# Patient Record
Sex: Female | Born: 1935 | Race: White | Hispanic: No | Smoking: Never smoker
Health system: Southern US, Community
[De-identification: ages and names within clinical notes are randomized; demographics above are authoritative.]

## PROBLEM LIST (undated history)

## (undated) DIAGNOSIS — D649 Anemia, unspecified: Secondary | ICD-10-CM

## (undated) DIAGNOSIS — I10 Essential (primary) hypertension: Secondary | ICD-10-CM

## (undated) DIAGNOSIS — E785 Hyperlipidemia, unspecified: Secondary | ICD-10-CM

## (undated) DIAGNOSIS — G43009 Migraine without aura, not intractable, without status migrainosus: Secondary | ICD-10-CM

## (undated) DIAGNOSIS — M5432 Sciatica, left side: Secondary | ICD-10-CM

## (undated) DIAGNOSIS — M1990 Unspecified osteoarthritis, unspecified site: Secondary | ICD-10-CM

## (undated) DIAGNOSIS — C801 Malignant (primary) neoplasm, unspecified: Secondary | ICD-10-CM

## (undated) DIAGNOSIS — R251 Tremor, unspecified: Secondary | ICD-10-CM

## (undated) DIAGNOSIS — E119 Type 2 diabetes mellitus without complications: Secondary | ICD-10-CM

## (undated) DIAGNOSIS — I471 Supraventricular tachycardia, unspecified: Secondary | ICD-10-CM

## (undated) DIAGNOSIS — F0390 Unspecified dementia without behavioral disturbance: Secondary | ICD-10-CM

## (undated) DIAGNOSIS — I214 Non-ST elevation (NSTEMI) myocardial infarction: Secondary | ICD-10-CM

## (undated) DIAGNOSIS — L8990 Pressure ulcer of unspecified site, unspecified stage: Secondary | ICD-10-CM

## (undated) DIAGNOSIS — G20 Parkinson's disease: Secondary | ICD-10-CM

## (undated) DIAGNOSIS — F0280 Dementia in other diseases classified elsewhere without behavioral disturbance: Secondary | ICD-10-CM

## (undated) DIAGNOSIS — G20A1 Parkinson's disease without dyskinesia, without mention of fluctuations: Secondary | ICD-10-CM

## (undated) HISTORY — DX: Parkinson's disease: G20

## (undated) HISTORY — PX: TONSILLECTOMY: SUR1361

## (undated) HISTORY — DX: Pressure ulcer of unspecified site, unspecified stage: L89.90

## (undated) HISTORY — PX: OTHER SURGICAL HISTORY: SHX169

## (undated) HISTORY — DX: Dementia in other diseases classified elsewhere without behavioral disturbance: F02.80

## (undated) HISTORY — DX: Migraine without aura, not intractable, without status migrainosus: G43.009

## (undated) HISTORY — DX: Hyperlipidemia, unspecified: E78.5

## (undated) HISTORY — DX: Type 2 diabetes mellitus without complications: E11.9

## (undated) HISTORY — PX: MOUTH SURGERY: SHX715

## (undated) HISTORY — PX: APPENDECTOMY: SHX54

## (undated) HISTORY — DX: Sciatica, left side: M54.32

## (undated) HISTORY — DX: Tremor, unspecified: R25.1

---

## 1998-08-28 ENCOUNTER — Other Ambulatory Visit: Admission: RE | Admit: 1998-08-28 | Discharge: 1998-08-28 | Payer: Self-pay | Admitting: Internal Medicine

## 1999-02-10 ENCOUNTER — Other Ambulatory Visit: Admission: RE | Admit: 1999-02-10 | Discharge: 1999-02-10 | Payer: Self-pay | Admitting: Podiatry

## 1999-09-16 ENCOUNTER — Encounter: Payer: Self-pay | Admitting: Internal Medicine

## 1999-09-16 ENCOUNTER — Encounter: Admission: RE | Admit: 1999-09-16 | Discharge: 1999-09-16 | Payer: Self-pay | Admitting: Internal Medicine

## 2000-05-26 ENCOUNTER — Encounter: Payer: Self-pay | Admitting: Internal Medicine

## 2000-05-26 ENCOUNTER — Ambulatory Visit (HOSPITAL_COMMUNITY): Admission: RE | Admit: 2000-05-26 | Discharge: 2000-05-26 | Payer: Self-pay | Admitting: Internal Medicine

## 2000-10-12 ENCOUNTER — Ambulatory Visit (HOSPITAL_COMMUNITY): Admission: RE | Admit: 2000-10-12 | Discharge: 2000-10-12 | Payer: Self-pay | Admitting: Gastroenterology

## 2001-01-17 ENCOUNTER — Encounter: Admission: RE | Admit: 2001-01-17 | Discharge: 2001-01-17 | Payer: Self-pay | Admitting: Internal Medicine

## 2001-01-17 ENCOUNTER — Encounter: Payer: Self-pay | Admitting: Internal Medicine

## 2002-09-12 ENCOUNTER — Emergency Department (HOSPITAL_COMMUNITY): Admission: EM | Admit: 2002-09-12 | Discharge: 2002-09-13 | Payer: Self-pay | Admitting: Emergency Medicine

## 2003-08-30 ENCOUNTER — Encounter: Admission: RE | Admit: 2003-08-30 | Discharge: 2003-08-30 | Payer: Self-pay | Admitting: Internal Medicine

## 2003-09-23 ENCOUNTER — Other Ambulatory Visit: Admission: RE | Admit: 2003-09-23 | Discharge: 2003-09-23 | Payer: Self-pay | Admitting: *Deleted

## 2004-01-21 ENCOUNTER — Encounter (HOSPITAL_COMMUNITY): Admission: RE | Admit: 2004-01-21 | Discharge: 2004-02-07 | Payer: Self-pay | Admitting: Orthopedic Surgery

## 2004-09-24 ENCOUNTER — Emergency Department (HOSPITAL_COMMUNITY): Admission: EM | Admit: 2004-09-24 | Discharge: 2004-09-24 | Payer: Self-pay | Admitting: *Deleted

## 2009-01-06 ENCOUNTER — Encounter: Admission: RE | Admit: 2009-01-06 | Discharge: 2009-01-06 | Payer: Self-pay | Admitting: Internal Medicine

## 2010-09-25 NOTE — Procedures (Signed)
[place]  Patient:    Catherine [name], Catherine [name]                       MRN: [id number] Proc. Date: 10/12/00 Adm. Date:  [id number] Attending:  Charna Elizabeth CC:         Merlene Laughter. Renae Gloss, M.D.   Procedure Report  DATE OF BIRTH:  Dec 30, 1927.  REFERRING PHYSICIAN:  Merlene Laughter. Renae Gloss, M.D.  PROCEDURE PERFORMED:  Colonoscopy.  ENDOSCOPIST:  Anselmo Rod, M.D.  INSTRUMENT USED:  Olympus video colonoscope.  INDICATIONS FOR PROCEDURE:  The patient is a 77 year old white female with a history of polyps rule out recurrent polyps.  PREPROCEDURE PREPARATION:  Informed consent was procured from the patient. The patient was fasted for eight hours prior to the procedure and prepped with a bottle of magnesium citrate and a gallon of NuLytely the night prior to the procedure.  PREPROCEDURE PHYSICAL:  The patient had stable vital signs.  Neck supple. Chest clear to auscultation.  S1, S2 regular.  Abdomen soft with normal abdominal bowel sounds.  DESCRIPTION OF PROCEDURE:  The patient was placed in the left lateral decubitus position and sedated with 30 mg of Demerol and 5 mg of Versed intravenously.  Once the patient was adequately sedated and maintained on low-flow oxygen and continuous cardiac monitoring, the Olympus video colonoscope was advanced from the rectum to the cecum without difficulty.  The entire colonic mucosa appeared healthy with a normal vascular pattern. No erosions, ulcerations, masses, polyps or diverticula were seen.  The appendicular orifice and the ileocecal valve were clearly visualized and photographed.  IMPRESSION:  Normal colonoscopy.  RECOMMENDATIONS:  Repeat colorectal screening is recommended in the next 10 years unless the patient were to develop any abnormal symptoms in the interim.DD:  10/12/00 TD:  10/12/00 Job: [id number]

## 2011-07-21 ENCOUNTER — Ambulatory Visit: Payer: Self-pay | Admitting: Family Medicine

## 2011-07-21 VITALS — BP 124/76 | HR 80 | Temp 98.1°F | Resp 16 | Ht 60.5 in | Wt 174.0 lb

## 2011-07-21 DIAGNOSIS — E119 Type 2 diabetes mellitus without complications: Secondary | ICD-10-CM | POA: Insufficient documentation

## 2011-07-21 DIAGNOSIS — J40 Bronchitis, not specified as acute or chronic: Secondary | ICD-10-CM

## 2011-07-21 DIAGNOSIS — R059 Cough, unspecified: Secondary | ICD-10-CM

## 2011-07-21 DIAGNOSIS — E785 Hyperlipidemia, unspecified: Secondary | ICD-10-CM | POA: Insufficient documentation

## 2011-07-21 DIAGNOSIS — R05 Cough: Secondary | ICD-10-CM

## 2011-07-21 MED ORDER — AZITHROMYCIN 250 MG PO TABS: ORAL_TABLET | ORAL | Status: AC

## 2011-07-21 MED ORDER — HYDROCODONE-HOMATROPINE 5-1.5 MG/5ML PO SYRP: 5.0000 mL | ORAL_SOLUTION | Freq: Three times a day (TID) | ORAL | Status: DC | PRN

## 2011-07-21 NOTE — Patient Instructions (Signed)
Stop the ramipril

## 2011-07-21 NOTE — Progress Notes (Signed)
78 yo woman with 3 and 1/2 months of cough, hoarseness, and sinus congestion.  Also has "pulling" in chest.  Nonproductive cough.  Has been to see her doctor several times, the last time was given cipro and shot of prednisone.  She has had an cxr which was "clear".  No past hx of pneumonia or prolonged cough in the past.  No GERD sx.  O:  NAD, alert and cooperative HEENT:  Unremarkable Neck: supple, no adenopathy Chest:  Wheezing. Heart:  Loud S2, no murmur Ext: no edema Parkinson type tremor right arm.  A:  Persistent cough.  May be related to the ramipril.  May be pertussis  P:  Z pak Hydromet Stop the ramipril.

## 2011-07-23 ENCOUNTER — Ambulatory Visit: Payer: Self-pay | Admitting: Family Medicine

## 2011-07-23 VITALS — BP 113/71 | HR 83 | Temp 98.1°F | Resp 16 | Ht 60.0 in | Wt 172.6 lb

## 2011-07-23 DIAGNOSIS — J209 Acute bronchitis, unspecified: Secondary | ICD-10-CM

## 2011-07-23 DIAGNOSIS — J40 Bronchitis, not specified as acute or chronic: Secondary | ICD-10-CM

## 2011-07-23 NOTE — Progress Notes (Signed)
78 yo woman who is here for a recheck on her cough.  She is sleeping well on the cough syrup.  The wheezing is less, but the cough continues.  Some sinus congestion persists.  She notes some mild left upper abdominal soreness today, but bowels are functioning normally.  O:  TM's old scars but no changes, Oroph clear (upper dentures), Neck supple no adenopathy NAD Chest:  No wheezes or rales Heart: S2 is loud, no murmur Ext:  No edema, tremor right arm  A:  Overall small improvement, I believe she will recover with current management  P:  Recheck in 4 days.

## 2011-07-27 ENCOUNTER — Ambulatory Visit (INDEPENDENT_AMBULATORY_CARE_PROVIDER_SITE_OTHER): Payer: Self-pay | Admitting: Family Medicine

## 2011-07-27 VITALS — BP 136/63 | HR 80 | Temp 98.3°F | Resp 16 | Ht 60.0 in | Wt 173.0 lb

## 2011-07-27 DIAGNOSIS — R05 Cough: Secondary | ICD-10-CM

## 2011-07-27 DIAGNOSIS — R059 Cough, unspecified: Secondary | ICD-10-CM

## 2011-07-27 DIAGNOSIS — J42 Unspecified chronic bronchitis: Secondary | ICD-10-CM

## 2011-07-27 MED ORDER — CLONAZEPAM 0.5 MG PO TABS: 0.2500 mg | ORAL_TABLET | Freq: Every evening | ORAL | Status: AC | PRN

## 2011-07-27 MED ORDER — METHYLPREDNISOLONE 4 MG PO TABS: ORAL_TABLET | ORAL | Status: DC

## 2011-07-27 NOTE — Progress Notes (Signed)
78 yo woman with persistent cough and wheeze.  No fevers. Patient notes tremor R hand intermittently and anxiety, usually in evening, which worsens the tremor on the right hand.  O:  HEENT unremarkable Intermittent coarse 3/sec right forearm tremor Chest:  Few rales right sided, faint wheeze right.  A:  Persisting bronchitis Mild rue tremor Anxiety  P:  Clonazepam .25 mg qhs Medrol 4 mg ii daily for 2 days, i daily for 3 days.

## 2011-07-30 ENCOUNTER — Telehealth: Payer: Self-pay

## 2011-07-30 NOTE — Telephone Encounter (Signed)
Pt is still coughing and not feeling any better she wants to know if she needs she said she is only following Dr Armond Hang orders he told her to call, if she needs to come out she wants to come out before it gets dark.

## 2011-07-31 ENCOUNTER — Other Ambulatory Visit: Payer: Self-pay | Admitting: Family Medicine

## 2011-07-31 NOTE — Telephone Encounter (Signed)
Patient states she was told to call Dr. Milus Glazier. She called yesterday and never heard back. Please call [phone]

## 2011-08-01 NOTE — Telephone Encounter (Signed)
PLEASE ADVISE.

## 2011-08-01 NOTE — Telephone Encounter (Signed)
[facility] ADVISING PT OF NOTE

## 2011-08-01 NOTE — Telephone Encounter (Signed)
I have been extremely busy.  Please have her come back either this afternoon to see another provider, or tomorrow morning to see me.  I have not been feeling well myself and need to go home now.

## 2012-09-20 ENCOUNTER — Encounter: Payer: Self-pay | Admitting: Neurology

## 2012-09-20 ENCOUNTER — Ambulatory Visit (INDEPENDENT_AMBULATORY_CARE_PROVIDER_SITE_OTHER): Payer: Medicare Other | Admitting: Neurology

## 2012-09-20 VITALS — BP 144/68 | HR 90 | Ht 61.0 in | Wt 174.0 lb

## 2012-09-20 DIAGNOSIS — G20A1 Parkinson's disease without dyskinesia, without mention of fluctuations: Secondary | ICD-10-CM

## 2012-09-20 DIAGNOSIS — G20 Parkinson's disease: Secondary | ICD-10-CM

## 2012-09-20 HISTORY — DX: Parkinson's disease: G20

## 2012-09-20 HISTORY — DX: Parkinson's disease without dyskinesia, without mention of fluctuations: G20.A1

## 2012-09-20 NOTE — Progress Notes (Signed)
Reason for visit: Tremor  Catherine [name] is a 79 y.o. female  History of present illness:  Catherine [name] is an 79 year old right-handed white female with a history of a tremor that is consistent with Parkinson's disease. The tremor mainly affects the right arm. The patient will have good days and bad days with the tremor. Over the last one year, the patient does not believe that there has been any significant changes in her underlying functional level. The patient has had some sciatica issues involving the left leg, but this has improved with physical therapy. The patient denies any falls. The patient indicates that when she is actively doing something with the right arm, the tremor disappears. In the past, the patient indicates that she has had a skin rash to Sinemet and Azilect. The patient is on no medications for Parkinson's disease at this time. The patient returns for an evaluation.  Past Medical History  Diagnosis Date  . Tremor   . Paralysis agitans 09/20/2012  . Sciatica of left side   . Obese   . Diabetes mellitus without complication   . Dyslipidemia   . Migraine without aura, without mention of intractable migraine without mention of status migrainosus     Past Surgical History  Procedure Laterality Date  . Appendectomy    . Basal cell carcinoma resection      Forehead  . Mouth surgery    . Tonsillectomy    . Arthroscopic surgery      Left knee  . Trigger finger surgery      Bilateral thumb    Family History  Problem Relation Age of Onset  . Heart attack Father     Social history:  reports that she has never smoked. She does not have any smokeless tobacco history on file. She reports that she does not drink alcohol or use illicit drugs.  Medications:  Current Outpatient Prescriptions on File Prior to Visit  Medication Sig Dispense Refill  . simvastatin (ZOCOR) 40 MG tablet Take 40 mg by mouth every evening.       No current facility-administered medications on  file prior to visit.    Allergies:  Allergies  Allergen Reactions  . Sulfa Antibiotics Rash  . Azilect (Rasagiline) Rash  . Fenofibrate Rash  . Sinemet (Carbidopa-Levodopa) Rash    ROS:  Out of a complete 14 system review of symptoms, the patient complains only of the following symptoms, and all other reviewed systems are negative.   Tremor   Blood pressure 144/68, pulse 90, height 5\' 1"  (1.549 m), weight 174 lb (78.926 kg).  Physical Exam  General: The patient is alert and cooperative at the time of the examination. The patient is moderately obese.  Skin: Extremities are without significant edema.  Neurologic Exam  Cranial nerves: Facial symmetry is present. There is good sensation of the face to pinprick and soft touch bilaterally. The strength of the facial muscles and the muscles to head turning and shoulder shrug are normal bilaterally. Speech is well enunciated, no aphasia or dysarthria is noted. Extraocular movements are full. Visual fields are full. Masking of the face is seen.  Motor: The motor testing reveals 5 over 5 strength of all 4 extremities. Good symmetric motor tone is noted throughout.  Coordination: Cerebellar testing reveals good finger-nose-finger and heel-to-shin bilaterally. The patient has a resting tremor affecting the right upper extremity.   Gait and station: Gait is normal. Decreased arm swing is noted bilaterally, and a tremor is noted  with the right upper extremity while walking. The patient is able to arise from a seated position with the arms crossed.  Tandem gait is normal. Romberg is negative. No drift is seen.  Reflexes: Deep tendon reflexes are symmetric and normal bilaterally.    Assessment/Plan:   One. Probable Parkinson's disease  The patient likely has Parkinson's disease primarily with right upper extremity tremor. The patient however appears to be progressing very slowly. The patient indicates that she has allergies to Sinemet,  and therefore treatment of Parkinson's disease in the future may be difficult. The patient will be followed conservatively. The patient will come back in about one year.   Marlan Palau MD 09/20/2012 7:41 PM  [facility] [address] [place], [place] [address]  Phone [phone] Fax [phone]

## 2013-01-11 ENCOUNTER — Ambulatory Visit: Payer: Self-pay | Admitting: Cardiology

## 2013-01-11 ENCOUNTER — Other Ambulatory Visit: Payer: Self-pay | Admitting: *Deleted

## 2013-01-11 ENCOUNTER — Encounter: Payer: Self-pay | Admitting: Internal Medicine

## 2013-01-11 ENCOUNTER — Ambulatory Visit (INDEPENDENT_AMBULATORY_CARE_PROVIDER_SITE_OTHER): Payer: Medicare Other | Admitting: Internal Medicine

## 2013-01-11 VITALS — BP 140/74 | HR 62 | Ht 60.0 in | Wt 174.7 lb

## 2013-01-11 DIAGNOSIS — R002 Palpitations: Secondary | ICD-10-CM

## 2013-01-11 NOTE — Progress Notes (Signed)
OFFICE NOTE  Chief Complaint:  Heart fluttering  Primary Care Physician: Eartha Inch, MD  HPI:  Catherine [name] is a pleasant 79 year old female with a history of diabetes, dyslipidemia, and hypertension, as well as upper extremity tremor and diagnosis of paralysis agitans. She sees Dr. Anne Hahn for this. She was previously tried on Sinemet and Azliect, but had side effects from those medications. She reports that she's been having fluttering in her chest for several years, but it has become worse recently. The symptoms are present nearly every day and are very short lived. She is concerned that they may be due to stress, but is also concerned about heart disease due to her family history. She was previously taking Inderal, which may have been helping her palpitations and her tremor to some extent. For unknown reasons, she is not currently taking that medication. She denies any chest pain or worsening shortness of breath with exertion.  PMHx:  Past Medical History  Diagnosis Date  . Tremor   . Paralysis agitans 09/20/2012  . Sciatica of left side   . Obese   . Diabetes mellitus without complication   . Dyslipidemia   . Migraine without aura, without mention of intractable migraine without mention of status migrainosus     Past Surgical History  Procedure Laterality Date  . Appendectomy    . Basal cell carcinoma resection      Forehead  . Mouth surgery    . Tonsillectomy    . Arthroscopic surgery      Left knee  . Trigger finger surgery      Bilateral thumb    FAMHx:  Family History  Problem Relation Age of Onset  . Heart attack Father     not certain of COD  . Rheum arthritis Mother   . Hypertension Paternal Grandfather   . Stroke Paternal Grandfather   . Cirrhosis Paternal Grandmother     SOCHx:   reports that she has never smoked. She has never used smokeless tobacco. She reports that she does not drink alcohol or use illicit drugs.  ALLERGIES:  Allergies    Allergen Reactions  . Sulfa Antibiotics Rash  . Azilect [Rasagiline] Rash  . Fenofibrate Rash  . Sinemet [Carbidopa-Levodopa] Rash    ROS: A comprehensive review of systems was negative except for: Cardiovascular: positive for palpitations Neurological: positive for gait problems and tremors  HOME MEDS: Current Outpatient Prescriptions  Medication Sig Dispense Refill  . Alogliptin-Metformin HCl (KAZANO) 12.09-998 MG TABS Take 1 tablet by mouth daily.       Marland Kitchen aspirin 81 MG chewable tablet Chew 81 mg by mouth daily.      Marland Kitchen LORazepam (ATIVAN) 0.5 MG tablet Take 0.5 mg by mouth 2 (two) times daily.      . Multiple Vitamin (MULTIVITAMIN) tablet Take 1 tablet by mouth. Takes occasionally      . RAMIPRIL PO Take 2.5 mg by mouth daily.       . propranolol (INDERAL) 10 MG tablet Take 10 mg by mouth 3 (three) times daily.      . simvastatin (ZOCOR) 40 MG tablet Take 20 mg by mouth every evening.        No current facility-administered medications for this visit.    LABS/IMAGING: No results found for this or any previous visit (from the past 48 hour(s)). No results found.  VITALS: BP 140/74  Pulse 62  Ht 5' (1.524 m)  Wt 174 lb 11.2 oz (79.243 kg)  BMI 34.12  kg/m2  EXAM: General appearance: alert, no distress and possible masked facies Neck: no adenopathy, no carotid bruit, no JVD, supple, symmetrical, trachea midline and thyroid not enlarged, symmetric, no tenderness/mass/nodules Lungs: clear to auscultation bilaterally Heart: regular rate and rhythm, S1, S2 normal, no murmur, click, rub or gallop Abdomen: soft, non-tender; bowel sounds normal; no masses,  no organomegaly and obese Extremities: edema trace edema R>L, reticular veins Pulses: 2+ and symmetric Skin: Skin color, texture, turgor normal. No rashes or lesions Neurologic: Grossly normal  EKG: Sinus rhythm at 62  ASSESSMENT: 1. Palpitations 2. Paralysis agitans (AKA Parkinson's disorder) 3. Dyslipidemia -  controlled 4. Hypertension - controlled  PLAN: 1.   Mrs. Flight has been having some palpitations for years. She was previously on Inderal possibly for her tremor and/or palpitations, but for some reason he stopped taking this. She reports her palpitations occurred nearly every day and thinks they may be due to stress. I recommended a seven-day event monitor. Based on these findings, we may recommend restarting her Inderal. She does not believe she has Parkinson's disease, however her symptoms do suggest this. It is not common for palpitations to coexist with that disorder. We'll plan to see her back in 2 weeks to review the results of her monitor.  Chrystie Nose, MD, [facility] Attending Cardiologist [facility]  HILTY,Kenneth C 01/11/2013, 11:52 AM

## 2013-01-11 NOTE — Patient Instructions (Addendum)
Your physician has recommended that you wear an event monitor. Event monitors are medical devices that record the heart's electrical activity. Doctors most often [place] these monitors to diagnose arrhythmias. Arrhythmias are problems with the speed or rhythm of the heartbeat. The monitor is a small, portable device. You can wear one while you do your normal daily activities. This is usually used to diagnose what is causing palpitations/syncope (passing out). You will wear this for 7 days.   Dr Rennis Golden would like you to follow up in about 2-3 weeks, after you wear your monitor.

## 2013-01-15 ENCOUNTER — Encounter: Payer: Self-pay | Admitting: Internal Medicine

## 2013-01-25 ENCOUNTER — Encounter: Payer: Self-pay | Admitting: Internal Medicine

## 2013-01-25 ENCOUNTER — Ambulatory Visit (INDEPENDENT_AMBULATORY_CARE_PROVIDER_SITE_OTHER): Payer: Medicare Other | Admitting: Internal Medicine

## 2013-01-25 VITALS — BP 116/60 | HR 76 | Ht 60.0 in | Wt 172.2 lb

## 2013-01-25 DIAGNOSIS — R002 Palpitations: Secondary | ICD-10-CM

## 2013-01-25 DIAGNOSIS — I471 Supraventricular tachycardia: Secondary | ICD-10-CM

## 2013-01-25 MED ORDER — PROPRANOLOL HCL ER 60 MG PO CP24: 60.0000 mg | ORAL_CAPSULE | Freq: Every day | ORAL | Status: DC

## 2013-01-25 NOTE — Patient Instructions (Addendum)
Your physician has recommended you make the following change in your medication: START PROPRANOLOL 60mg  DAILY.   Follow up with Dr. Rennis Golden as needed.

## 2013-01-25 NOTE — Progress Notes (Signed)
OFFICE NOTE  Chief Complaint:  Heart fluttering  Primary Care Physician: Catherine Inch, MD  HPI:  Catherine [name] is a pleasant 79 year old female with a history of diabetes, dyslipidemia, and hypertension, as well as upper extremity tremor and diagnosis of paralysis agitans. She sees Dr. Anne [name] for this. She was previously tried on Sinemet and Azliect, but had side effects from those medications. She reports that she's been having fluttering in her chest for several years, but it has become worse recently. The symptoms are present nearly every day and are very short lived. She is concerned that they may be due to stress, but is also concerned about heart disease due to her family history. She was previously taking Inderal, which may have been helping her palpitations and her tremor to some extent. For unknown reasons, she is not currently taking that medication. She denies any chest pain or worsening shortness of breath with exertion. She underwent event monitoring from 01/11/2013 to 01/17/2013. There were numerous episodes of palpitations which do not correlate with extra beats. However, she did have one episode with a skipped beat and shortness of breath on 01/14/2013 which was associated with a brief 4-5 beat atrial run.  PMHx:  Past Medical History  Diagnosis Date  . Tremor   . Paralysis agitans 09/20/2012  . Sciatica of left side   . Obese   . Diabetes mellitus without complication   . Dyslipidemia   . Migraine without aura, without mention of intractable migraine without mention of status migrainosus     Past Surgical History  Procedure Laterality Date  . Appendectomy    . Basal cell carcinoma resection      Forehead  . Mouth surgery    . Tonsillectomy    . Arthroscopic surgery      Left knee  . Trigger finger surgery      Bilateral thumb    FAMHx:  Family History  Problem Relation Age of Onset  . Heart attack Father     not certain of COD  . Rheum arthritis  Mother   . Hypertension Paternal Grandfather   . Stroke Paternal Grandfather   . Cirrhosis Paternal Grandmother     SOCHx:   reports that she has never smoked. She has never used smokeless tobacco. She reports that she does not drink alcohol or use illicit drugs.  ALLERGIES:  Allergies  Allergen Reactions  . Sulfa Antibiotics Rash  . Azilect [Rasagiline] Rash  . Fenofibrate Rash  . Sinemet [Carbidopa-Levodopa] Rash    ROS: A comprehensive review of systems was negative except for: Cardiovascular: positive for palpitations Neurological: positive for gait problems and tremors  HOME MEDS: Current Outpatient Prescriptions  Medication Sig Dispense Refill  . propranolol ER (INDERAL LA) 60 MG 24 hr capsule Take 1 capsule (60 mg total) by mouth daily.  30 capsule  11  . Alogliptin-Metformin HCl (KAZANO) 12.09-998 MG TABS Take 1 tablet by mouth daily.       Marland Kitchen aspirin 81 MG chewable tablet Chew 81 mg by mouth daily.      Marland Kitchen LORazepam (ATIVAN) 0.5 MG tablet Take 0.5 mg by mouth 2 (two) times daily.      . Multiple Vitamin (MULTIVITAMIN) tablet Take 1 tablet by mouth. Takes occasionally      . RAMIPRIL PO Take 2.5 mg by mouth daily.       . simvastatin (ZOCOR) 40 MG tablet Take 20 mg by mouth every evening.  No current facility-administered medications for this visit.    LABS/IMAGING: No results found for this or any previous visit (from the past 48 hour(s)). No results found.  VITALS: BP 116/60  Pulse 76  Ht 5' (1.524 m)  Wt 172 lb 3.2 oz (78.109 kg)  BMI 33.63 kg/m2  EXAM: Deferred  EKG: deferred  ASSESSMENT: 1. Palpitations - secondary to PAT 2. Paralysis agitans (AKA Parkinson's disorder) 3. Dyslipidemia - controlled 4. Hypertension - controlled  PLAN: 1.   Catherine [name] has been having some palpitations for years. Her monitor demonstrates brief runs of PAT. Although she is having symptoms not associated with arrythmia on the monitor, I suspect she has both  atrial arrhythmias which are short-lived as well as symptoms related to her Parkinson's. Nevertheless, this should be easily mask with a rate of blocker. She previously took short-acting Inderal but stopped for unclear reasons. I think she would do well and be more compliant with a long-acting low-dose beta blocker such as propranolol ER. We will going to start this and she should be aware of any untoward side effects. Plan to followup as needed.  Catherine Nose, MD, [facility] Attending Cardiologist [facility]  Catherine [name] 01/25/2013, 2:49 PM

## 2013-09-20 ENCOUNTER — Ambulatory Visit (INDEPENDENT_AMBULATORY_CARE_PROVIDER_SITE_OTHER): Payer: Medicare Other | Admitting: Neurology

## 2013-09-20 ENCOUNTER — Encounter: Payer: Self-pay | Admitting: Neurology

## 2013-09-20 ENCOUNTER — Encounter (INDEPENDENT_AMBULATORY_CARE_PROVIDER_SITE_OTHER): Payer: Self-pay

## 2013-09-20 VITALS — BP 149/75 | HR 81 | Wt 160.0 lb

## 2013-09-20 DIAGNOSIS — G20 Parkinson's disease: Secondary | ICD-10-CM

## 2013-09-20 MED ORDER — PRAMIPEXOLE DIHYDROCHLORIDE 0.125 MG PO TABS
0.1250 mg | ORAL_TABLET | Freq: Three times a day (TID) | ORAL | Status: DC
Start: 2013-09-20 — End: 2013-11-05

## 2013-09-20 NOTE — Progress Notes (Signed)
Reason for visit: Parkinson's disease  Catherine [name] is an 80 y.o. female  History of present illness:  Catherine [name] is an 80 year old right-handed white female with a history of Parkinson's disease primarily with right-sided features. Over the last year, she has gradually worsened with her parkinsonism. She indicates that she fell 2 weeks ago, and she was unable to get up by herself. She had to call a neighbor to help her get up on her feet again. She is not using a cane for ambulation. She continues to have a prominent resting tremor on the right arm. She lives alone, and she is doing all of her activities of daily living, but she indicates that it is taking more time to complete these tasks. She denies any problems with choking with swallowing. In the past, the patient indicates that Azilect and Sinemet resulted in a skin rash. The patient comes to this office for further evaluation.  Past Medical History  Diagnosis Date  . Tremor   . Paralysis agitans 09/20/2012  . Sciatica of left side   . Obese   . Diabetes mellitus without complication   . Dyslipidemia   . Migraine without aura, without mention of intractable migraine without mention of status migrainosus     Past Surgical History  Procedure Laterality Date  . Appendectomy    . Basal cell carcinoma resection      Forehead  . Mouth surgery    . Tonsillectomy    . Arthroscopic surgery      Left knee  . Trigger finger surgery      Bilateral thumb    Family History  Problem Relation Age of Onset  . Heart attack Father     not certain of COD  . Rheum arthritis Mother   . Hypertension Paternal Grandfather   . Stroke Paternal Grandfather   . Cirrhosis Paternal Grandmother     Social history:  reports that she has never smoked. She has never used smokeless tobacco. She reports that she does not drink alcohol or use illicit drugs.    Allergies  Allergen Reactions  . Sulfa Antibiotics Rash  . Azilect [Rasagiline]  Rash  . Fenofibrate Rash  . Sinemet [Carbidopa-Levodopa] Rash    Medications:  Current Outpatient Prescriptions on File Prior to Visit  Medication Sig Dispense Refill  . Alogliptin-Metformin HCl (KAZANO) 12.09-998 MG TABS Take 1 tablet by mouth daily.       Marland Kitchen aspirin 81 MG chewable tablet Chew 81 mg by mouth daily.      . Multiple Vitamin (MULTIVITAMIN) tablet Take 1 tablet by mouth. Takes occasionally      . RAMIPRIL PO Take 2.5 mg by mouth daily.       Marland Kitchen LORazepam (ATIVAN) 0.5 MG tablet Take 0.5 mg by mouth 2 (two) times daily.      . simvastatin (ZOCOR) 40 MG tablet Take 20 mg by mouth every evening.        No current facility-administered medications on file prior to visit.    ROS:  Out of a complete 14 system review of symptoms, the patient complains only of the following symptoms, and all other reviewed systems are negative.  Runny nose Nausea, vomiting  Blood pressure 149/75, pulse 81, weight 160 lb (72.576 kg).  Physical Exam  General: The patient is alert and cooperative at the time of the examination. Masking of the face is seen.  Skin: No significant peripheral edema is noted.   Neurologic Exam  Mental  status: The patient is oriented x 3.  Cranial nerves: Facial symmetry is present. Speech is normal, no aphasia or dysarthria is noted. Extraocular movements are full. Some lid lag is noted. Visual fields are full.  Motor: The patient has good strength in all 4 extremities.  Sensory examination: Soft touch sensation is symmetric on the face, arms, and legs.  Coordination: The patient has good finger-nose-finger and heel-to-shin bilaterally. The patient has a prominent resting tremor of the right upper extremity.  Gait and station: The patient has difficulty arising from a chair. Once up, and she is able to ambulate independently, but the arms do not swing well on either side, the right arm is in partial flexion with a resting tremor while walking. Tandem gait is  unsteady. Romberg is negative. No drift is seen.  Reflexes: Deep tendon reflexes are symmetric.   Assessment/Plan:  1. Parkinson's disease  The patient is progressing slowly over time. She has primarily right-sided features. The patient indicates that she has an allergy to Sinemet, but this medication may need to be retried in the future. The patient will be given a trial on Mirapex in low dose. If she is tolerating this, we will gradually increase the dose. The patient will be considered for a referral to one of our movement disorder specialists. Treatment of Parkinson's disease without Sinemet will prove to be quite difficult.  Jill Alexanders MD 09/20/2013 7:35 PM  [facility] [address] [place], [place] [address]  Phone [phone] Fax [phone]

## 2013-09-20 NOTE — Patient Instructions (Signed)

## 2013-09-25 ENCOUNTER — Telehealth: Payer: Self-pay | Admitting: Neurology

## 2013-09-25 NOTE — Telephone Encounter (Signed)
Pt called states she has ?'s concerning her medication pramipexole (MIRAPEX) 0.125 MG tablet, please call pt concerning this matter. Thanks

## 2013-09-25 NOTE — Telephone Encounter (Signed)
I called the patient back.  She is very concerned about the side effects she has read about Mirapex.  Says she is on BP meds and she takes 81mg  aspirin, both of which make her sleep all day long.  She is concerned she may have a sensitivity to Mirpaex and would like to speak to the provider regarding this.  Please advise.  Thank you.

## 2013-09-25 NOTE — Telephone Encounter (Signed)
I called patient. She called about being concerned about potential side effects or Mirapex. She was started on a 0.125 mg tablet. I have indicated that she should not withhold taking her medication because she might have side effects. If she does have side effects, the medication can be stopped or altered. She may start 1 tablet a day for 5 days, then take 1 tablet twice a day for 5 days, and then go to 1 tablet 3 times daily. If she has further questions, she will call back.

## 2013-11-05 ENCOUNTER — Encounter (HOSPITAL_COMMUNITY): Payer: Self-pay | Admitting: Pharmacy Technician

## 2013-11-13 NOTE — Patient Instructions (Signed)
Your procedure is scheduled on:  Monday, 11/19/13  Report to [facility] at     0830   AM.  Call this number if you have problems the morning of surgery: [phone]   Remember:   Do not eat or drink   After Midnight.  Take these medicines the morning of surgery with A SIP OF WATER: ramipril   Do not wear jewelry, make-up or nail polish.  Do not wear lotions, powders, or perfumes. You may wear deodorant.  Do not bring valuables to the hospital.  Contacts, dentures or bridgework may not be worn into surgery.     Patients discharged the day of surgery will not be allowed to drive home.  Name and phone number of your driver: driver  Special Instructions: Use eye drops as directed.   Please read over the following fact sheets that you were given: Pain Booklet, Anesthesia Post-op Instructions and Care and Recovery After Surgery    Cataract Surgery  A cataract is a clouding of the lens of the eye. When a lens becomes cloudy, vision is reduced based on the degree and nature of the clouding. Surgery may be needed to improve vision. Surgery removes the cloudy lens and usually replaces it with a substitute lens (intraocular lens, IOL). LET YOUR EYE DOCTOR KNOW ABOUT:  Allergies to food or medicine.   Medicines taken including herbs, eyedrops, over-the-counter medicines, and creams.   Use of steroids (by mouth or creams).   Previous problems with anesthetics or numbing medicine.   History of bleeding problems or blood clots.   Previous surgery.   Other health problems, including diabetes and kidney problems.   Possibility of pregnancy, if this applies.  RISKS AND COMPLICATIONS  Infection.   Inflammation of the eyeball (endophthalmitis) that can spread to both eyes (sympathetic ophthalmia).   Poor wound healing.   If an IOL is inserted, it can later fall out of proper position. This is very uncommon.   Clouding of the part of your eye that holds an IOL in place. This is called an  "after-cataract." These are uncommon, but easily treated.  BEFORE THE PROCEDURE  Do not eat or drink anything except small amounts of water for 8 to 12 before your surgery, or as directed by your caregiver.   Unless you are told otherwise, continue any eyedrops you have been prescribed.   Talk to your primary caregiver about all other medicines that you take (both prescription and non-prescription). In some cases, you may need to stop or change medicines near the time of your surgery. This is most important if you are taking blood-thinning medicine.Do not stop medicines unless you are told to do so.   Arrange for someone to drive you to and from the procedure.   Do not put contact lenses in either eye on the day of your surgery.  PROCEDURE There is more than one method for safely removing a cataract. Your doctor can explain the differences and help determine which is best for you. Phacoemulsification surgery is the most common form of cataract surgery.  An injection is given behind the eye or eyedrops are given to make this a painless procedure.   A small cut (incision) is made on the edge of the clear, dome-shaped surface that covers the front of the eye (cornea).   A tiny probe is painlessly inserted into the eye. This device gives off ultrasound waves that soften and break up the cloudy center of the lens. This makes it  easier for the cloudy lens to be removed by suction.   An IOL may be implanted.   The normal lens of the eye is covered by a clear capsule. Part of that capsule is intentionally left in the eye to support the IOL.   Your surgeon may or may not use stitches to close the incision.  There are other forms of cataract surgery that require a larger incision and stiches to close the eye. This approach is taken in cases where the doctor feels that the cataract cannot be easily removed using phacoemulsification. AFTER THE PROCEDURE  When an IOL is implanted, it does not need  care. It becomes a permanent part of your eye and cannot be seen or felt.   Your doctor will schedule follow-up exams to check on your progress.   Review your other medicines with your doctor to see which can be resumed after surgery.   Use eyedrops or take medicine as prescribed by your doctor.  Document Released: 04/15/2011 Document Reviewed: 04/12/2011 [facility] Patient Information 2012 [place].  PATIENT INSTRUCTIONS POST-ANESTHESIA  IMMEDIATELY FOLLOWING SURGERY:  Do not drive or operate machinery for the first twenty four hours after surgery.  Do not make any important decisions for twenty four hours after surgery or while taking narcotic pain medications or sedatives.  If you develop intractable nausea and vomiting or a severe headache please notify your doctor immediately.  FOLLOW-UP:  Please make an appointment with your surgeon as instructed. You do not need to follow up with anesthesia unless specifically instructed to do so.  WOUND CARE INSTRUCTIONS (if applicable):  Keep a dry clean dressing on the anesthesia/puncture wound site if there is drainage.  Once the wound has quit draining you may leave it open to air.  Generally you should leave the bandage intact for twenty four hours unless there is drainage.  If the epidural site drains for more than 36-48 hours please call the anesthesia department.  QUESTIONS?:  Please feel free to call your physician or the hospital operator if you have any questions, and they will be happy to assist you.

## 2013-11-14 ENCOUNTER — Encounter (HOSPITAL_COMMUNITY)
Admission: RE | Admit: 2013-11-14 | Discharge: 2013-11-14 | Disposition: A | Discharge status: Home / Self Care | Payer: Medicare Other | Source: Ambulatory Visit | Attending: Ophthalmology | Admitting: Ophthalmology

## 2013-11-14 ENCOUNTER — Encounter (HOSPITAL_COMMUNITY): Payer: Self-pay

## 2013-11-14 DIAGNOSIS — Z01812 Encounter for preprocedural laboratory examination: Secondary | ICD-10-CM | POA: Insufficient documentation

## 2013-11-14 HISTORY — DX: Malignant (primary) neoplasm, unspecified: C80.1

## 2013-11-14 HISTORY — DX: Unspecified osteoarthritis, unspecified site: M19.90

## 2013-11-14 HISTORY — DX: Essential (primary) hypertension: I10

## 2013-11-14 LAB — BASIC METABOLIC PANEL
Anion gap: 13 (ref 5–15)
BUN: 17 mg/dL (ref 6–23)
CALCIUM: 9.6 mg/dL (ref 8.4–10.5)
CHLORIDE: 103 meq/L (ref 96–112)
CO2: 28 mEq/L (ref 19–32)
Creatinine, Ser: 0.7 mg/dL (ref 0.50–1.10)
GFR calc Af Amer: >90 mL/min (ref >90)
GFR calc non Af Amer: 78 mL/min — ABNORMAL LOW (ref >90)
GLUCOSE: 88 mg/dL (ref 70–99)
Potassium: 4.6 mEq/L (ref 3.7–5.3)
SODIUM: 144 meq/L (ref 137–147)

## 2013-11-14 LAB — HEMOGLOBIN AND HEMATOCRIT, BLOOD
HCT: 41.8 % (ref 36.0–46.0)
Hemoglobin: 14 g/dL (ref 12.0–15.0)

## 2013-11-14 NOTE — Pre-Procedure Instructions (Signed)
Patient given information to sign up for my chart at home. 

## 2013-11-16 MED ORDER — PHENYLEPHRINE HCL 2.5 % OP SOLN
OPHTHALMIC | Status: AC
  Filled 2013-11-16: qty 15

## 2013-11-16 MED ORDER — NEOMYCIN-POLYMYXIN-DEXAMETH 3.5-10000-0.1 OP SUSP
OPHTHALMIC | Status: AC
  Filled 2013-11-16: qty 5

## 2013-11-16 MED ORDER — CYCLOPENTOLATE-PHENYLEPHRINE OP SOLN OPTIME - NO CHARGE
OPHTHALMIC | Status: AC
  Filled 2013-11-16: qty 2

## 2013-11-16 MED ORDER — TETRACAINE HCL 0.5 % OP SOLN
OPHTHALMIC | Status: AC
  Filled 2013-11-16: qty 2

## 2013-11-16 MED ORDER — LIDOCAINE HCL (PF) 1 % IJ SOLN
INTRAMUSCULAR | Status: AC
  Filled 2013-11-16: qty 2

## 2013-11-16 MED ORDER — LIDOCAINE HCL 3.5 % OP GEL
OPHTHALMIC | Status: AC
  Filled 2013-11-16: qty 1

## 2013-11-19 ENCOUNTER — Encounter (HOSPITAL_COMMUNITY)
Admission: RE | Disposition: A | Discharge status: Home / Self Care | Payer: Self-pay | Source: Ambulatory Visit | Attending: Ophthalmology

## 2013-11-19 ENCOUNTER — Encounter (HOSPITAL_COMMUNITY): Payer: Medicare Other | Admitting: Anesthesiology

## 2013-11-19 ENCOUNTER — Encounter (HOSPITAL_COMMUNITY): Payer: Self-pay | Admitting: *Deleted

## 2013-11-19 ENCOUNTER — Ambulatory Visit (HOSPITAL_COMMUNITY)
Admission: RE | Admit: 2013-11-19 | Discharge: 2013-11-19 | Disposition: A | Discharge status: Home / Self Care | Payer: Medicare Other | Source: Ambulatory Visit | Attending: Ophthalmology | Admitting: Ophthalmology

## 2013-11-19 ENCOUNTER — Ambulatory Visit (HOSPITAL_COMMUNITY): Payer: Medicare Other | Admitting: Anesthesiology

## 2013-11-19 DIAGNOSIS — I10 Essential (primary) hypertension: Secondary | ICD-10-CM | POA: Insufficient documentation

## 2013-11-19 DIAGNOSIS — H52209 Unspecified astigmatism, unspecified eye: Secondary | ICD-10-CM | POA: Insufficient documentation

## 2013-11-19 DIAGNOSIS — E119 Type 2 diabetes mellitus without complications: Secondary | ICD-10-CM | POA: Insufficient documentation

## 2013-11-19 DIAGNOSIS — E7800 Pure hypercholesterolemia, unspecified: Secondary | ICD-10-CM | POA: Insufficient documentation

## 2013-11-19 DIAGNOSIS — Z85828 Personal history of other malignant neoplasm of skin: Secondary | ICD-10-CM | POA: Insufficient documentation

## 2013-11-19 DIAGNOSIS — Z79899 Other long term (current) drug therapy: Secondary | ICD-10-CM | POA: Insufficient documentation

## 2013-11-19 DIAGNOSIS — H2510 Age-related nuclear cataract, unspecified eye: Secondary | ICD-10-CM | POA: Insufficient documentation

## 2013-11-19 HISTORY — PX: CATARACT EXTRACTION W/PHACO: SHX586

## 2013-11-19 LAB — GLUCOSE, CAPILLARY: GLUCOSE-CAPILLARY: 113 mg/dL — AB (ref 70–99)

## 2013-11-19 SURGERY — PHACOEMULSIFICATION, CATARACT, WITH IOL INSERTION
Anesthesia: Monitor Anesthesia Care | Site: Eye | Laterality: Right

## 2013-11-19 MED ORDER — LACTATED RINGERS IV SOLN
INTRAVENOUS | Status: DC
  Administered 2013-11-19: 09:00:00 via INTRAVENOUS

## 2013-11-19 MED ORDER — LIDOCAINE HCL (PF) 1 % IJ SOLN
INTRAMUSCULAR | Status: DC | PRN
  Administered 2013-11-19: .5 mL

## 2013-11-19 MED ORDER — FENTANYL CITRATE 0.05 MG/ML IJ SOLN
25.0000 ug | INTRAMUSCULAR | Status: AC
  Administered 2013-11-19 (×2): 25 ug via INTRAVENOUS

## 2013-11-19 MED ORDER — PHENYLEPHRINE HCL 2.5 % OP SOLN
1.0000 [drp] | OPHTHALMIC | Status: AC
  Administered 2013-11-19 (×3): 1 [drp] via OPHTHALMIC

## 2013-11-19 MED ORDER — CYCLOPENTOLATE-PHENYLEPHRINE 0.2-1 % OP SOLN
1.0000 [drp] | OPHTHALMIC | Status: AC
Start: 2013-11-19 — End: 2013-11-19
  Administered 2013-11-19 (×3): 1 [drp] via OPHTHALMIC

## 2013-11-19 MED ORDER — PROVISC 10 MG/ML IO SOLN
INTRAOCULAR | Status: DC | PRN
  Administered 2013-11-19: 0.85 mL via INTRAOCULAR

## 2013-11-19 MED ORDER — MIDAZOLAM HCL 2 MG/2ML IJ SOLN
1.0000 mg | INTRAMUSCULAR | Status: DC | PRN
  Administered 2013-11-19: 2 mg via INTRAVENOUS

## 2013-11-19 MED ORDER — MIDAZOLAM HCL 2 MG/2ML IJ SOLN
INTRAMUSCULAR | Status: AC
  Filled 2013-11-19: qty 2

## 2013-11-19 MED ORDER — FENTANYL CITRATE 0.05 MG/ML IJ SOLN
INTRAMUSCULAR | Status: AC
  Filled 2013-11-19: qty 2

## 2013-11-19 MED ORDER — NEOMYCIN-POLYMYXIN-DEXAMETH 3.5-10000-0.1 OP SUSP
OPHTHALMIC | Status: DC | PRN
  Administered 2013-11-19: 2 [drp] via OPHTHALMIC

## 2013-11-19 MED ORDER — BSS IO SOLN
INTRAOCULAR | Status: DC | PRN
  Administered 2013-11-19: 15 mL via INTRAOCULAR

## 2013-11-19 MED ORDER — EPINEPHRINE HCL 1 MG/ML IJ SOLN
INTRAMUSCULAR | Status: AC
Start: 2013-11-19 — End: 2013-11-19
  Filled 2013-11-19: qty 1

## 2013-11-19 MED ORDER — TETRACAINE HCL 0.5 % OP SOLN
1.0000 [drp] | OPHTHALMIC | Status: AC
  Administered 2013-11-19 (×3): 1 [drp] via OPHTHALMIC

## 2013-11-19 MED ORDER — EPINEPHRINE HCL 1 MG/ML IJ SOLN
INTRAOCULAR | Status: DC | PRN
  Administered 2013-11-19: 09:00:00

## 2013-11-19 MED ORDER — LIDOCAINE 3.5 % OP GEL OPTIME - NO CHARGE
OPHTHALMIC | Status: DC | PRN
  Administered 2013-11-19: 2 [drp] via OPHTHALMIC

## 2013-11-19 MED ORDER — LIDOCAINE HCL 3.5 % OP GEL
1.0000 "application " | Freq: Once | OPHTHALMIC | Status: AC
  Administered 2013-11-19: 1 via OPHTHALMIC

## 2013-11-19 MED ORDER — POVIDONE-IODINE 5 % OP SOLN
OPHTHALMIC | Status: DC | PRN
  Administered 2013-11-19: 1 via OPHTHALMIC

## 2013-11-19 SURGICAL SUPPLY — 34 items
CAPSULAR TENSION RING-AMO (OPHTHALMIC RELATED) IMPLANT
CLOTH BEACON ORANGE TIMEOUT ST (SAFETY) ×3 IMPLANT
EYE SHIELD UNIVERSAL CLEAR (GAUZE/BANDAGES/DRESSINGS) ×3 IMPLANT
GLOVE BIO SURGEON STRL SZ 6.5 (GLOVE) ×2 IMPLANT
GLOVE BIO SURGEONS STRL SZ 6.5 (GLOVE) ×1
GLOVE BIOGEL PI IND STRL 6.5 (GLOVE) IMPLANT
GLOVE BIOGEL PI IND STRL 7.0 (GLOVE) ×1 IMPLANT
GLOVE BIOGEL PI IND STRL 7.5 (GLOVE) ×1 IMPLANT
GLOVE BIOGEL PI INDICATOR 6.5 (GLOVE)
GLOVE BIOGEL PI INDICATOR 7.0 (GLOVE) ×2
GLOVE BIOGEL PI INDICATOR 7.5 (GLOVE) ×2
GLOVE ECLIPSE 6.5 STRL STRAW (GLOVE) IMPLANT
GLOVE ECLIPSE 7.0 STRL STRAW (GLOVE) IMPLANT
GLOVE ECLIPSE 7.5 STRL STRAW (GLOVE) IMPLANT
GLOVE EXAM NITRILE LRG STRL (GLOVE) IMPLANT
GLOVE EXAM NITRILE MD LF STRL (GLOVE) IMPLANT
GLOVE SKINSENSE NS SZ6.5 (GLOVE)
GLOVE SKINSENSE NS SZ7.0 (GLOVE)
GLOVE SKINSENSE STRL SZ6.5 (GLOVE) IMPLANT
GLOVE SKINSENSE STRL SZ7.0 (GLOVE) IMPLANT
GLOVE SS N UNI LF 7.5 STRL (GLOVE) ×3 IMPLANT
KIT VITRECTOMY (OPHTHALMIC RELATED) IMPLANT
LENS SIGHTPATH STAAR TORIC ×3 IMPLANT
PAD ARMBOARD 7.5X6 YLW CONV (MISCELLANEOUS) ×3 IMPLANT
PROC W NO LENS (INTRAOCULAR LENS)
PROC W SPEC LENS (INTRAOCULAR LENS) ×3
PROCESS W NO LENS (INTRAOCULAR LENS) IMPLANT
PROCESS W SPEC LENS (INTRAOCULAR LENS) ×1 IMPLANT
RING MALYGIN (MISCELLANEOUS) IMPLANT
SYR TB 1ML LL NO SAFETY (SYRINGE) ×3 IMPLANT
TAPE SURG TRANSPORE 1 IN (GAUZE/BANDAGES/DRESSINGS) ×1 IMPLANT
TAPE SURGICAL TRANSPORE 1 IN (GAUZE/BANDAGES/DRESSINGS) ×2
VISCOELASTIC ADDITIONAL (OPHTHALMIC RELATED) IMPLANT
WATER STERILE IRR 250ML POUR (IV SOLUTION) ×3 IMPLANT

## 2013-11-19 NOTE — H&P (Signed)
I have reviewed the H&P, the patient was re-examined, and I have identified no interval changes in medical condition and plan of care since the history and physical of record  

## 2013-11-19 NOTE — Discharge Instructions (Signed)

## 2013-11-19 NOTE — Anesthesia Postprocedure Evaluation (Signed)
  Anesthesia Post-op Note  Patient: Catherine [name]  Procedure(s) Performed: Procedure(s) with comments: CATARACT EXTRACTION PHACO AND INTRAOCULAR LENS PLACEMENT (IOC) (Right) - CDE:  12.95  Patient Location: Short Stay  Anesthesia Type:MAC  Level of Consciousness: awake, alert  and oriented  Airway and Oxygen Therapy: Patient Spontanous Breathing  Post-op Pain: none  Post-op Assessment: Post-op Vital signs reviewed, Patient's Cardiovascular Status Stable, Respiratory Function Stable, Patent Airway and No signs of Nausea or vomiting  Post-op Vital Signs: Reviewed and stable  Last Vitals:  Filed Vitals:   11/19/13 0922  BP: 113/53  Pulse:   Temp:   Resp: 18    Complications: No apparent anesthesia complications

## 2013-11-19 NOTE — Anesthesia Preprocedure Evaluation (Signed)
Anesthesia Evaluation  Patient identified by MRN, date of birth, ID band Patient awake    Reviewed: Allergy & Precautions, H&P , NPO status , Patient's Chart, lab work & pertinent test results  Airway Mallampati: II TM Distance: >3 FB     Dental  (+) Edentulous Upper, Partial Lower   Pulmonary  breath sounds clear to auscultation        Cardiovascular hypertension, Pt. on medications Rhythm:Regular Rate:Normal     Neuro/Psych  Headaches,  Neuromuscular disease (tremors)    GI/Hepatic negative GI ROS,   Endo/Other  diabetes, Well Controlled, Type 2, Oral Hypoglycemic Agents  Renal/GU      Musculoskeletal   Abdominal   Peds  Hematology   Anesthesia Other Findings   Reproductive/Obstetrics                           Anesthesia Physical Anesthesia Plan  ASA: III  Anesthesia Plan: MAC   Post-op Pain Management:    Induction: Intravenous  Airway Management Planned: Nasal Cannula  Additional Equipment:   Intra-op Plan:   Post-operative Plan:   Informed Consent: I have reviewed the patients History and Physical, chart, labs and discussed the procedure including the risks, benefits and alternatives for the proposed anesthesia with the patient or authorized representative who has indicated his/her understanding and acceptance.     Plan Discussed with:   Anesthesia Plan Comments:         Anesthesia Quick Evaluation

## 2013-11-19 NOTE — Op Note (Signed)
Date of Admission: 11/19/2013  Date of Surgery: 11/19/2013  Pre-Op Dx: Cataract Right Eye  Post-Op Dx: Nuclear Cataract Right Eye,  Dx Code 366.16, Astigmatism Right Eye, Dx Code 892.11  Surgeon: Tonny Branch, M.D.  Assistants: None  Anesthesia: Topical with MAC  Indications: Painless, progressive loss of vision with compromise of daily activities.  Surgery: Cataract Extraction with Intraocular lens Implant Right Eye  Discription: The patient had dilating drops and viscous lidocaine placed into the Right in the pre-op holding area. In the sitting position horizontal reference marks were made on the cornea.  After transfer to the operating room, a time out was performed. The patient was then prepped and draped. Beginning with a 56 degree blade a paracentesis port was made at the surgeon's 2 o'clock position. The anterior chamber was then filled with 1% non-preserved lidocaine. This was followed by filling the anterior chamber with Provisc. A 2.12mm keratome blade was used to make a clear cornea incision at the temporal limbus. A bent cystatome needle was used to create a continuous tear capsulotomy. Hydrodissection was performed with balanced salt solution on a Fine canula. The lens nucleus was then removed using the phacoemulsification handpiece. Residual cortex was removed with the I&A handpiece. The anterior chamber and capsular bag were refilled with Provisc. A posterior chamber intraocular lens was placed into the capsular bag with it's injector. Additional corneal marks were made on the 025/205 degree meridians.  The Provisc was then removed from the anterior chamber and capsular bag with the I&A handpiece. The implant was positioned with the Kuglan hook on the 025/205 degree meridians. Stromal hydration of the main incision and paracentesis port was performed with BSS on a Fine canula. The wounds were tested for leak which was negative. The patient tolerated the procedure well. There were no  operative complications. The patient was then transferred to the recovery room in stable condition.  Complications: None  Specimen: None  EBL: None  Prosthetic device: STAAR Toric, [id number], power 19.0SE/+3.0, SN [id number].

## 2013-11-19 NOTE — Transfer of Care (Signed)
Immediate Anesthesia Transfer of Care Note  Patient: Catherine [name]  Procedure(s) Performed: Procedure(s) with comments: CATARACT EXTRACTION PHACO AND INTRAOCULAR LENS PLACEMENT (IOC) (Right) - CDE:  12.95  Patient Location: Short Stay  Anesthesia Type:MAC  Level of Consciousness: awake  Airway & Oxygen Therapy: Patient Spontanous Breathing  Post-op Assessment: Report given to PACU RN  Post vital signs: Reviewed and stable  Complications: No apparent anesthesia complications

## 2013-11-20 ENCOUNTER — Encounter (HOSPITAL_COMMUNITY): Payer: Self-pay | Admitting: Ophthalmology

## 2013-12-11 ENCOUNTER — Encounter (HOSPITAL_COMMUNITY): Payer: Self-pay | Admitting: Pharmacy Technician

## 2013-12-13 ENCOUNTER — Encounter (HOSPITAL_COMMUNITY)
Admission: RE | Admit: 2013-12-13 | Discharge: 2013-12-13 | Disposition: A | Discharge status: Home / Self Care | Payer: Medicare Other | Source: Ambulatory Visit | Attending: Ophthalmology | Admitting: Ophthalmology

## 2013-12-13 NOTE — Pre-Procedure Instructions (Signed)
Pt had complaint of "right eye weakness" after her cataract surgery 11/19/13. Pt encouraged to call office to let them be aware.

## 2013-12-14 MED ORDER — TETRACAINE HCL 0.5 % OP SOLN
OPHTHALMIC | Status: AC
  Filled 2013-12-14: qty 2

## 2013-12-14 MED ORDER — NEOMYCIN-POLYMYXIN-DEXAMETH 3.5-10000-0.1 OP SUSP
OPHTHALMIC | Status: AC
  Filled 2013-12-14: qty 5

## 2013-12-14 MED ORDER — ONDANSETRON HCL 4 MG/2ML IJ SOLN: 4.0000 mg | Freq: Once | INTRAMUSCULAR | Status: AC | PRN

## 2013-12-14 MED ORDER — FENTANYL CITRATE 0.05 MG/ML IJ SOLN: 25.0000 ug | INTRAMUSCULAR | Status: DC | PRN

## 2013-12-14 MED ORDER — LIDOCAINE HCL 3.5 % OP GEL
OPHTHALMIC | Status: AC
  Filled 2013-12-14: qty 1

## 2013-12-14 MED ORDER — CYCLOPENTOLATE-PHENYLEPHRINE OP SOLN OPTIME - NO CHARGE
OPHTHALMIC | Status: AC
  Filled 2013-12-14: qty 2

## 2013-12-14 MED ORDER — LIDOCAINE HCL (PF) 1 % IJ SOLN
INTRAMUSCULAR | Status: AC
  Filled 2013-12-14: qty 2

## 2013-12-14 MED ORDER — PHENYLEPHRINE HCL 2.5 % OP SOLN
OPHTHALMIC | Status: AC
  Filled 2013-12-14: qty 15

## 2013-12-17 ENCOUNTER — Encounter (HOSPITAL_COMMUNITY): Payer: Self-pay | Admitting: *Deleted

## 2013-12-17 ENCOUNTER — Encounter (HOSPITAL_COMMUNITY): Payer: Medicare Other | Admitting: Anesthesiology

## 2013-12-17 ENCOUNTER — Ambulatory Visit (HOSPITAL_COMMUNITY): Payer: Medicare Other | Admitting: Anesthesiology

## 2013-12-17 ENCOUNTER — Encounter (HOSPITAL_COMMUNITY)
Admission: RE | Disposition: A | Discharge status: Home / Self Care | Payer: Self-pay | Source: Ambulatory Visit | Attending: Ophthalmology

## 2013-12-17 ENCOUNTER — Ambulatory Visit (HOSPITAL_COMMUNITY)
Admission: RE | Admit: 2013-12-17 | Discharge: 2013-12-17 | Disposition: A | Discharge status: Home / Self Care | Payer: Medicare Other | Source: Ambulatory Visit | Attending: Ophthalmology | Admitting: Ophthalmology

## 2013-12-17 DIAGNOSIS — H52229 Regular astigmatism, unspecified eye: Secondary | ICD-10-CM | POA: Insufficient documentation

## 2013-12-17 DIAGNOSIS — H2510 Age-related nuclear cataract, unspecified eye: Secondary | ICD-10-CM | POA: Insufficient documentation

## 2013-12-17 HISTORY — PX: CATARACT EXTRACTION W/PHACO: SHX586

## 2013-12-17 LAB — GLUCOSE, CAPILLARY: GLUCOSE-CAPILLARY: 99 mg/dL (ref 70–99)

## 2013-12-17 SURGERY — PHACOEMULSIFICATION, CATARACT, WITH IOL INSERTION
Anesthesia: Monitor Anesthesia Care | Site: Eye | Laterality: Left

## 2013-12-17 MED ORDER — LACTATED RINGERS IV SOLN
INTRAVENOUS | Status: DC
  Administered 2013-12-17: 08:00:00 via INTRAVENOUS

## 2013-12-17 MED ORDER — PHENYLEPHRINE HCL 2.5 % OP SOLN
1.0000 [drp] | OPHTHALMIC | Status: AC
  Administered 2013-12-17 (×3): 1 [drp] via OPHTHALMIC

## 2013-12-17 MED ORDER — CYCLOPENTOLATE-PHENYLEPHRINE 0.2-1 % OP SOLN
1.0000 [drp] | OPHTHALMIC | Status: AC
  Administered 2013-12-17 (×3): 1 [drp] via OPHTHALMIC

## 2013-12-17 MED ORDER — MIDAZOLAM HCL 2 MG/2ML IJ SOLN
INTRAMUSCULAR | Status: AC
  Filled 2013-12-17: qty 2

## 2013-12-17 MED ORDER — BSS IO SOLN
INTRAOCULAR | Status: DC | PRN
  Administered 2013-12-17: 15 mL

## 2013-12-17 MED ORDER — LIDOCAINE HCL 3.5 % OP GEL
1.0000 "application " | Freq: Once | OPHTHALMIC | Status: AC
  Administered 2013-12-17: 1 via OPHTHALMIC

## 2013-12-17 MED ORDER — PROVISC 10 MG/ML IO SOLN
INTRAOCULAR | Status: DC | PRN
  Administered 2013-12-17: 0.85 mL via INTRAOCULAR

## 2013-12-17 MED ORDER — NEOMYCIN-POLYMYXIN-DEXAMETH 3.5-10000-0.1 OP SUSP
OPHTHALMIC | Status: DC | PRN
  Administered 2013-12-17: 2 [drp] via OPHTHALMIC

## 2013-12-17 MED ORDER — EPINEPHRINE HCL 1 MG/ML IJ SOLN
INTRAOCULAR | Status: DC | PRN
  Administered 2013-12-17: 09:00:00

## 2013-12-17 MED ORDER — MIDAZOLAM HCL 2 MG/2ML IJ SOLN
1.0000 mg | INTRAMUSCULAR | Status: DC | PRN
  Administered 2013-12-17 (×2): 2 mg via INTRAVENOUS
  Filled 2013-12-17: qty 2

## 2013-12-17 MED ORDER — EPINEPHRINE HCL 1 MG/ML IJ SOLN
INTRAMUSCULAR | Status: AC
  Filled 2013-12-17: qty 1

## 2013-12-17 MED ORDER — FENTANYL CITRATE 0.05 MG/ML IJ SOLN
INTRAMUSCULAR | Status: AC
  Filled 2013-12-17: qty 2

## 2013-12-17 MED ORDER — TETRACAINE HCL 0.5 % OP SOLN
1.0000 [drp] | OPHTHALMIC | Status: AC
  Administered 2013-12-17 (×3): 1 [drp] via OPHTHALMIC

## 2013-12-17 MED ORDER — POVIDONE-IODINE 5 % OP SOLN
OPHTHALMIC | Status: DC | PRN
  Administered 2013-12-17: 1 via OPHTHALMIC

## 2013-12-17 MED ORDER — LIDOCAINE HCL (PF) 1 % IJ SOLN
INTRAMUSCULAR | Status: DC | PRN
  Administered 2013-12-17: .4 mL

## 2013-12-17 SURGICAL SUPPLY — 34 items
CAPSULAR TENSION RING-AMO (OPHTHALMIC RELATED) IMPLANT
CLOTH BEACON ORANGE TIMEOUT ST (SAFETY) ×3 IMPLANT
EYE SHIELD UNIVERSAL CLEAR (GAUZE/BANDAGES/DRESSINGS) ×3 IMPLANT
GLOVE BIO SURGEON STRL SZ 6.5 (GLOVE) IMPLANT
GLOVE BIO SURGEONS STRL SZ 6.5 (GLOVE)
GLOVE BIOGEL PI IND STRL 6.5 (GLOVE) IMPLANT
GLOVE BIOGEL PI IND STRL 7.0 (GLOVE) ×1 IMPLANT
GLOVE BIOGEL PI IND STRL 7.5 (GLOVE) IMPLANT
GLOVE BIOGEL PI INDICATOR 6.5 (GLOVE)
GLOVE BIOGEL PI INDICATOR 7.0 (GLOVE) ×2
GLOVE BIOGEL PI INDICATOR 7.5 (GLOVE)
GLOVE ECLIPSE 6.5 STRL STRAW (GLOVE) IMPLANT
GLOVE ECLIPSE 7.0 STRL STRAW (GLOVE) IMPLANT
GLOVE ECLIPSE 7.5 STRL STRAW (GLOVE) IMPLANT
GLOVE EXAM NITRILE LRG STRL (GLOVE) IMPLANT
GLOVE EXAM NITRILE MD LF STRL (GLOVE) ×3 IMPLANT
GLOVE SKINSENSE NS SZ6.5 (GLOVE)
GLOVE SKINSENSE NS SZ7.0 (GLOVE)
GLOVE SKINSENSE STRL SZ6.5 (GLOVE) IMPLANT
GLOVE SKINSENSE STRL SZ7.0 (GLOVE) IMPLANT
KIT VITRECTOMY (OPHTHALMIC RELATED) IMPLANT
LENS SIGHTPATH STAAR TORIC ×3 IMPLANT
PAD ARMBOARD 7.5X6 YLW CONV (MISCELLANEOUS) ×3 IMPLANT
PROC W NO LENS (INTRAOCULAR LENS)
PROC W SPEC LENS (INTRAOCULAR LENS) ×3
PROCESS W NO LENS (INTRAOCULAR LENS) IMPLANT
PROCESS W SPEC LENS (INTRAOCULAR LENS) ×1 IMPLANT
RETRACTOR IRIS SIGHTPATH (OPHTHALMIC RELATED) IMPLANT
RING MALYGIN (MISCELLANEOUS) IMPLANT
SYRINGE LUER LOK 1CC (MISCELLANEOUS) ×3 IMPLANT
TAPE SURG TRANSPORE 1 IN (GAUZE/BANDAGES/DRESSINGS) ×1 IMPLANT
TAPE SURGICAL TRANSPORE 1 IN (GAUZE/BANDAGES/DRESSINGS) ×2
VISCOELASTIC ADDITIONAL (OPHTHALMIC RELATED) IMPLANT
WATER STERILE IRR 250ML POUR (IV SOLUTION) ×3 IMPLANT

## 2013-12-17 NOTE — Discharge Instructions (Signed)

## 2013-12-17 NOTE — H&P (Signed)
I have reviewed the H&P, the patient was re-examined, and I have identified no interval changes in medical condition and plan of care since the history and physical of record  

## 2013-12-17 NOTE — Op Note (Signed)
Date of Admission: 12/17/2013  Date of Surgery: 12/17/2013  Pre-Op Dx: Cataract Left Eye  Post-Op Dx: Senile Nuclear Cataract Left Eye,  Dx Code 366.16, Astigmatism Left Eye, Dx Code 811.91  Surgeon: Tonny Branch, M.D.  Assistants: None  Anesthesia: Topical with MAC  Indications: Painless, progressive loss of vision with compromise of daily activities.  Surgery: Cataract Extraction with Intraocular lens Implant Left Eye  Discription: The patient had dilating drops and viscous lidocaine placed into the Left in the pre-op holding area. In the sitting position horizontal reference marks were made on the cornea.  After transfer to the operating room, a time out was performed. The patient was then prepped and draped. Beginning with a 57 degree blade a paracentesis port was made at the surgeon's 2 o'clock position. The anterior chamber was then filled with 1% non-preserved lidocaine. This was followed by filling the anterior chamber with Provisc. A 2.55mm keratome blade was used to make a clear cornea incision at the temporal limbus. A bent cystatome needle was used to create a continuous tear capsulotomy. Hydrodissection was performed with balanced salt solution on a Fine canula. The lens nucleus was then removed using the phacoemulsification handpiece. Residual cortex was removed with the I&A handpiece. The anterior chamber and capsular bag were refilled with Provisc. A posterior chamber intraocular lens was placed into the capsular bag with it's injector. Additional corneal marks were made on the 157/337 degree meridians.  The Provisc was then removed from the anterior chamber and capsular bag with the I&A handpiece. The implant was positioned with the Kuglan hook. Stromal hydration of the main incision and paracentesis port was performed with BSS on a Fine canula. The wounds were tested for leak which was negative. The patient tolerated the procedure well. There were no operative complications. The  patient was then transferred to the recovery room in stable condition.  Complications: None  Specimen: None  EBL: None  Prosthetic device: STAAR Toric, [id number], power 18.5SE/+2.0, SN [id number].

## 2013-12-17 NOTE — Transfer of Care (Signed)
Immediate Anesthesia Transfer of Care Note  Patient: Catherine [name]  Procedure(s) Performed: Procedure(s) with comments: CATARACT EXTRACTION PHACO AND INTRAOCULAR LENS PLACEMENT (IOC) (Left) - CDE 12.10  Patient Location: Short Stay  Anesthesia Type:MAC  Level of Consciousness: awake  Airway & Oxygen Therapy: Patient Spontanous Breathing  Post-op Assessment: Report given to PACU RN  Post vital signs: Reviewed and stable  Complications: No apparent anesthesia complications

## 2013-12-17 NOTE — Anesthesia Postprocedure Evaluation (Signed)
  Anesthesia Post-op Note  Patient: Catherine [name]  Procedure(s) Performed: Procedure(s) with comments: CATARACT EXTRACTION PHACO AND INTRAOCULAR LENS PLACEMENT (IOC) (Left) - CDE 12.10  Patient Location: Short Stay  Anesthesia Type:MAC  Level of Consciousness: awake, alert  and oriented  Airway and Oxygen Therapy: Patient Spontanous Breathing  Post-op Pain: none  Post-op Assessment: Post-op Vital signs reviewed, Patient's Cardiovascular Status Stable, Respiratory Function Stable, Patent Airway and No signs of Nausea or vomiting  Post-op Vital Signs: Reviewed and stable  Last Vitals:  Filed Vitals:   12/17/13 0843  BP: 108/54  Pulse:   Temp:   Resp: 14    Complications: No apparent anesthesia complications

## 2013-12-17 NOTE — Anesthesia Preprocedure Evaluation (Signed)
Anesthesia Evaluation  Patient identified by MRN, date of birth, ID band Patient awake    Reviewed: Allergy & Precautions, H&P , NPO status , Patient's Chart, lab work & pertinent test results  Airway Mallampati: II TM Distance: >3 FB     Dental  (+) Edentulous Upper, Partial Lower   Pulmonary  breath sounds clear to auscultation        Cardiovascular hypertension, Pt. on medications Rhythm:Regular Rate:Normal     Neuro/Psych  Headaches,  Neuromuscular disease (tremors)    GI/Hepatic negative GI ROS,   Endo/Other  diabetes, Well Controlled, Type 2, Oral Hypoglycemic Agents  Renal/GU      Musculoskeletal   Abdominal   Peds  Hematology   Anesthesia Other Findings   Reproductive/Obstetrics                           Anesthesia Physical Anesthesia Plan  ASA: III  Anesthesia Plan: MAC   Post-op Pain Management:    Induction: Intravenous  Airway Management Planned: Nasal Cannula  Additional Equipment:   Intra-op Plan:   Post-operative Plan:   Informed Consent: I have reviewed the patients History and Physical, chart, labs and discussed the procedure including the risks, benefits and alternatives for the proposed anesthesia with the patient or authorized representative who has indicated his/her understanding and acceptance.     Plan Discussed with:   Anesthesia Plan Comments:         Anesthesia Quick Evaluation

## 2013-12-27 ENCOUNTER — Other Ambulatory Visit (HOSPITAL_COMMUNITY): Payer: Medicare Other

## 2014-01-24 ENCOUNTER — Ambulatory Visit: Payer: Medicare Other | Admitting: Neurology

## 2014-03-27 ENCOUNTER — Encounter: Payer: Self-pay | Admitting: Neurology

## 2014-04-02 ENCOUNTER — Encounter: Payer: Self-pay | Admitting: Neurology

## 2014-05-27 ENCOUNTER — Ambulatory Visit (INDEPENDENT_AMBULATORY_CARE_PROVIDER_SITE_OTHER): Payer: Medicare Other | Admitting: Podiatry

## 2014-05-27 ENCOUNTER — Ambulatory Visit (INDEPENDENT_AMBULATORY_CARE_PROVIDER_SITE_OTHER): Payer: Medicare Other

## 2014-05-27 ENCOUNTER — Encounter: Payer: Self-pay | Admitting: Podiatry

## 2014-05-27 DIAGNOSIS — M7662 Achilles tendinitis, left leg: Secondary | ICD-10-CM

## 2014-05-27 DIAGNOSIS — M79673 Pain in unspecified foot: Secondary | ICD-10-CM

## 2014-05-27 DIAGNOSIS — S86012A Strain of left Achilles tendon, initial encounter: Secondary | ICD-10-CM

## 2014-05-27 DIAGNOSIS — B351 Tinea unguium: Secondary | ICD-10-CM

## 2014-05-27 NOTE — Patient Instructions (Addendum)
Diabetes and Foot Care Diabetes may cause you to have problems because of poor blood supply (circulation) to your feet and legs. This may cause the skin on your feet to become thinner, break easier, and heal more slowly. Your skin may become dry, and the skin may peel and crack. You may also have nerve damage in your legs and feet causing decreased feeling in them. You may not notice minor injuries to your feet that could lead to infections or more serious problems. Taking care of your feet is one of the most important things you can do for yourself.  HOME CARE INSTRUCTIONS  Wear shoes at all times, even in the house. Do not go barefoot. Bare feet are easily injured.  Check your feet daily for blisters, cuts, and redness. If you cannot see the bottom of your feet, use a mirror or ask someone for help.  Wash your feet with warm water (do not use hot water) and mild soap. Then pat your feet and the areas between your toes until they are completely dry. Do not soak your feet as this can dry your skin.  Apply a moisturizing lotion or petroleum jelly (that does not contain alcohol and is unscented) to the skin on your feet and to dry, brittle toenails. Do not apply lotion between your toes.  Trim your toenails straight across. Do not dig under them or around the cuticle. File the edges of your nails with an emery board or nail file.  Do not cut corns or calluses or try to remove them with medicine.  Wear clean socks or stockings every day. Make sure they are not too tight. Do not wear knee-high stockings since they may decrease blood flow to your legs.  Wear shoes that fit properly and have enough cushioning. To break in new shoes, wear them for just a few hours a day. This prevents you from injuring your feet. Always look in your shoes before you put them on to be sure there are no objects inside.  Do not cross your legs. This may decrease the blood flow to your feet.  If you find a minor scrape,  cut, or break in the skin on your feet, keep it and the skin around it clean and dry. These areas may be cleansed with mild soap and water. Do not cleanse the area with peroxide, alcohol, or iodine.  When you remove an adhesive bandage, be sure not to damage the skin around it.  If you have a wound, look at it several times a day to make sure it is healing.  Do not use heating pads or hot water bottles. They may burn your skin. If you have lost feeling in your feet or legs, you may not know it is happening until it is too late.  Make sure your health care provider performs a complete foot exam at least annually or more often if you have foot problems. Report any cuts, sores, or bruises to your health care provider immediately. SEEK MEDICAL CARE IF:   You have an injury that is not healing.  You have cuts or breaks in the skin.  You have an ingrown nail.  You notice redness on your legs or feet.  You feel burning or tingling in your legs or feet.  You have pain or cramps in your legs and feet.  Your legs or feet are numb.  Your feet always feel cold. SEEK IMMEDIATE MEDICAL CARE IF:   There is increasing redness,   swelling, or pain in or around a wound.  There is a red line that goes up your leg.  Pus is coming from a wound.  You develop a fever or as directed by your health care provider.  You notice a bad smell coming from an ulcer or wound. Document Released: 04/23/2000 Document Revised: 12/27/2012 Document Reviewed: 10/03/2012 [employer] Patient Information 2015 [employer]. This information is not intended to replace advice given to you by your health care provider. Make sure you discuss any questions you have with your health care provider.  

## 2014-05-28 ENCOUNTER — Telehealth: Payer: Self-pay | Admitting: *Deleted

## 2014-05-28 NOTE — Telephone Encounter (Addendum)
Pt states the ankle brace is worsening the discomfort in her foot and ankle.  I spoke with pt's neighbor, Uvaldo Rising and her husband, who states the pt slept in the ankle brace.  I instructed the Combers to have the pt remove the ankle brace at night and reapply in the morning.  I also informed them of Dr.Wagoner's orders, and they will inform the pt due to her hearing impairment and continue with the ankle brace at this point.  The Combers take the pt to appts and help her daily.

## 2014-05-28 NOTE — Progress Notes (Signed)
Patient ID: Catherine [name], female   DOB: 01-05-1928, 81 y.o.   MRN: [id number]  Subjective: 81 year old female presents the office today for painful, elongated toenails. She states that her nails are painful particularly shoe gear and she is unable to trim this nails herself. She denies any recent redness or drainage from the nail sites. She also states that approximately 3 weeks ago she had a fall injuring her left leg. She states that she has pain in the back of her leg behind the ankle. She states that she fell in a hole. She states that she previously has seen another physician for this and x-rays were obtained and she was not told that she had any fracture. She has pain with ambulation. No other complaints at this time.  Objective: AAO 3, NAD DP/PT pulses palpable, CRT less than 3 seconds Protective sensation appears to be intact with Derrel Nip monofilament, vibratory sensation intact, Achilles tendon reflex intact. Nails are hypertrophic, dystrophic, brittle, elongated, discolored 10. No surrounding erythema or drainage from around the nail sites. There is tenderness to palpation overlying the posterior aspect left ankle on the insertion of the Achilles tendon. There is found to be a small defect within the Achilles tendon distally however does not appear that the tendon is completely torn. Thompson test was performed and appears to be negative. There is active and passive range of motion in plantarflexion and dorsiflexion however it is weakened compared to the contralateral extremity. There is overlying edema to the posterior aspect of the distal Achilles tendon. There is no pinpoint bony tenderness or pain the vibratory sensation to the ankle or foot. No tenderness to the right lower extremity. No pain with calf compression, swelling, warmth, erythema. No open lesions or pre-ulcerative lesions.  Assessment: 81 year old female presents for symptomatic onychomycosis however also found to  have partial tear of the left Achilles tendon.  Plan: -X-rays were obtained and reviewed the patient. -Nail sharply debrided 10 without complication/bleeding. -Discussed with the patient likely there is a partial tear of the Achilles tendon. The patient lives alone is independent and I do not feel be safe for her to have a Cam Walker or cast and I discussed this with her. I did dispense an ankle brace to help stabilize the ankle to help take some of the tension off the Achilles tendon. I discussed with her that this is not the best treatment for this however given her situation I believe that is the most safe. Ice to the area. -Follow-up in 2 weeks or sooner should any problems arise. In the meantime, encouraged to call the office with any questions, concerns, change in symptoms.

## 2014-05-28 NOTE — Telephone Encounter (Signed)
She can come in and we can try a CAM boot with a heel lift. If she cannot tolerate that, we can do a heel lift in the shoe. She would be best in a boot or some type of immobilization, however I am not sure given her mobility if that is the safest option. Thanks.

## 2014-06-12 ENCOUNTER — Ambulatory Visit (INDEPENDENT_AMBULATORY_CARE_PROVIDER_SITE_OTHER): Payer: Medicare Other | Admitting: Podiatry

## 2014-06-12 ENCOUNTER — Encounter: Payer: Self-pay | Admitting: Podiatry

## 2014-06-12 VITALS — BP 152/73 | HR 77 | Resp 18

## 2014-06-12 DIAGNOSIS — S86012A Strain of left Achilles tendon, initial encounter: Secondary | ICD-10-CM

## 2014-06-12 NOTE — Progress Notes (Signed)
Patient ID: Catherine [name], female   DOB: 24-Aug-1927, 81 y.o.   MRN: [id number]  Subjective: 81 year old female returns the office today for follow-up evaluation of left Achilles likely partial tear. She states that since last appointment the area has improved in symptoms although she does continue to have some discomfort particularly with pressure, for which she points to the distal Achilles tendon. She states that she's been icing the area which helps improve her symptoms. She's been continuing with the ankle brace. No other complaints at this time in no acute changes since last appointment.  Objective: AAO 3, NAD  DP/PT pulses palpable, CRT less than 3 seconds Protective sensation  intact with Simms Weinstein monofilament, vibratory sensation intact. There is tenderness to palpation overlying the distal aspect of the left Achilles tendon just proximal to the insertion onto the calcaneus. There does appear to be a partial tear palpable within the Achilles tendon however the Achilles tendon is not completely torn. Thompson test was performed and appears to be negative. The patient is able to plantarflex and dorsiflex however there is weakness in plantarflexion the left compared to the right. There is mild edema along the posterior aspect the distal aspect of the Achilles tendon however does appear to be improved since last appointment. No overlying erythema or increase in warmth. There is no pain with calf compression, swelling, warmth, erythema. No open lesions or pre-ulcerative lesions are identified.  Assessment: 81 year old female with left likely partial Achilles tendon tear.  Plan: -Treatment options were discussed the patient including alternatives, risks, complications. -At this time continue with ice and elevation. -Continue with ankle brace. Again discussed with the patient this is not the optimal treatment for this particular injury however given her mobility status she would not likely  be safe wearing the boot and she not be safe nonweightbearing.  -Follow-up in 4 weeks or sooner should any problems arise. In the meantime, encouraged to call the office with any questions, concerns, change in symptoms.

## 2014-07-10 ENCOUNTER — Encounter: Payer: Self-pay | Admitting: Podiatry

## 2014-07-10 ENCOUNTER — Telehealth: Payer: Self-pay | Admitting: *Deleted

## 2014-07-10 ENCOUNTER — Ambulatory Visit (INDEPENDENT_AMBULATORY_CARE_PROVIDER_SITE_OTHER): Payer: Medicare Other | Admitting: Podiatry

## 2014-07-10 VITALS — BP 137/62 | HR 75 | Resp 18

## 2014-07-10 DIAGNOSIS — S86012D Strain of left Achilles tendon, subsequent encounter: Secondary | ICD-10-CM

## 2014-07-10 NOTE — Telephone Encounter (Signed)
I called and informed Catherine [name]) [facility] that we need [facility] to take care of this patient for Physical Therapy.  "Okay, I'll look for it in EPIC and take care of setting it up."

## 2014-07-10 NOTE — Patient Instructions (Signed)
Achilles Tendinitis   with Rehab  Achilles tendinitis is a disorder of the Achilles tendon. The Achilles tendon connects the large calf muscles (Gastrocnemius and Soleus) to the heel bone (calcaneus). This tendon is sometimes called the heel cord. It is important for pushing-off and standing on your toes and is important for walking, running, or jumping. Tendinitis is often caused by overuse and repetitive microtrauma.  SYMPTOMS  · Pain, tenderness, swelling, warmth, and redness may occur over the Achilles tendon even at rest.  · Pain with pushing off, or flexing or extending the ankle.  · Pain that is worsened after or during activity.  CAUSES   · Overuse sometimes seen with rapid increase in exercise programs or in sports requiring running and jumping.  · Poor physical conditioning (strength and flexibility or endurance).  · Running sports, especially training running down hills.  · Inadequate warm-up before practice or play or failure to stretch before participation.  · Injury to the tendon.  PREVENTION   · Warm up and stretch before practice or competition.  · Allow time for adequate rest and recovery between practices and competition.  · Keep up conditioning.  ¨ Keep up ankle and leg flexibility.  ¨ Improve or keep muscle strength and endurance.  ¨ Improve cardiovascular fitness.  · Use proper technique.  · Use proper equipment (shoes, skates).  · To help prevent recurrence, taping, protective strapping, or an adhesive bandage may be recommended for several weeks after healing is complete.  PROGNOSIS   · Recovery may take weeks to several months to heal.  · Longer recovery is expected if symptoms have been prolonged.  · Recovery is usually quicker if the inflammation is due to a direct blow as compared with overuse or sudden strain.  RELATED COMPLICATIONS   · Healing time will be prolonged if the condition is not correctly treated. The injury must be given plenty of time to heal.  · Symptoms can reoccur if  activity is resumed too soon.  · Untreated, tendinitis may increase the risk of tendon rupture requiring additional time for recovery and possibly surgery.  TREATMENT   · The first treatment consists of rest anti-inflammatory medication, and ice to relieve the pain.  · Stretching and strengthening exercises after resolution of pain will likely help reduce the risk of recurrence. Referral to a physical therapist or athletic trainer for further evaluation and treatment may be helpful.  · A walking boot or cast may be recommended to rest the Achilles tendon. This can help break the cycle of inflammation and microtrauma.  · Arch supports (orthotics) may be prescribed or recommended by your caregiver as an adjunct to therapy and rest.  · Surgery to remove the inflamed tendon lining or degenerated tendon tissue is rarely necessary and has shown less than predictable results.  MEDICATION   · Nonsteroidal anti-inflammatory medications, such as aspirin and ibuprofen, may be used for pain and inflammation relief. Do not take within 7 days before surgery. Take these as directed by your caregiver. Contact your caregiver immediately if any bleeding, stomach upset, or signs of allergic reaction occur. Other minor pain relievers, such as acetaminophen, may also be used.  · Pain relievers may be prescribed as necessary by your caregiver. Do not take prescription pain medication for longer than 4 to 7 days. Use only as directed and only as much as you need.  · Cortisone injections are rarely indicated. Cortisone injections may weaken tendons and predispose to rupture. It is better   to give the condition more time to heal than to use them.  HEAT AND COLD  · Cold is used to relieve pain and reduce inflammation for acute and chronic Achilles tendinitis. Cold should be applied for 10 to 15 minutes every 2 to 3 hours for inflammation and pain and immediately after any activity that aggravates your symptoms. Use ice packs or an ice  massage.  · Heat may be used before performing stretching and strengthening activities prescribed by your caregiver. Use a heat pack or a warm soak.  SEEK MEDICAL CARE IF:  · Symptoms get worse or do not improve in 2 weeks despite treatment.  · New, unexplained symptoms develop. Drugs used in treatment may produce side effects.  EXERCISES  RANGE OF MOTION (ROM) AND STRETCHING EXERCISES - Achilles Tendinitis   These exercises may help you when beginning to rehabilitate your injury. Your symptoms may resolve with or without further involvement from your physician, physical therapist or athletic trainer. While completing these exercises, remember:   · Restoring tissue flexibility helps normal motion to return to the joints. This allows healthier, less painful movement and activity.  · An effective stretch should be held for at least 30 seconds.  · A stretch should never be painful. You should only feel a gentle lengthening or release in the stretched tissue.  STRETCH - Gastroc, Standing   · Place hands on wall.  · Extend right / left leg, keeping the front knee somewhat bent.  · Slightly point your toes inward on your back foot.  · Keeping your right / left heel on the floor and your knee straight, shift your weight toward the wall, not allowing your back to arch.  · You should feel a gentle stretch in the right / left calf. Hold this position for __________ seconds.  Repeat __________ times. Complete this stretch __________ times per day.  STRETCH - Soleus, Standing   · Place hands on wall.  · Extend right / left leg, keeping the other knee somewhat bent.  · Slightly point your toes inward on your back foot.  · Keep your right / left heel on the floor, bend your back knee, and slightly shift your weight over the back leg so that you feel a gentle stretch deep in your back calf.  · Hold this position for __________ seconds.  Repeat __________ times. Complete this stretch __________ times per day.  STRETCH -  Gastrocsoleus, Standing   Note: This exercise can place a lot of stress on your foot and ankle. Please complete this exercise only if specifically instructed by your caregiver.   · Place the ball of your right / left foot on a step, keeping your other foot firmly on the same step.  · Hold on to the wall or a rail for balance.  · Slowly lift your other foot, allowing your body weight to press your heel down over the edge of the step.  · You should feel a stretch in your right / left calf.  · Hold this position for __________ seconds.  · Repeat this exercise with a slight bend in your knee.  Repeat __________ times. Complete this stretch __________ times per day.   STRENGTHENING EXERCISES - Achilles Tendinitis  These exercises may help you when beginning to rehabilitate your injury. They may resolve your symptoms with or without further involvement from your physician, physical therapist or athletic trainer. While completing these exercises, remember:   · Muscles can gain both the endurance   and the strength needed for everyday activities through controlled exercises.  · Complete these exercises as instructed by your physician, physical therapist or athletic trainer. Progress the resistance and repetitions only as guided.  · You may experience muscle soreness or fatigue, but the pain or discomfort you are trying to eliminate should never worsen during these exercises. If this pain does worsen, stop and make certain you are following the directions exactly. If the pain is still present after adjustments, discontinue the exercise until you can discuss the trouble with your clinician.  STRENGTH - Plantar-flexors   · Sit with your right / left leg extended. Holding onto both ends of a rubber exercise band/tubing, loop it around the ball of your foot. Keep a slight tension in the band.  · Slowly push your toes away from you, pointing them downward.  · Hold this position for __________ seconds. Return slowly, controlling the  tension in the band/tubing.  Repeat __________ times. Complete this exercise __________ times per day.   STRENGTH - Plantar-flexors   · Stand with your feet shoulder width apart. Steady yourself with a wall or table using as little support as needed.  · Keeping your weight evenly spread over the width of your feet, rise up on your toes.*  · Hold this position for __________ seconds.  Repeat __________ times. Complete this exercise __________ times per day.   *If this is too easy, shift your weight toward your right / left leg until you feel challenged. Ultimately, you may be asked to do this exercise with your right / left foot only.  STRENGTH - Plantar-flexors, Eccentric   Note: This exercise can place a lot of stress on your foot and ankle. Please complete this exercise only if specifically instructed by your caregiver.   · Place the balls of your feet on a step. With your hands, use only enough support from a wall or rail to keep your balance.  · Keep your knees straight and rise up on your toes.  · Slowly shift your weight entirely to your right / left toes and pick up your opposite foot. Gently and with controlled movement, lower your weight through your right / left foot so that your heel drops below the level of the step. You will feel a slight stretch in the back of your calf at the end position.  · Use the healthy leg to help rise up onto the balls of both feet, then lower weight only on the right / left leg again. Build up to 15 repetitions. Then progress to 3 consecutive sets of 15 repetitions.*  · After completing the above exercise, complete the same exercise with a slight knee bend (about 30 degrees). Again, build up to 15 repetitions. Then progress to 3 consecutive sets of 15 repetitions.*  Perform this exercise __________ times per day.   *When you easily complete 3 sets of 15, your physician, physical therapist or athletic trainer may advise you to add resistance by wearing a backpack filled with  additional weight.  STRENGTH - Plantar Flexors, Seated   · Sit on a chair that allows your feet to rest flat on the ground. If necessary, sit at the edge of the chair.  · Keeping your toes firmly on the ground, lift your right / left heel as far as you can without increasing any discomfort in your ankle.  Repeat __________ times. Complete this exercise __________ times a day.  *If instructed by your physician, physical therapist or athletic   trainer, you may add ____________________ of resistance by placing a weighted object on your right / left knee.  Document Released: 11/25/2004 Document Revised: 07/19/2011 Document Reviewed: 08/08/2008  [employer]® Patient Information ©2015 [employer]. This information is not intended to replace advice given to you by your health care provider. Make sure you discuss any questions you have with your health care provider.

## 2014-07-16 NOTE — Progress Notes (Signed)
Patient ID: Catherine [name], female   DOB: 05-Jun-1927, 81 y.o.   MRN: [id number]   Subjective: Catherine [name] returns the office today for follow-up evaluation of left Achilles likely partial tear. She states that she's been continuing to wear the ankle brace daily. She does that her pain has improved although she does have some discomfort upon palpation of the area. Denies any recent injury or trauma. No other complaints at this time. Denies any systemic complaints as fevers, chills, nausea, vomiting..  Objective: AAO 3, NAD  DP/PT pulses palpable, CRT less than 3 seconds Protective sensation  intact with Simms Weinstein monofilament, vibratory sensation intact. There is tenderness to palpation overlying the distal aspect of the left Achilles tendon just proximal to the insertion onto the calcaneus, although it does appear to be improved at this time. There is no complete tear of the Achilles tendon no defect is palpable this time. Thompson test was performed and was negative. There is mild overlying edema to the posterior aspect of the Achilles tendon but this does appear to be improved. There is no overlying erythema or increase in warmth. There is no pain with calf compression, swelling, warmth, erythema. No open lesions or pre-ulcer lesions identified bilaterally.  Assessment: 81 year old female with left likely partial Achilles tendon tear, resolving  Plan: -Treatment options were discussed the patient including alternatives, risks, complications. -At this time continue with ice and elevation. -Continue with ankle brace.  -Discussed the patient that she would likely benefit from this we'll therapy to help regain her strength and mobility. She is unable to go to physical therapy however I did order home physical therapy. -Follow-up in 4 weeks or sooner should any problems arise. In the meantime, encouraged to call the office with any questions, concerns, change in symptoms.

## 2014-08-07 ENCOUNTER — Ambulatory Visit: Payer: Medicare Other | Admitting: Podiatry

## 2014-08-28 ENCOUNTER — Ambulatory Visit: Payer: Medicare Other | Admitting: Podiatry

## 2014-08-30 ENCOUNTER — Ambulatory Visit (INDEPENDENT_AMBULATORY_CARE_PROVIDER_SITE_OTHER): Payer: Medicare Other | Admitting: Podiatry

## 2014-08-30 ENCOUNTER — Encounter: Payer: Self-pay | Admitting: Podiatry

## 2014-08-30 VITALS — BP 120/66 | HR 122 | Resp 18

## 2014-08-30 DIAGNOSIS — B351 Tinea unguium: Secondary | ICD-10-CM | POA: Diagnosis not present

## 2014-08-30 DIAGNOSIS — S86012D Strain of left Achilles tendon, subsequent encounter: Secondary | ICD-10-CM

## 2014-08-30 DIAGNOSIS — M79673 Pain in unspecified foot: Secondary | ICD-10-CM | POA: Diagnosis not present

## 2014-08-31 NOTE — Progress Notes (Signed)
Patient ID: Catherine [name], female   DOB: 04-07-1928, 81 y.o.   MRN: [id number]  Subjective: 81 y.o.-year-old female returns the office today for painful, elongated, thickened toenails for which she cannot trim herself. Denies any redness or drainage around the nails. She also presents today for evaluation of left partial Achilles tendon tear. She states that she had the therapist come to the house which worked with her for a couple of days. She does continue with the brace the majority the time. She states the pain is significantly improved at this time. Denies any acute changes since last appointment and no new complaints today. Denies any systemic complaints such as fevers, chills, nausea, vomiting.   Objective: AAO 3, NAD DP/PT pulses palpable, CRT less than 3 seconds Protective sensation intact with Simms Weinstein monofilament, Achilles tendon reflex intact.  Nails hypertrophic, dystrophic, elongated, brittle, discolored 10. There is tenderness overlying these nails. There is no surrounding erythema or drainage along the nail sites. No open lesions or pre-ulcerative lesions are identified. On the anterior aspect of the left Achilles tendon there does appear to be significantly improved symptoms. There isskin tenderness to palpation overlying the area. No defect is noted although there is slight hypertrophy of the tendon. No overlying edema, erythema, increase in warmth. Thompson test is negative. No other areas of tenderness bilateral lower extremities. No overlying edema, erythema, increased warmth. No pain with calf compression, swelling, warmth, erythema.  Assessment: Patient presents with symptomatic onychomycosis; left Achilles partial tear follow-up  Plan: -Treatment options including alternatives, risks, complications were discussed -Nails sharply debrided 10 without complication/bleeding. -She can continue the ankle brace as needed however she can start to transition without it.  If she has any instability in her ankle without the brace to wear the brace. -Discussed daily foot inspection. If there are any changes, to call the office immediately.  -Follow-up in 3 months or sooner if any problems are to arise. In the meantime, encouraged to call the office with any questions, concerns, changes symptoms.  (Patient goes by Catherine [name])

## 2014-11-14 ENCOUNTER — Ambulatory Visit (INDEPENDENT_AMBULATORY_CARE_PROVIDER_SITE_OTHER): Payer: Medicare Other | Admitting: Podiatry

## 2014-11-14 DIAGNOSIS — M79675 Pain in left toe(s): Secondary | ICD-10-CM

## 2014-11-14 DIAGNOSIS — B351 Tinea unguium: Secondary | ICD-10-CM | POA: Diagnosis not present

## 2014-11-14 DIAGNOSIS — E1151 Type 2 diabetes mellitus with diabetic peripheral angiopathy without gangrene: Secondary | ICD-10-CM

## 2014-11-14 DIAGNOSIS — E119 Type 2 diabetes mellitus without complications: Secondary | ICD-10-CM | POA: Diagnosis not present

## 2014-11-14 DIAGNOSIS — M79674 Pain in right toe(s): Secondary | ICD-10-CM

## 2014-11-14 DIAGNOSIS — M79676 Pain in unspecified toe(s): Secondary | ICD-10-CM

## 2014-11-14 NOTE — Progress Notes (Signed)
Patient ID: Catherine [name], female   DOB: April 20, 1928, 81 y.o.   MRN: [id number] HPI  Complaint:  Visit Type: Patient returns to my office for continued preventative foot care services. Complaint: Patient states" my nails have grown long and thick and become painful to walk and wear shoes" Patient has been diagnosed with DM with no complications. He presents for preventative foot care services. No changes to ROS  Podiatric Exam: Vascular: dorsalis pedis and posterior tibial pulses are negative. Capillary return is immediate. Temperature gradient is negative. ,  swelling medial aspect left ankle. Sensorium: Normal Semmes Weinstein monofilament test. Normal tactile sensation bilaterally.  Nail Exam: Pt has thick disfigured discolored nails with subungual debris noted bilateral entire nail hallux through fifth toenails Ulcer Exam: There is no evidence of ulcer or pre-ulcerative changes or infection. Orthopedic Exam: Muscle tone and strength are WNL. No limitations in general ROM. No crepitus or effusions noted. Foot type and digits show no abnormalities. Bony prominences are unremarkable. Skin: No Porokeratosis. No infection or ulcers  Diagnosis:  Tinea unguium, Pain in right toe, pain in left toes  Treatment & Plan Procedures and Treatment: Consent by patient was obtained for treatment procedures. The patient understood the discussion of treatment and procedures well. All questions were answered thoroughly reviewed. Debridement of mycotic and hypertrophic toenails, 1 through 5 bilateral and clearing of subungual debris. No ulceration, no infection noted.  Return Visit-Office Procedure: Patient instructed to return to the office for a follow up visit 3 months for continued evaluation and treatment.

## 2014-12-02 ENCOUNTER — Other Ambulatory Visit: Payer: Self-pay

## 2014-12-02 DIAGNOSIS — I8001 Phlebitis and thrombophlebitis of superficial vessels of right lower extremity: Secondary | ICD-10-CM

## 2014-12-09 ENCOUNTER — Encounter: Payer: Self-pay | Admitting: Vascular Surgery

## 2014-12-11 ENCOUNTER — Encounter: Payer: Self-pay | Admitting: Vascular Surgery

## 2014-12-11 ENCOUNTER — Ambulatory Visit (HOSPITAL_COMMUNITY)
Admission: RE | Admit: 2014-12-11 | Discharge: 2014-12-11 | Disposition: A | Discharge status: Home / Self Care | Payer: Medicare Other | Source: Ambulatory Visit | Attending: Vascular Surgery | Admitting: Vascular Surgery

## 2014-12-11 ENCOUNTER — Ambulatory Visit (INDEPENDENT_AMBULATORY_CARE_PROVIDER_SITE_OTHER): Payer: Medicare Other | Admitting: Vascular Surgery

## 2014-12-11 VITALS — BP 153/59 | HR 83 | Temp 97.7°F | Resp 14 | Ht 62.0 in | Wt 126.0 lb

## 2014-12-11 DIAGNOSIS — I872 Venous insufficiency (chronic) (peripheral): Secondary | ICD-10-CM

## 2014-12-11 DIAGNOSIS — I8001 Phlebitis and thrombophlebitis of superficial vessels of right lower extremity: Secondary | ICD-10-CM | POA: Diagnosis present

## 2014-12-11 NOTE — Progress Notes (Signed)
Referred by:  Chesley Noon, MD [address] [place], [place] [address]  Reason for referral: Foot discoloration and cold feet  History of Present Illness  Catherine [name] is a 81 y.o. ([birth year]) female who presents with chief complaint: progressive discoloration and coldness of feet for several months. She reports that her niece noticed the red discoloration and advised her to get evaluated. She denies any pain in her feet. She does report some swelling of her legs. She denies any history of DVT or non-healing wounds. She has no family history of venous disorders. She lives alone and ambulates with a walker. She is thinking of moving to an assisted living facility.   She has no history of stroke or cardiac disease. She has a history of diabetes well controlled on oral medications. Her hypertension is managed with an ACEI.   Past Medical History  Diagnosis Date  . Tremor   . Paralysis agitans 09/20/2012  . Sciatica of left side   . Obese   . Diabetes mellitus without complication   . Dyslipidemia   . Migraine without aura, without mention of intractable migraine without mention of status migrainosus   . Hypertension   . Arthritis   . Cancer     basal cell-forehead    Past Surgical History  Procedure Laterality Date  . Appendectomy    . Basal cell carcinoma resection      Forehead  . Mouth surgery    . Tonsillectomy    . Arthroscopic surgery      Left knee  . Trigger finger surgery      Bilateral thumb  . Cataract extraction w/phaco Right 11/19/2013    Procedure: CATARACT EXTRACTION PHACO AND INTRAOCULAR LENS PLACEMENT (IOC);  Surgeon: Tonny Branch, MD;  Location: [facility];  Service: Ophthalmology;  Laterality: Right;  CDE:  12.95  . Cataract extraction w/phaco Left 12/17/2013    Procedure: CATARACT EXTRACTION PHACO AND INTRAOCULAR LENS PLACEMENT (IOC);  Surgeon: Tonny Branch, MD;  Location: [facility];  Service: Ophthalmology;  Laterality: Left;  CDE 12.10    History    Social History  . Marital Status: Widowed    Spouse Name: N/A  . Number of Children: 0  . Years of Education: N/A   Occupational History  . Retired     Optometrist [employer]   Social History Main Topics  . Smoking status: Never Smoker   . Smokeless tobacco: Never Used  . Alcohol Use: No  . Drug Use: No  . Sexual Activity: Yes    Birth Control/ Protection: Post-menopausal   Other Topics Concern  . Not on file   Social History Narrative    Family History  Problem Relation Age of Onset  . Heart attack Father     not certain of COD  . Rheum arthritis Mother   . Hypertension Paternal Grandfather   . Stroke Paternal Grandfather   . Cirrhosis Paternal Grandmother     Current Outpatient Prescriptions on File Prior to Visit  Medication Sig Dispense Refill  . Alogliptin-Metformin HCl (KAZANO) 12.09-998 MG TABS Take 1 tablet by mouth daily.     Marland Kitchen aspirin 81 MG chewable tablet Chew 81 mg by mouth daily.    . diclofenac sodium (VOLTAREN) 1 % GEL Place onto the skin.    Marland Kitchen linagliptin (TRADJENTA) 5 MG TABS tablet Take 5 mg by mouth.    . meloxicam (MOBIC) 7.5 MG tablet Take 7.5 mg by mouth.    . Multiple Vitamin (  MULTIVITAMIN) tablet Take 1 tablet by mouth. Takes occasionally    . ramipril (ALTACE) 2.5 MG capsule Take 2.5 mg by mouth.    . naproxen sodium (ANAPROX) 220 MG tablet Take 220 mg by mouth daily.     No current facility-administered medications on file prior to visit.    Allergies  Allergen Reactions  . Sulfa Antibiotics Rash  . Azilect [Rasagiline] Rash  . Fenofibrate Rash  . Sinemet [Carbidopa-Levodopa] Rash    REVIEW OF SYSTEMS:  (Positives checked otherwise negative)  CARDIOVASCULAR:  []  chest pain, []  chest pressure, []  palpitations, []  shortness of breath when laying flat, []  shortness of breath with exertion,  []  pain in feet when walking, []  pain in feet when laying flat, []  history of blood clot in veins (DVT), []  history of phlebitis, [x]  swelling in  legs, []  varicose veins  PULMONARY:  []  productive cough, []  asthma, []  wheezing  NEUROLOGIC:  [x]  weakness in arms or legs (right hand/arm), []  numbness in arms or legs, []  difficulty speaking or slurred speech, []  temporary loss of vision in one eye, []  dizziness  HEMATOLOGIC:  [x]  bleeding problems, []  problems with blood clotting too easily  MUSCULOSKEL:  []  joint pain, []  joint swelling  GASTROINTEST:  []  vomiting blood, []  blood in stool     GENITOURINARY:  []  burning with urination, []  blood in urine  PSYCHIATRIC:  []  history of major depression  INTEGUMENTARY:  []  rashes, []  ulcers  CONSTITUTIONAL:  []  fever, []  chills   Physical Examination Filed Vitals:   12/11/14 1457 12/11/14 1501  BP: 150/72 153/59  Pulse: 90 83  Temp: 97.7 F (36.5 C)   Resp: 14   Height: 5\' 2"  (1.575 m)   Weight: 126 lb (57.153 kg)   SpO2: 100%    Body mass index is 23.04 kg/(m^2).  General: A&O x 3, WDWN female in NAD  Head: [place]/AT  Neck: Supple  Pulmonary: Sym exp, good air movt, CTAB, no rales, rhonchi, & wheezing  Cardiac: RRR, Nl S1, S2, no Murmurs, rubs or gallops, no carotid bruits  Vascular: 2+ radial and dorsalis pedis pulses bilaterally. Rubor of bilateral feet with hemosiderin staining of lower legs bilaterally. 1+ pitting edema lower legs bilaterally. No ulcerations. No ischemic changes.   Musculoskeletal: No marked muscle atrophy or wasting.   Neurologic: CN 2-12 grossly intact. Resting right hand tremor. Pain and light touch intact in extremities.   Psychiatric: Judgment intact, Mood & affect appropriate for pt's clinical situation  Dermatologic: See M/S exam for extremity exam, no rashes otherwise noted   Non-Invasive Vascular Imaging  BLE Venous Insufficiency Duplex (Date: 12/11/2014):   She has no evidence of right deep vein thrombosis or superficial vein thrombosis on today's duplex exam. She does have some great saphenous and common femoral reflux. She has a  mildly dilated great saphenous vein with diameters from 0.26 cm at knee  to 0.56 at saphenofemoral junction.   Outside Studies/Documentation 10 pages of outside documents were reviewed including: medical records from PCP  Jasper is a 81 y.o. female who presents with: bilateral chronic venous insufficiency.   Recommend elevation of legs for swelling. Do not recommend compression stockings as patient may have great difficulty putting them on. She has palpable pedal pulses on exam indicating adequate arterial function. Assured the patient that chronic venous insufficiency is common and not a dangerous condition. She will follow up on an as needed basis.    Virgina Jock,  PA-C [facility]: [phone] Pager: [phone]  12/11/2014, 3:36 PM  This patient was seen and examined in conjunction with Dr. Scot Dock

## 2014-12-11 NOTE — Progress Notes (Signed)
Filed Vitals:   12/11/14 1457 12/11/14 1501  BP: 150/72 153/59  Pulse: 90 83  Temp: 97.7 F (36.5 C)   Resp: 14   Height: 5\' 2"  (1.575 m)   Weight: 126 lb (57.153 kg)   SpO2: 100%

## 2014-12-13 ENCOUNTER — Ambulatory Visit: Payer: Medicare Other | Admitting: Podiatry

## 2014-12-21 ENCOUNTER — Emergency Department (HOSPITAL_COMMUNITY): Payer: Medicare Other

## 2014-12-21 ENCOUNTER — Observation Stay (HOSPITAL_COMMUNITY)
Admission: EM | Admit: 2014-12-21 | Discharge: 2014-12-23 | Disposition: A | Discharge status: Home / Self Care | Payer: Medicare Other | Attending: Internal Medicine | Admitting: Internal Medicine

## 2014-12-21 ENCOUNTER — Encounter (HOSPITAL_COMMUNITY): Payer: Self-pay | Admitting: *Deleted

## 2014-12-21 DIAGNOSIS — R531 Weakness: Secondary | ICD-10-CM

## 2014-12-21 DIAGNOSIS — I471 Supraventricular tachycardia: Secondary | ICD-10-CM | POA: Diagnosis present

## 2014-12-21 DIAGNOSIS — I4719 Other supraventricular tachycardia: Secondary | ICD-10-CM | POA: Diagnosis present

## 2014-12-21 DIAGNOSIS — G20 Parkinson's disease: Secondary | ICD-10-CM | POA: Insufficient documentation

## 2014-12-21 DIAGNOSIS — R270 Ataxia, unspecified: Principal | ICD-10-CM

## 2014-12-21 DIAGNOSIS — Z7982 Long term (current) use of aspirin: Secondary | ICD-10-CM | POA: Insufficient documentation

## 2014-12-21 DIAGNOSIS — I10 Essential (primary) hypertension: Secondary | ICD-10-CM | POA: Insufficient documentation

## 2014-12-21 DIAGNOSIS — E669 Obesity, unspecified: Secondary | ICD-10-CM | POA: Insufficient documentation

## 2014-12-21 DIAGNOSIS — E785 Hyperlipidemia, unspecified: Secondary | ICD-10-CM | POA: Diagnosis present

## 2014-12-21 DIAGNOSIS — Z859 Personal history of malignant neoplasm, unspecified: Secondary | ICD-10-CM | POA: Insufficient documentation

## 2014-12-21 DIAGNOSIS — G43009 Migraine without aura, not intractable, without status migrainosus: Secondary | ICD-10-CM | POA: Insufficient documentation

## 2014-12-21 DIAGNOSIS — Z79899 Other long term (current) drug therapy: Secondary | ICD-10-CM | POA: Insufficient documentation

## 2014-12-21 DIAGNOSIS — M1990 Unspecified osteoarthritis, unspecified site: Secondary | ICD-10-CM | POA: Insufficient documentation

## 2014-12-21 DIAGNOSIS — E119 Type 2 diabetes mellitus without complications: Secondary | ICD-10-CM

## 2014-12-21 LAB — BASIC METABOLIC PANEL
ANION GAP: 6 (ref 5–15)
BUN: 14 mg/dL (ref 6–20)
CO2: 25 mmol/L (ref 22–32)
Calcium: 8.9 mg/dL (ref 8.9–10.3)
Chloride: 110 mmol/L (ref 101–111)
Creatinine, Ser: 0.66 mg/dL (ref 0.44–1.00)
GFR calc non Af Amer: >60 mL/min (ref >60)
Glucose, Bld: 93 mg/dL (ref 65–99)
POTASSIUM: 3.5 mmol/L (ref 3.5–5.1)
Sodium: 141 mmol/L (ref 135–145)

## 2014-12-21 LAB — URINALYSIS, ROUTINE W REFLEX MICROSCOPIC
Bilirubin Urine: NEGATIVE
Glucose, UA: NEGATIVE mg/dL
Hgb urine dipstick: NEGATIVE
KETONES UR: NEGATIVE mg/dL
Leukocytes, UA: NEGATIVE
Nitrite: NEGATIVE
Protein, ur: NEGATIVE mg/dL
Specific Gravity, Urine: 1.025 (ref 1.005–1.030)
Urobilinogen, UA: 0.2 mg/dL (ref 0.0–1.0)
pH: 6 (ref 5.0–8.0)

## 2014-12-21 LAB — CBC WITH DIFFERENTIAL/PLATELET
Basophils Absolute: 0 10*3/uL (ref 0.0–0.1)
Basophils Relative: 1 % (ref 0–1)
Eosinophils Absolute: 0.3 10*3/uL (ref 0.0–0.7)
Eosinophils Relative: 5 % (ref 0–5)
HCT: 39 % (ref 36.0–46.0)
HEMOGLOBIN: 13 g/dL (ref 12.0–15.0)
Lymphocytes Relative: 39 % (ref 12–46)
Lymphs Abs: 2.3 10*3/uL (ref 0.7–4.0)
MCH: 31.3 pg (ref 26.0–34.0)
MCHC: 33.3 g/dL (ref 30.0–36.0)
MCV: 94 fL (ref 78.0–100.0)
Monocytes Absolute: 0.7 10*3/uL (ref 0.1–1.0)
Monocytes Relative: 11 % (ref 3–12)
Neutro Abs: 2.6 10*3/uL (ref 1.7–7.7)
Neutrophils Relative %: 44 % (ref 43–77)
PLATELETS: 161 10*3/uL (ref 150–400)
RBC: 4.15 MIL/uL (ref 3.87–5.11)
RDW: 13.2 % (ref 11.5–15.5)
WBC: 5.8 10*3/uL (ref 4.0–10.5)

## 2014-12-21 NOTE — ED Provider Notes (Signed)
CSN: [id number]     Arrival date & time 12/21/14  1940 History   First MD Initiated Contact with Patient 12/21/14 2122     Chief Complaint  Patient presents with  . Weakness     (Consider location/radiation/quality/duration/timing/severity/associated sxs/prior Treatment) HPI  Past Medical History  Diagnosis Date  . Tremor   . Paralysis agitans 09/20/2012  . Sciatica of left side   . Obese   . Diabetes mellitus without complication   . Dyslipidemia   . Migraine without aura, without mention of intractable migraine without mention of status migrainosus   . Hypertension   . Arthritis   . Cancer     basal cell-forehead   Past Surgical History  Procedure Laterality Date  . Appendectomy    . Basal cell carcinoma resection      Forehead  . Mouth surgery    . Tonsillectomy    . Arthroscopic surgery      Left knee  . Trigger finger surgery      Bilateral thumb  . Cataract extraction w/phaco Right 11/19/2013    Procedure: CATARACT EXTRACTION PHACO AND INTRAOCULAR LENS PLACEMENT (IOC);  Surgeon: Tonny Branch, MD;  Location: [facility];  Service: Ophthalmology;  Laterality: Right;  CDE:  12.95  . Cataract extraction w/phaco Left 12/17/2013    Procedure: CATARACT EXTRACTION PHACO AND INTRAOCULAR LENS PLACEMENT (IOC);  Surgeon: Tonny Branch, MD;  Location: [facility];  Service: Ophthalmology;  Laterality: Left;  CDE 12.10   Family History  Problem Relation Age of Onset  . Heart attack Father     not certain of COD  . Rheum arthritis Mother   . Hypertension Paternal Grandfather   . Stroke Paternal Grandfather   . Cirrhosis Paternal Grandmother    Social History  Substance Use Topics  . Smoking status: Never Smoker   . Smokeless tobacco: Never Used  . Alcohol Use: No   OB History    No data available     Review of Systems    Allergies  Sulfa antibiotics; Azilect; Fenofibrate; Adhesive; Sinemet; and Tomato  Home Medications   Prior to Admission medications   Medication Sig  Start Date End Date Taking? Authorizing Provider  aspirin 81 MG chewable tablet Chew 81 mg by mouth daily.   Yes Historical Provider, MD  Cyanocobalamin (VITAMIN B 12 PO) Take 1 tablet by mouth daily.   Yes Historical Provider, MD  naproxen sodium (ALEVE) 220 MG tablet Take 220 mg by mouth 2 (two) times daily as needed (Pain).   Yes Historical Provider, MD  ramipril (ALTACE) 2.5 MG capsule Take 2.5 mg by mouth. 02/14/14 02/14/15 Yes Historical Provider, MD  diclofenac sodium (VOLTAREN) 1 % GEL Place onto the skin. 05/17/14   Historical Provider, MD   BP 150/61 mmHg  Pulse 64  Temp(Src) 97.7 F (36.5 C) (Oral)  Resp 13  Ht 5' (1.524 m)  Wt 126 lb (57.153 kg)  BMI 24.61 kg/m2  SpO2 97% Physical Exam  ED Course  Procedures (including critical care time) Labs Review Labs Reviewed - No data to display  Imaging Review No results found. I, Orlie Dakin, personally reviewed and evaluated these images and lab results as part of my medical decision-making.   EKG Interpretation   Date/Time:  Saturday December 21 2014 19:43:06 EDT Ventricular Rate:  132 PR Interval:  136 QRS Duration: 98 QT Interval:  391 QTC Calculation: 579 R Axis:   -25 Text Interpretation:  Normal sinus rhythm Paired ventricular premature  complexes Consider right  atrial enlargement Borderline left axis deviation  Low voltage, precordial leads Abnormal R-wave progression, early  transition Nonspecific T abnormalities, lateral leads No significant  change since last tracing Confirmed by Winfred Leeds  MD, Dynesha Woolen [phone]) on  12/21/2014 8:02:52 PM      MDM   Final diagnoses:  None    Please delete. Duplicate note.     Orlie Dakin, MD 12/23/14 [phone]

## 2014-12-21 NOTE — ED Provider Notes (Signed)
CSN: [id number]     Arrival date & time 12/21/14  1940 History  This chart was scribed for Orlie Dakin, MD by Helane Gunther, ED Scribe. This patient was seen in room [facility] and the patient's care was started at 9:32 PM.    Chief Complaint  Patient presents with  . Weakness   The history is provided by the patient, a relative and a friend. No language interpreter was used.   HPI Comments: Catherine [name] is a 81 y.o. female who presents to the Emergency Department complaining of weakness, onset yesterday. She was able to stand today but was unable to move her legs. She denies pain anywhere. No treatment prior to coming here. No other associated symptoms. She normally walks with a walker and has a lift chair.. She states she was able to stand up, but could not move her feet or legs today. She walks with a walker and was able to walk yesterday. She ate 1 day ago (beans, watermelon). She lives alone. Per her relatives, she has had a doppler test done about a week ago, which came back normal. Her last BM was 3 days ago. She has a PMHx of DM. She does not smoke or drink alcohol. Pt denies pain, fever, melena, cough, and SOB. Nothing makes symptoms better or worse.  Past Medical History  Diagnosis Date  . Tremor   . Paralysis agitans 09/20/2012  . Sciatica of left side   . Obese   . Diabetes mellitus without complication   . Dyslipidemia   . Migraine without aura, without mention of intractable migraine without mention of status migrainosus   . Hypertension   . Arthritis   . Cancer     basal cell-forehead   Past Surgical History  Procedure Laterality Date  . Appendectomy    . Basal cell carcinoma resection      Forehead  . Mouth surgery    . Tonsillectomy    . Arthroscopic surgery      Left knee  . Trigger finger surgery      Bilateral thumb  . Cataract extraction w/phaco Right 11/19/2013    Procedure: CATARACT EXTRACTION PHACO AND INTRAOCULAR LENS PLACEMENT (IOC);  Surgeon:  Tonny Branch, MD;  Location: [facility];  Service: Ophthalmology;  Laterality: Right;  CDE:  12.95  . Cataract extraction w/phaco Left 12/17/2013    Procedure: CATARACT EXTRACTION PHACO AND INTRAOCULAR LENS PLACEMENT (IOC);  Surgeon: Tonny Branch, MD;  Location: [facility];  Service: Ophthalmology;  Laterality: Left;  CDE 12.10   Family History  Problem Relation Age of Onset  . Heart attack Father     not certain of COD  . Rheum arthritis Mother   . Hypertension Paternal Grandfather   . Stroke Paternal Grandfather   . Cirrhosis Paternal Grandmother    Social History  Substance Use Topics  . Smoking status: Never Smoker   . Smokeless tobacco: Never Used  . Alcohol Use: No   OB History    No data available     Review of Systems  Musculoskeletal: Positive for gait problem.  Neurological: Positive for tremors and weakness.       Chronic tremor right hand  All other systems reviewed and are negative.   Allergies  Sulfa antibiotics; Azilect; Fenofibrate; Adhesive; Sinemet; and Tomato  Home Medications   Prior to Admission medications   Medication Sig Start Date End Date Taking? Authorizing Provider  aspirin 81 MG chewable tablet Chew 81 mg by mouth daily.  Yes Historical Provider, MD  Cyanocobalamin (VITAMIN B 12 PO) Take 1 tablet by mouth daily.   Yes Historical Provider, MD  naproxen sodium (ALEVE) 220 MG tablet Take 220 mg by mouth 2 (two) times daily as needed (Pain).   Yes Historical Provider, MD  ramipril (ALTACE) 2.5 MG capsule Take 2.5 mg by mouth. 02/14/14 02/14/15 Yes Historical Provider, MD  diclofenac sodium (VOLTAREN) 1 % GEL Place onto the skin. 05/17/14   Historical Provider, MD   BP 117/68 mmHg  Pulse 66  Temp(Src) 97.7 F (36.5 C) (Oral)  Resp 18  Ht 5' (1.524 m)  Wt 126 lb (57.153 kg)  BMI 24.61 kg/m2  SpO2 97% Physical Exam  Constitutional: She is oriented to person, place, and time. No distress.  Frail-appearing  HENT:  Head: Normocephalic and atraumatic.  Eyes:  Conjunctivae are normal. Pupils are equal, round, and reactive to light.  Neck: Neck supple. No tracheal deviation present. No thyromegaly present.  Cardiovascular: Normal rate and regular rhythm.   No murmur heard. Pulmonary/Chest: Effort normal and breath sounds normal.  Abdominal: Soft. Bowel sounds are normal. She exhibits no distension. There is no tenderness.  Musculoskeletal: Normal range of motion. She exhibits no edema or tenderness.  Neurological: She is alert and oriented to person, place, and time. She has normal reflexes. Coordination normal.  Parkinsonian-like tremor of right hand and moves all extremities. DTRs symmetric bilaterally at knee jerk ankle jerk biceps was ordered bilaterally  Skin: Skin is warm and dry. No rash noted.  Psychiatric: She has a normal mood and affect.  Nursing note and vitals reviewed.   ED Course  Procedures  DIAGNOSTIC STUDIES: Oxygen Saturation is 97% on RA, normal by my interpretation.    COORDINATION OF CARE: 9:38 PM - Discussed plans to order diagnostic studies and possibly admit. Pt advised of plan for treatment and pt agrees.  Labs Review Labs Reviewed  BASIC METABOLIC PANEL  CBC WITH DIFFERENTIAL/PLATELET  URINALYSIS, ROUTINE W REFLEX MICROSCOPIC (NOT AT [facility])    Imaging Review Ct Head Wo Contrast  12/21/2014   CLINICAL DATA:  Weakness and ataxia beginning yesterday.  EXAM: CT HEAD WITHOUT CONTRAST  TECHNIQUE: Contiguous axial images were obtained from the base of the skull through the vertex without intravenous contrast.  COMPARISON:  None.  FINDINGS: Mild atrophy and white matter hypoattenuation is within normal limits for age. No acute cortical infarct, hemorrhage, or mass lesion is present. The basal ganglia are intact. The insular cortex is intact bilaterally. No significant extra-axial fluid collection is present. The ventricles are proportionate to the degree of atrophy.  The paranasal sinuses and mastoid air cells are clear. The  calvarium is intact. No significant extra cranial soft tissue lesion is present. Bilateral lens replacements are noted.  IMPRESSION: 1. Normal CT appearance of the brain for age.   Electronically Signed   By: San Morelle M.D.   On: 12/21/2014 23:39   I, Helane Gunther, personally reviewed and evaluated these images and lab results as part of my medical decision-making.   EKG Interpretation   Date/Time:  Saturday December 21 2014 19:43:06 EDT Ventricular Rate:  132 PR Interval:  136 QRS Duration: 98 QT Interval:  391 QTC Calculation: 579 R Axis:   -25 Text Interpretation:  Normal sinus rhythm Paired ventricular premature  complexes Consider right atrial enlargement Borderline left axis deviation  Low voltage, precordial leads Abnormal R-wave progression, early  transition Nonspecific T abnormalities, lateral leads No significant  change since last tracing  Confirmed by Winfred Leeds  MD, [place] [phone]) on  12/21/2014 8:02:52 PM     Results for orders placed or performed during the hospital encounter of 09/40/76  Basic metabolic panel  Result Value Ref Range   Sodium 141 135 - 145 mmol/L   Potassium 3.5 3.5 - 5.1 mmol/L   Chloride 110 101 - 111 mmol/L   CO2 25 22 - 32 mmol/L   Glucose, Bld 93 65 - 99 mg/dL   BUN 14 6 - 20 mg/dL   Creatinine, Ser 0.66 0.44 - 1.00 mg/dL   Calcium 8.9 8.9 - 10.3 mg/dL   GFR calc non Af Amer >60 >60 mL/min   GFR calc Af Amer >60 >60 mL/min   Anion gap 6 5 - 15  CBC with Differential/Platelet  Result Value Ref Range   WBC 5.8 4.0 - 10.5 K/uL   RBC 4.15 3.87 - 5.11 MIL/uL   Hemoglobin 13.0 12.0 - 15.0 g/dL   HCT 39.0 36.0 - 46.0 %   MCV 94.0 78.0 - 100.0 fL   MCH 31.3 26.0 - 34.0 pg   MCHC 33.3 30.0 - 36.0 g/dL   RDW 13.2 11.5 - 15.5 %   Platelets 161 150 - 400 K/uL   Neutrophils Relative % 44 43 - 77 %   Neutro Abs 2.6 1.7 - 7.7 K/uL   Lymphocytes Relative 39 12 - 46 %   Lymphs Abs 2.3 0.7 - 4.0 K/uL   Monocytes Relative 11 3 - 12 %    Monocytes Absolute 0.7 0.1 - 1.0 K/uL   Eosinophils Relative 5 0 - 5 %   Eosinophils Absolute 0.3 0.0 - 0.7 K/uL   Basophils Relative 1 0 - 1 %   Basophils Absolute 0.0 0.0 - 0.1 K/uL  Urinalysis, Routine w reflex microscopic (not at [facility])  Result Value Ref Range   Color, Urine YELLOW YELLOW   APPearance CLEAR CLEAR   Specific Gravity, Urine 1.025 1.005 - 1.030   pH 6.0 5.0 - 8.0   Glucose, UA NEGATIVE NEGATIVE mg/dL   Hgb urine dipstick NEGATIVE NEGATIVE   Bilirubin Urine NEGATIVE NEGATIVE   Ketones, ur NEGATIVE NEGATIVE mg/dL   Protein, ur NEGATIVE NEGATIVE mg/dL   Urobilinogen, UA 0.2 0.0 - 1.0 mg/dL   Nitrite NEGATIVE NEGATIVE   Leukocytes, UA NEGATIVE NEGATIVE   Ct Head Wo Contrast  12/21/2014   CLINICAL DATA:  Weakness and ataxia beginning yesterday.  EXAM: CT HEAD WITHOUT CONTRAST  TECHNIQUE: Contiguous axial images were obtained from the base of the skull through the vertex without intravenous contrast.  COMPARISON:  None.  FINDINGS: Mild atrophy and white matter hypoattenuation is within normal limits for age. No acute cortical infarct, hemorrhage, or mass lesion is present. The basal ganglia are intact. The insular cortex is intact bilaterally. No significant extra-axial fluid collection is present. The ventricles are proportionate to the degree of atrophy.  The paranasal sinuses and mastoid air cells are clear. The calvarium is intact. No significant extra cranial soft tissue lesion is present. Bilateral lens replacements are noted.  IMPRESSION: 1. Normal CT appearance of the brain for age.   Electronically Signed   By: San Morelle M.D.   On: 12/21/2014 23:39    MDM  I suspect the patient likely has Parkinson's disease. She'll likely need  neurologic evaluation Final diagnoses:  None   I spoke with Dr. Leonel Ramsay, neurologist at [facility] who does not feel the patient needs emergent transfer to [facility].  Neurology is available at this  hospital on 12/23/2014. Spoke with Dr.Chiu who will arrange for admission to medical surgical floor Diagnosis ataxia        Orlie Dakin, MD 12/22/14 3888

## 2014-12-21 NOTE — ED Notes (Signed)
EKG done and given to [place]. Patient on cardiac monitor.
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
## 2014-12-21 NOTE — ED Notes (Signed)
EKG done and given to

## 2014-12-22 ENCOUNTER — Encounter (HOSPITAL_COMMUNITY): Payer: Self-pay | Admitting: *Deleted

## 2014-12-22 DIAGNOSIS — R531 Weakness: Secondary | ICD-10-CM | POA: Diagnosis not present

## 2014-12-22 DIAGNOSIS — E119 Type 2 diabetes mellitus without complications: Secondary | ICD-10-CM | POA: Diagnosis not present

## 2014-12-22 DIAGNOSIS — E785 Hyperlipidemia, unspecified: Secondary | ICD-10-CM

## 2014-12-22 DIAGNOSIS — R270 Ataxia, unspecified: Secondary | ICD-10-CM | POA: Diagnosis not present

## 2014-12-22 LAB — COMPREHENSIVE METABOLIC PANEL
ALT: 10 U/L — ABNORMAL LOW (ref 14–54)
AST: 14 U/L — AB (ref 15–41)
Albumin: 3.6 g/dL (ref 3.5–5.0)
Alkaline Phosphatase: 32 U/L — ABNORMAL LOW (ref 38–126)
Anion gap: 7 (ref 5–15)
BILIRUBIN TOTAL: 0.9 mg/dL (ref 0.3–1.2)
BUN: 12 mg/dL (ref 6–20)
CALCIUM: 8.8 mg/dL — AB (ref 8.9–10.3)
CHLORIDE: 107 mmol/L (ref 101–111)
CO2: 26 mmol/L (ref 22–32)
Creatinine, Ser: 0.59 mg/dL (ref 0.44–1.00)
GFR calc Af Amer: >60 mL/min (ref >60)
GLUCOSE: 91 mg/dL (ref 65–99)
Potassium: 3.4 mmol/L — ABNORMAL LOW (ref 3.5–5.1)
Sodium: 140 mmol/L (ref 135–145)
Total Protein: 5.8 g/dL — ABNORMAL LOW (ref 6.5–8.1)

## 2014-12-22 LAB — GLUCOSE, CAPILLARY
GLUCOSE-CAPILLARY: 95 mg/dL (ref 65–99)
GLUCOSE-CAPILLARY: 105 mg/dL — AB (ref 65–99)
GLUCOSE-CAPILLARY: 97 mg/dL (ref 65–99)
GLUCOSE-CAPILLARY: 84 mg/dL (ref 65–99)
Glucose-Capillary: 81 mg/dL (ref 65–99)

## 2014-12-22 LAB — LIPID PANEL
CHOL/HDL RATIO: 4.5 ratio
CHOLESTEROL: 161 mg/dL (ref 0–200)
HDL: 36 mg/dL — AB (ref >40)
LDL Cholesterol: 110 mg/dL — ABNORMAL HIGH (ref 0–99)
Triglycerides: 73 mg/dL (ref <150)
VLDL: 15 mg/dL (ref 0–40)

## 2014-12-22 LAB — CBC
HCT: 39.5 % (ref 36.0–46.0)
Hemoglobin: 13.4 g/dL (ref 12.0–15.0)
MCH: 31.9 pg (ref 26.0–34.0)
MCHC: 33.9 g/dL (ref 30.0–36.0)
MCV: 94 fL (ref 78.0–100.0)
PLATELETS: 164 10*3/uL (ref 150–400)
RBC: 4.2 MIL/uL (ref 3.87–5.11)
RDW: 13.2 % (ref 11.5–15.5)
WBC: 5.2 10*3/uL (ref 4.0–10.5)

## 2014-12-22 LAB — TSH: TSH: 3.172 u[IU]/mL (ref 0.350–4.500)

## 2014-12-22 LAB — VITAMIN B12: Vitamin B-12: 233 pg/mL (ref 180–914)

## 2014-12-22 MED ORDER — ONDANSETRON HCL 4 MG PO TABS: 4.0000 mg | ORAL_TABLET | Freq: Four times a day (QID) | ORAL | Status: DC | PRN

## 2014-12-22 MED ORDER — INSULIN ASPART 100 UNIT/ML ~~LOC~~ SOLN: 0.0000 [IU] | Freq: Every day | SUBCUTANEOUS | Status: DC

## 2014-12-22 MED ORDER — ACETAMINOPHEN 325 MG PO TABS
650.0000 mg | ORAL_TABLET | Freq: Four times a day (QID) | ORAL | Status: DC | PRN
  Administered 2014-12-22: 650 mg via ORAL
  Filled 2014-12-22: qty 2

## 2014-12-22 MED ORDER — INSULIN ASPART 100 UNIT/ML ~~LOC~~ SOLN: 0.0000 [IU] | Freq: Three times a day (TID) | SUBCUTANEOUS | Status: DC

## 2014-12-22 MED ORDER — ONDANSETRON HCL 4 MG/2ML IJ SOLN: 4.0000 mg | Freq: Four times a day (QID) | INTRAMUSCULAR | Status: DC | PRN

## 2014-12-22 MED ORDER — ACETAMINOPHEN 650 MG RE SUPP: 650.0000 mg | Freq: Four times a day (QID) | RECTAL | Status: DC | PRN

## 2014-12-22 MED ORDER — HEPARIN SODIUM (PORCINE) 5000 UNIT/ML IJ SOLN
5000.0000 [IU] | Freq: Three times a day (TID) | INTRAMUSCULAR | Status: DC
  Administered 2014-12-22 – 2014-12-23 (×4): 5000 [IU] via SUBCUTANEOUS
  Filled 2014-12-22 (×4): qty 1

## 2014-12-22 MED ORDER — TUBERCULIN PPD 5 UNIT/0.1ML ID SOLN
5.0000 [IU] | Freq: Once | INTRADERMAL | Status: DC
  Administered 2014-12-22: 5 [IU] via INTRADERMAL
  Filled 2014-12-22: qty 0.1

## 2014-12-22 MED ORDER — MORPHINE SULFATE 2 MG/ML IJ SOLN: 2.0000 mg | INTRAMUSCULAR | Status: DC | PRN

## 2014-12-22 MED ORDER — POTASSIUM CHLORIDE CRYS ER 20 MEQ PO TBCR
40.0000 meq | EXTENDED_RELEASE_TABLET | Freq: Once | ORAL | Status: AC
  Administered 2014-12-22: 40 meq via ORAL
  Filled 2014-12-22: qty 2

## 2014-12-22 NOTE — Progress Notes (Signed)
TB skin test administered in right anterior forearm.  Patient educated and advised to have it read within 48-72 hours.  If she is discharged before the read time, she knows to go to her PCP to have it read.

## 2014-12-22 NOTE — H&P (Addendum)
[facility] History and Physical  Catherine [name] ZOX:[id number] DOB: [birth year] DOA: 12/21/2014  Referring physician: Emergency department PCP: Chesley Noon, MD  Specialists:   Chief Complaint: Weakness  HPI: Catherine [name] is a 81 y.o. female  With a hx of tremors, obesity, DM, HLD, arthritis, who normally ambulates with a walker who presents with increased weakness whereby she is unable to move her LE. In the ED, CT head was found to be unremarkable.   Of note, pt reports chronic hx of R hand tremor. States she "may have injured my nerve when I tried lifting my husband before he died."  Also of note, pt had been followed by [facility], Dr. Jannifer Franklin, for hx of R sided Parkinson's, last seen in 5/15 where she was recommended to take Mirapex, however is currently not taking.  Hospitalist consulted for consideration for admission given worsening weakness.  Review of Systems:  Review of Systems  Constitutional: Positive for malaise/fatigue. Negative for chills.  HENT: Negative for congestion and tinnitus.   Eyes: Negative for pain and discharge.  Respiratory: Negative for shortness of breath and wheezing.   Cardiovascular: Negative for palpitations and claudication.  Gastrointestinal: Negative for nausea and abdominal pain.  Genitourinary: Negative for urgency and hematuria.  Musculoskeletal: Negative for back pain and joint pain.  Skin: Negative for itching and rash.  Neurological: Positive for tremors and weakness. Negative for tingling, seizures and loss of consciousness.  Psychiatric/Behavioral: Negative for memory loss and substance abuse.     Past Medical History  Diagnosis Date  . Tremor   . Paralysis agitans 09/20/2012  . Sciatica of left side   . Obese   . Diabetes mellitus without complication   . Dyslipidemia   . Migraine without aura, without mention of intractable migraine without mention of status migrainosus   . Hypertension   . Arthritis   .  Cancer     basal cell-forehead   Past Surgical History  Procedure Laterality Date  . Appendectomy    . Basal cell carcinoma resection      Forehead  . Mouth surgery    . Tonsillectomy    . Arthroscopic surgery      Left knee  . Trigger finger surgery      Bilateral thumb  . Cataract extraction w/phaco Right 11/19/2013    Procedure: CATARACT EXTRACTION PHACO AND INTRAOCULAR LENS PLACEMENT (IOC);  Surgeon: Tonny Branch, MD;  Location: [facility];  Service: Ophthalmology;  Laterality: Right;  CDE:  12.95  . Cataract extraction w/phaco Left 12/17/2013    Procedure: CATARACT EXTRACTION PHACO AND INTRAOCULAR LENS PLACEMENT (IOC);  Surgeon: Tonny Branch, MD;  Location: [facility];  Service: Ophthalmology;  Laterality: Left;  CDE 12.10   Social History:  reports that she has never smoked. She has never used smokeless tobacco. She reports that she does not drink alcohol or use illicit drugs.  where does patient live--home, ALF, SNF? and with whom if at home?  Can patient participate in ADLs?  Allergies  Allergen Reactions  . Sulfa Antibiotics Rash  . Azilect [Rasagiline] Rash  . Fenofibrate Rash  . Adhesive [Tape] Rash and Other (See Comments)    Redness  . Sinemet [Carbidopa-Levodopa] Rash  . Tomato Rash    Family History  Problem Relation Age of Onset  . Heart attack Father     not certain of COD  . Rheum arthritis Mother   . Hypertension Paternal Grandfather   . Stroke Paternal Grandfather   . Cirrhosis Paternal  Grandmother     (be sure to complete)  Prior to Admission medications   Medication Sig Start Date End Date Taking? Authorizing Provider  aspirin 81 MG chewable tablet Chew 81 mg by mouth daily.   Yes Historical Provider, MD  Cyanocobalamin (VITAMIN B 12 PO) Take 1 tablet by mouth daily.   Yes Historical Provider, MD  naproxen sodium (ALEVE) 220 MG tablet Take 220 mg by mouth 2 (two) times daily as needed (Pain).   Yes Historical Provider, MD  ramipril (ALTACE) 2.5 MG capsule Take  2.5 mg by mouth. 02/14/14 02/14/15 Yes Historical Provider, MD  diclofenac sodium (VOLTAREN) 1 % GEL Place onto the skin. 05/17/14   Historical Provider, MD   Physical Exam: Filed Vitals:   12/22/14 0030 12/22/14 0045 12/22/14 0109 12/22/14 0549  BP: 138/56  145/63 149/60  Pulse: 61 66 68 60  Temp:   98.1 F (36.7 C) 97.5 F (36.4 C)  TempSrc:   Oral Oral  Resp:   16 20  Height:   5' (1.524 m)   Weight:   55.7 kg (122 lb 12.7 oz)   SpO2: 94% 95% 97% 98%     General:  Awake, in nad  Eyes: PERRL B  ENT: membranes moist, dentition fair  Neck: trachea midline, neck supple  Cardiovascular: regular, s1, s2  Respiratory: normal resp effort, no wheezing  Abdomen: soft, nondistended  Skin: normal skin turgor, no abnormal skin lesions seen  Musculoskeletal: perfused, no clubbing  Psychiatric: mood/affect normal// no auditory/visual hallucinations  Neurologic: cn2-12 grossly intact,strength/sensation intact  Labs on Admission:  Basic Metabolic Panel:  Recent Labs Lab 12/21/14 2222 12/22/14 0543  NA 141 140  K 3.5 3.4*  CL 110 107  CO2 25 26  GLUCOSE 93 91  BUN 14 12  CREATININE 0.66 0.59  CALCIUM 8.9 8.8*   Liver Function Tests:  Recent Labs Lab 12/22/14 0543  AST 14*  ALT 10*  ALKPHOS 32*  BILITOT 0.9  PROT 5.8*  ALBUMIN 3.6   No results for input(s): LIPASE, AMYLASE in the last 168 hours. No results for input(s): AMMONIA in the last 168 hours. CBC:  Recent Labs Lab 12/21/14 2222 12/22/14 0543  WBC 5.8 5.2  NEUTROABS 2.6  --   HGB 13.0 13.4  HCT 39.0 39.5  MCV 94.0 94.0  PLT 161 164   Cardiac Enzymes: No results for input(s): CKTOTAL, CKMB, CKMBINDEX, TROPONINI in the last 168 hours.  BNP (last 3 results) No results for input(s): BNP in the last 8760 hours.  ProBNP (last 3 results) No results for input(s): PROBNP in the last 8760 hours.  CBG:  Recent Labs Lab 12/22/14 0222  GLUCAP 95    Radiological Exams on Admission: Ct Head Wo  Contrast  12/21/2014   CLINICAL DATA:  Weakness and ataxia beginning yesterday.  EXAM: CT HEAD WITHOUT CONTRAST  TECHNIQUE: Contiguous axial images were obtained from the base of the skull through the vertex without intravenous contrast.  COMPARISON:  None.  FINDINGS: Mild atrophy and white matter hypoattenuation is within normal limits for age. No acute cortical infarct, hemorrhage, or mass lesion is present. The basal ganglia are intact. The insular cortex is intact bilaterally. No significant extra-axial fluid collection is present. The ventricles are proportionate to the degree of atrophy.  The paranasal sinuses and mastoid air cells are clear. The calvarium is intact. No significant extra cranial soft tissue lesion is present. Bilateral lens replacements are noted.  IMPRESSION: 1. Normal CT appearance of the  brain for age.   Electronically Signed   By: San Morelle M.D.   On: 12/21/2014 23:39     Assessment/Plan Principal Problem:   Weakness Active Problems:   Type 2 diabetes mellitus   Hyperlipidemia   PAT (paroxysmal atrial tachycardia)   Ataxia   1. Increased Weakness with hx of R sided Parkinson's 1. Question possible worsening Parkinsons? 2. CT head unremarkable 3. EDP (Dr. Winfred Leeds) had discussed with Neurology (Dr. Leonel Ramsay) who states transfer to [facility] is not necessary at this time 4. Will consult PT/OT 5. Admit to floor 2. DM2 1. Will cont on SSI coverage 3. HLD 1. Not on statin prior to admit 2. Will check lipids 4. Hx tachycardia 1. Stable at present 5. DVT prophylaxis 1. Heparin subq  Code Status: DNR - pt states "When my heart stops, just let me be with the Reita Cliche." Family Communication: Pt in room Disposition Plan: admit to floor   [place] [place] [facility] Pager [phone]  If 7PM-7AM, please contact night-coverage [email or web address] Password [email or web address] 12/22/2014, 7:09 AM

## 2014-12-22 NOTE — Progress Notes (Signed)
Patient seen and examined. Discussed with nephew at bedside. Was admitted overnight for increased weakness. Etiology remains unclear to me immediately. She does have a history of Parkinson's. There is no evidence for infection, she is not focal so I do not suspect a CVA. Will order TSH, B12. PT and OT consultations are also pending. Interestingly, patient's nephew tells me that she has already secured a spot to move into an assisted living facility sometime next week. We'll continue to follow.  Domingo Mend, MD [facility] Pager: [phone]

## 2014-12-23 ENCOUNTER — Inpatient Hospital Stay
Admission: RE | Admit: 2014-12-23 | Discharge: 2015-01-09 | Disposition: A | Discharge status: Home / Self Care | Payer: Medicare Other | Source: Ambulatory Visit | Attending: Internal Medicine | Admitting: Internal Medicine

## 2014-12-23 DIAGNOSIS — R531 Weakness: Secondary | ICD-10-CM | POA: Diagnosis not present

## 2014-12-23 LAB — GLUCOSE, CAPILLARY
GLUCOSE-CAPILLARY: 106 mg/dL — AB (ref 65–99)
Glucose-Capillary: 91 mg/dL (ref 65–99)

## 2014-12-23 LAB — HEMOGLOBIN A1C
HEMOGLOBIN A1C: 6.3 % — AB (ref 4.8–5.6)
MEAN PLASMA GLUCOSE: 134 mg/dL

## 2014-12-23 MED ORDER — CARBIDOPA-LEVODOPA 25-100 MG PO TABS: 1.0000 | ORAL_TABLET | Freq: Three times a day (TID) | ORAL | Status: DC

## 2014-12-23 MED ORDER — PREDNISONE 20 MG PO TABS: 20.0000 mg | ORAL_TABLET | Freq: Every day | ORAL | Status: DC

## 2014-12-23 NOTE — Clinical Social Work Placement (Signed)
   CLINICAL SOCIAL WORK PLACEMENT  NOTE  Date:  12/23/2014  Patient Details  Name: Catherine [name] MRN: [id number] Date of Birth: [birth year]  Clinical Social Work is seeking post-discharge placement for this patient at the   level of care (*CSW will initial, date and re-position this form in  chart as items are completed):  Yes   Patient/family provided with [place] Work Department's list of facilities offering this level of care within the geographic area requested by the patient (or if unable, by the patient's family).  Yes   Patient/family informed of their freedom to choose among providers that offer the needed level of care, that participate in Medicare, Medicaid or managed care program needed by the patient, have an available bed and are willing to accept the patient.  Yes   Patient/family informed of [place]'s ownership interest in [facility] and [facility], as well as of the fact that they are under no obligation to receive care at these facilities.  PASRR submitted to EDS on 12/23/14     PASRR number received on 12/23/14     Existing PASRR number confirmed on       FL2 transmitted to all facilities in geographic area requested by pt/family on 12/23/14     FL2 transmitted to all facilities within larger geographic area on       Patient informed that his/her managed care company has contracts with or will negotiate with certain facilities, including the following:        Yes   Patient/family informed of bed offers received.  Patient chooses bed at [facility]     Physician recommends and patient chooses bed at      Patient to be transferred to [facility] on 12/23/14.  Patient to be transferred to facility by [facility] hospital staff     Patient family notified on 12/23/14 of transfer.  Name of family member notified:  At patient request, patient's neighbor who was at bedside was notified when CSW made bed offer.  Patient  requested that [place] not contact her nephew, Jenny Reichmann, as she would notifiy him upon his return to the hospital.     PHYSICIAN       Additional Comment:    _______________________________________________ Ihor Gully, LCSW 12/23/2014, 1:03 PM [phone]

## 2014-12-23 NOTE — Discharge Summary (Signed)
Physician Discharge Summary  Catherine [name]:[id number] DOB: 1928/02/28 DOA: 12/21/2014  PCP: Chesley Noon, MD  Admit date: 12/21/2014 Discharge date: 12/23/2014  Time spent: 35 minutes  Recommendations for Outpatient Follow-up:   Follow up with neurologist, Dr Jannifer Franklin, to discuss Parkinson's management and medications.   Follow up with PCP to address hyperlipidemia. LDL 110.   Discharge Diagnoses:  Principal Problem:   Weakness Active Problems:   Type 2 diabetes mellitus   Hyperlipidemia   PAT (paroxysmal atrial tachycardia)   Ataxia   Discharge Condition: Improved  Filed Weights   12/21/14 1937 12/22/14 0109  Weight: 57.153 kg (126 lb) 55.7 kg (122 lb 12.7 oz)    History of present illness:  History of tremors, obesity, DM, HLD, arthritis, who normally ambulates with a walker who presents with increased weakness whereby she is unable to move her LE. In the ED, CT head was found to be unremarkable.Of note, pt reports chronic hx of R hand tremor. States she "may have injured my nerve when I tried lifting my husband before he died." Also of note, pt had been followed by [facility], Dr. Jannifer Franklin, for hx of R sided Parkinson's, last seen in 5/15 where she was recommended to take Mirapex, however is currently not taking.  Hospital Course:   Increased weakness with history of R sided Parkinson's -Etiology was unclear, possible worsening Parkinson's. There is no evidence of infection.  -CT Head was unremarkable.  -All labs were unremarkable. -OT/PT recommends discharge to SNF. Is placed on fall precautions and weightbearing restrictions.  -Neurology (Dr. Leonel Ramsay) stated that a transfer to [facility] is not necessary at this time  DM type 2 -Fair control.  HLD -LDL 110 -Not on statins prior to admission.  Procedures:  None   Consultations:  None  Discharge Instructions      Discharge Instructions    Diet - low sodium heart healthy    Complete  by:  As directed      Increase activity slowly    Complete by:  As directed             Medication List    TAKE these medications        ALEVE 220 MG tablet  Generic drug:  naproxen sodium  Take 220 mg by mouth 2 (two) times daily as needed (Pain).     aspirin 81 MG chewable tablet  Chew 81 mg by mouth daily.     diclofenac sodium 1 % Gel  Commonly known as:  VOLTAREN  Place onto the skin.     ramipril 2.5 MG capsule  Commonly known as:  ALTACE  Take 2.5 mg by mouth.     VITAMIN B 12 PO  Take 1 tablet by mouth daily.       Allergies  Allergen Reactions  . Sulfa Antibiotics Rash  . Azilect [Rasagiline] Rash  . Fenofibrate Rash  . Adhesive [Tape] Rash and Other (See Comments)    Redness  . Sinemet [Carbidopa-Levodopa] Rash  . Tomato Rash      The results of significant diagnostics from this hospitalization (including imaging, microbiology, ancillary and laboratory) are listed below for reference.    Significant Diagnostic Studies: Ct Head Wo Contrast  12/21/2014   CLINICAL DATA:  Weakness and ataxia beginning yesterday.  EXAM: CT HEAD WITHOUT CONTRAST  TECHNIQUE: Contiguous axial images were obtained from the base of the skull through the vertex without intravenous contrast.  COMPARISON:  None.  FINDINGS: Mild atrophy and  white matter hypoattenuation is within normal limits for age. No acute cortical infarct, hemorrhage, or mass lesion is present. The basal ganglia are intact. The insular cortex is intact bilaterally. No significant extra-axial fluid collection is present. The ventricles are proportionate to the degree of atrophy.  The paranasal sinuses and mastoid air cells are clear. The calvarium is intact. No significant extra cranial soft tissue lesion is present. Bilateral lens replacements are noted.  IMPRESSION: 1. Normal CT appearance of the brain for age.   Electronically Signed   By: San Morelle M.D.   On: 12/21/2014 23:39    Microbiology: No  results found for this or any previous visit (from the past 240 hour(s)).   Labs: Basic Metabolic Panel:  Recent Labs Lab 12/21/14 2222 12/22/14 0543  NA 141 140  K 3.5 3.4*  CL 110 107  CO2 25 26  GLUCOSE 93 91  BUN 14 12  CREATININE 0.66 0.59  CALCIUM 8.9 8.8*   Liver Function Tests:  Recent Labs Lab 12/22/14 0543  AST 14*  ALT 10*  ALKPHOS 32*  BILITOT 0.9  PROT 5.8*  ALBUMIN 3.6   CBC:  Recent Labs Lab 12/21/14 2222 12/22/14 0543  WBC 5.8 5.2  NEUTROABS 2.6  --   HGB 13.0 13.4  HCT 39.0 39.5  MCV 94.0 94.0  PLT 161 164    CBG:  Recent Labs Lab 12/22/14 0708 12/22/14 1142 12/22/14 1628 12/22/14 2318 12/23/14 0732  GLUCAP 105* 97 81 84 91    Signed:  Domingo Mend, MD   [facility] Pager: [phone] 12/23/2014, 11:33 AM  I, Laban Emperor. Leonie Green, acting as scribe, recorded this note contemporaneously in the presence of Dr. Domingo Mend, M.D. on 12/23/2014.   I have reviewed the above documentation for accuracy and completeness, and I agree with the above.  Domingo Mend, MD [facility] Pager: [phone]

## 2014-12-23 NOTE — Progress Notes (Signed)
Patient discharged to [facility] for rehab.  Report was given to [facility], RN at [facility].  Catherine [name], verbalized understanding with no questions or concerns voiced at this time.  IV was removed with catheter intact, no bleeding or complications.  Patient left unit in stable condition by a staff member in a wheelchair.  Pt's nephew was notified that patient was discharged to [facility].

## 2014-12-23 NOTE — Care Management Note (Signed)
Case Management Note  Patient Details  Name: LAKEYTA VANDENHEUVEL MRN: [id number] Date of Birth: 1927-12-17  Subjective/Objective:                  Pt admitted from home with progressive weakness. Pt has been living alone and has a nephew that is very active in the care of the pt. Pt was scheduled to be admitted to [place] on 12/26/14 due to pt not able to live alone anymore.  Action/Plan: PT recommends SNF. CSW is aware and will arrange discharge to facility today.   Expected Discharge Date:                  Expected Discharge Plan:  Skilled Nursing Facility  In-House Referral:  Clinical Social Work  Discharge planning Services  CM Consult  Post Acute Care Choice:  NA Choice offered to:  NA  DME Arranged:    DME Agency:     HH Arranged:    [place] Agency:     Status of Service:  Completed, signed off  Medicare Important Message Given:    Date Medicare IM Given:    Medicare IM give by:    Date Additional Medicare IM Given:    Additional Medicare Important Message give by:     If discussed at [place] of Stay Meetings, dates discussed:    Additional Comments:  Joylene Draft, RN 12/23/2014, 11:45 AM

## 2014-12-23 NOTE — Clinical Social Work Note (Signed)
Clinical Social Work Assessment  Patient Details  Name: Catherine [name] MRN: [id number] Date of Birth: 05/25/1927  Date of referral:  12/23/14               Reason for consult:  Facility Placement                Permission sought to share information with:    Permission granted to share information::     Name::        Agency::     Relationship::     Contact Information:     Housing/Transportation Living arrangements for the past 2 months:  [place] of Information:  Patient, Other (Comment Required) (Nephew and Nephew's wife, Catherine [name] and Catherine [name]) Patient Interpreter Needed:  None Criminal Activity/Legal Involvement Pertinent to Current Situation/Hospitalization:  No - Comment as needed Significant Relationships:  Other Family Members Lives with:  Self Do you feel safe going back to the place where you live?  Yes Need for family participation in patient care:  Yes (Comment)  Care giving concerns: Prior to this hospitalization, patient was in the process of addressing her caregiving concerns and was planning on going to [place] on 12/26/14.   Social Worker assessment / plan:  CSW met with patient, her nephew and his wife Catherine [name] and Catherine [name]) were at  Bedside completing paperwork for [facility].  CSW discussed the purpose of the CSW consult being short term rehab in a SNF.  Patient advised that she was willing to go to a SNF for short term rehab.  Patient advised that she lives alone. She stated that she typically ambulates with a cane, walker and/or a wheelchair.  She stated that she completed ADLs independently.  Patient advised her plan is to go to SNF for rehab then go to [facility] where she will be a long term resident.  CSW provided a SNF list.  Patient advised that her first preference would be [facility] then her second choice would be [facility].   CSW faxed clinicals to [facility] and [facility].   CSW received be offer from [facility].  CSW provided bed offer to  patient.  CSW discussed contacting patient's nephew regarding patient's accepting the bed offer and patient requested that CSW not contact her nephew as she had gotten frustrated with his wife.  Patient advised that she would tell her nephew when he came back to the Hospital.  Patient's neighbor, who is listed on the chart, was at bedside.  CSW signing of.    Employment status:  Retired Nurse, adult PT Recommendations:  Not assessed at this time Information / Referral to community resources:     Patient/Family's Response to care: Patient agreeable to go to SNF.  Patient/Family's Understanding of and Emotional Response to Diagnosis, Current Treatment, and Prognosis:  Patient verbalizes understanding of her diagnosis, treatment and prognosis.    Emotional Assessment Appearance:  Developmentally appropriate Attitude/Demeanor/Rapport:   (Cooperative) Affect (typically observed):  Calm Orientation:  Oriented to Self, Oriented to Place, Oriented to  Time, Oriented to Situation Alcohol / Substance use:  Not Applicable Psych involvement (Current and /or in the community):  No (Comment)  Discharge Needs  Concerns to be addressed:  Discharge Planning Concerns Readmission within the last 30 days:  No Current discharge risk:  None Barriers to Discharge:  No Barriers Identified   Ihor Gully, LCSW 12/23/2014, 12:43 PM [phone]

## 2014-12-23 NOTE — Consult Note (Signed)
Catherine A. Merlene Laughter, MD     [email or web address]          Catherine [name] is an 81 y.o. female.   ASSESSMENT/PLAN: 1. Parkinson disease. 2. Gait disorder due to the above and also likely aging. 3. Suspected the reactions/allergies to multiple medications including multiple parkinsonian medications.  Recommendation: I think we should try the patient on Sinemet. The dose was started at 25/100 increase gradually to 3 times a day dosing. We will give the patient prednisone for the first week to mitigate any reaction that she may have with this medication. Physical therapy and occupational therapy.  The patient presents with the worsening gait impairment, fatigue and lethargy. She has a history of Parkinson disease diagnosed a few years ago. The patient is being followed by Dr. Heide [name]. The patient apparently has had untoward side effects or multiple medications including multiple parkinsonian medications. These medications include Sinemet and Azilect. It appears that the patient may have developed a rash to these medications. She was started on Mirapex which was last seen by Dr. Jannifer [name] but it appears she has not taken this medication. She seemed to have a severe phobia to taking medications because of potential side effects. She's had a significant downturn in her ability to ambulate, weakness and overall function. She does not report other symptoms at this time. She does not complain of chest pain, shortness of breath, GI GU symptoms. Review of systems otherwise negative. She does not complain of focal neurological deficit. The patient tells me that her symptoms initially started on the right side and she continues to have more symptoms on the right.  GENERAL: Mister pleasant thin female in no acute distress.  HEENT: Supple. Atraumatic normocephalic. She does have a classic mask face appearance.  ABDOMEN: soft  EXTREMITIES: No edema   BACK:  Normal.  SKIN: Normal by inspection.    MENTAL STATUS: Alert and oriented. Speech, language and cognition are generally fine. However, she does have psychomotor slowing/bradyphrenia. Judgment and insight normal.   CRANIAL NERVES: Pupils are equal, round and reactive to light and accommodation; extra ocular movements are full, there is no significant nystagmus; visual fields are full; upper and lower facial muscles are normal in strength and symmetric, there is no flattening of the nasolabial folds; tongue is midline; uvula is midline; shoulder elevation is normal.  MOTOR: Normal tone, bulk and strength; no pronator drift.  COORDINATION: The patient has persistent moderate frequency moderate amplitude rest tremor worse on the right side. She has significant cogwheeling and rigidity especially on the right side. There is significant impairment of movements/bradykinesia bilaterally more in the right side.  REFLEXES: Deep tendon reflexes are symmetrical and normal. Babinski reflexes are flexor bilaterally.   SENSATION: Normal to light touch.   Blood pressure 131/95, pulse 59, temperature 97.5 F (36.4 C), temperature source Oral, resp. rate 20, height 5' (1.524 m), weight 55.7 kg (122 lb 12.7 oz), SpO2 99 %.  Past Medical History  Diagnosis Date  . Tremor   . Paralysis agitans 09/20/2012  . Sciatica of left side   . Obese   . Diabetes mellitus without complication   . Dyslipidemia   . Migraine without aura, without mention of intractable migraine without mention of status migrainosus   . Hypertension   . Arthritis   . Cancer     basal cell-forehead    Past Surgical History  Procedure Laterality Date  . Appendectomy    . Basal cell carcinoma resection  Forehead  . Mouth surgery    . Tonsillectomy    . Arthroscopic surgery      Left knee  . Trigger finger surgery      Bilateral thumb  . Cataract extraction w/phaco Right 11/19/2013    Procedure: CATARACT EXTRACTION PHACO AND  INTRAOCULAR LENS PLACEMENT (IOC);  Surgeon: Catherine Branch, MD;  Location: [facility];  Service: Ophthalmology;  Laterality: Right;  CDE:  12.95  . Cataract extraction w/phaco Left 12/17/2013    Procedure: CATARACT EXTRACTION PHACO AND INTRAOCULAR LENS PLACEMENT (IOC);  Surgeon: Catherine Branch, MD;  Location: [facility];  Service: Ophthalmology;  Laterality: Left;  CDE 12.10    Family History  Problem Relation Age of Onset  . Heart attack Father     not certain of COD  . Rheum arthritis Mother   . Hypertension Paternal Grandfather   . Stroke Paternal Grandfather   . Cirrhosis Paternal Grandmother     Social History:  reports that she has never smoked. She has never used smokeless tobacco. She reports that she does not drink alcohol or use illicit drugs.  Allergies:  Allergies  Allergen Reactions  . Sulfa Antibiotics Rash  . Azilect [Rasagiline] Rash  . Fenofibrate Rash  . Adhesive [Tape] Rash and Other (See Comments)    Redness  . Sinemet [Carbidopa-Levodopa] Rash  . Tomato Rash    Medications: Prior to Admission medications   Medication Sig Start Date End Date Taking? Authorizing Provider  aspirin 81 MG chewable tablet Chew 81 mg by mouth daily.   Yes Historical Provider, MD  Cyanocobalamin (VITAMIN B 12 PO) Take 1 tablet by mouth daily.   Yes Historical Provider, MD  naproxen sodium (ALEVE) 220 MG tablet Take 220 mg by mouth 2 (two) times daily as needed (Pain).   Yes Historical Provider, MD  ramipril (ALTACE) 2.5 MG capsule Take 2.5 mg by mouth. 02/14/14 02/14/15 Yes Historical Provider, MD  diclofenac sodium (VOLTAREN) 1 % GEL Place onto the skin. 05/17/14   Historical Provider, MD    Scheduled Meds: . heparin  5,000 Units Subcutaneous 3 times per day  . insulin aspart  0-15 Units Subcutaneous TID WC  . insulin aspart  0-5 Units Subcutaneous QHS  . tuberculin  5 Units Intradermal Once   Continuous Infusions:  PRN Meds:.acetaminophen **OR** acetaminophen, morphine injection, ondansetron  **OR** ondansetron (ZOFRAN) IV     Results for orders placed or performed during the hospital encounter of 12/21/14 (from the past 48 hour(s))  Basic metabolic panel     Status: None   Collection Time: 12/21/14 10:22 PM  Result Value Ref Range   Sodium 141 135 - 145 mmol/L   Potassium 3.5 3.5 - 5.1 mmol/L   Chloride 110 101 - 111 mmol/L   CO2 25 22 - 32 mmol/L   Glucose, Bld 93 65 - 99 mg/dL   BUN 14 6 - 20 mg/dL   Creatinine, Ser 0.66 0.44 - 1.00 mg/dL   Calcium 8.9 8.9 - 10.3 mg/dL   GFR calc non Af Amer >60 >60 mL/min   GFR calc Af Amer >60 >60 mL/min    Comment: (NOTE) The eGFR has been calculated using the CKD EPI equation. This calculation has not been validated in all clinical situations. eGFR's persistently <60 mL/min signify possible Chronic Kidney Disease.    Anion gap 6 5 - 15  CBC with Differential/Platelet     Status: None   Collection Time: 12/21/14 10:22 PM  Result Value Ref Range  WBC 5.8 4.0 - 10.5 K/uL   RBC 4.15 3.87 - 5.11 MIL/uL   Hemoglobin 13.0 12.0 - 15.0 g/dL   HCT 39.0 36.0 - 46.0 %   MCV 94.0 78.0 - 100.0 fL   MCH 31.3 26.0 - 34.0 pg   MCHC 33.3 30.0 - 36.0 g/dL   RDW 13.2 11.5 - 15.5 %   Platelets 161 150 - 400 K/uL   Neutrophils Relative % 44 43 - 77 %   Neutro Abs 2.6 1.7 - 7.7 K/uL   Lymphocytes Relative 39 12 - 46 %   Lymphs Abs 2.3 0.7 - 4.0 K/uL   Monocytes Relative 11 3 - 12 %   Monocytes Absolute 0.7 0.1 - 1.0 K/uL   Eosinophils Relative 5 0 - 5 %   Eosinophils Absolute 0.3 0.0 - 0.7 K/uL   Basophils Relative 1 0 - 1 %   Basophils Absolute 0.0 0.0 - 0.1 K/uL  Hemoglobin A1c     Status: Abnormal   Collection Time: 12/21/14 10:49 PM  Result Value Ref Range   Hgb A1c MFr Bld 6.3 (H) 4.8 - 5.6 %    Comment: (NOTE)         Pre-diabetes: 5.7 - 6.4         Diabetes: >6.4         Glycemic control for adults with diabetes: <7.0    Mean Plasma Glucose 134 mg/dL    Comment: (NOTE) Performed At: [facility] [place], [place] [id number] Lindon Romp MD KP:[id number]   Urinalysis, Routine w reflex microscopic (not at [facility])     Status: None   Collection Time: 12/21/14 11:15 PM  Result Value Ref Range   Color, Urine YELLOW YELLOW   APPearance CLEAR CLEAR   Specific Gravity, Urine 1.025 1.005 - 1.030   pH 6.0 5.0 - 8.0   Glucose, UA NEGATIVE NEGATIVE mg/dL   Hgb urine dipstick NEGATIVE NEGATIVE   Bilirubin Urine NEGATIVE NEGATIVE   Ketones, ur NEGATIVE NEGATIVE mg/dL   Protein, ur NEGATIVE NEGATIVE mg/dL   Urobilinogen, UA 0.2 0.0 - 1.0 mg/dL   Nitrite NEGATIVE NEGATIVE   Leukocytes, UA NEGATIVE NEGATIVE    Comment: MICROSCOPIC NOT DONE ON URINES WITH NEGATIVE PROTEIN, BLOOD, LEUKOCYTES, NITRITE, OR GLUCOSE <1000 mg/dL.  Glucose, capillary     Status: None   Collection Time: 12/22/14  2:22 AM  Result Value Ref Range   Glucose-Capillary 95 65 - 99 mg/dL  Comprehensive metabolic panel     Status: Abnormal   Collection Time: 12/22/14  5:43 AM  Result Value Ref Range   Sodium 140 135 - 145 mmol/L   Potassium 3.4 (L) 3.5 - 5.1 mmol/L   Chloride 107 101 - 111 mmol/L   CO2 26 22 - 32 mmol/L   Glucose, Bld 91 65 - 99 mg/dL   BUN 12 6 - 20 mg/dL   Creatinine, Ser 0.59 0.44 - 1.00 mg/dL   Calcium 8.8 (L) 8.9 - 10.3 mg/dL   Total Protein 5.8 (L) 6.5 - 8.1 g/dL   Albumin 3.6 3.5 - 5.0 g/dL   AST 14 (L) 15 - 41 U/L   ALT 10 (L) 14 - 54 U/L   Alkaline Phosphatase 32 (L) 38 - 126 U/L   Total Bilirubin 0.9 0.3 - 1.2 mg/dL   GFR calc non Af Amer >60 >60 mL/min   GFR calc Af Amer >60 >60 mL/min    Comment: (NOTE) The eGFR has been calculated using  the CKD EPI equation. This calculation has not been validated in all clinical situations. eGFR's persistently <60 mL/min signify possible Chronic Kidney Disease.    Anion gap 7 5 - 15  CBC     Status: None   Collection Time: 12/22/14  5:43 AM  Result Value Ref Range   WBC 5.2 4.0 - 10.5 K/uL   RBC 4.20 3.87 - 5.11 MIL/uL   Hemoglobin 13.4 12.0  - 15.0 g/dL   HCT 39.5 36.0 - 46.0 %   MCV 94.0 78.0 - 100.0 fL   MCH 31.9 26.0 - 34.0 pg   MCHC 33.9 30.0 - 36.0 g/dL   RDW 13.2 11.5 - 15.5 %   Platelets 164 150 - 400 K/uL  Lipid panel     Status: Abnormal   Collection Time: 12/22/14  5:43 AM  Result Value Ref Range   Cholesterol 161 0 - 200 mg/dL   Triglycerides 73 <150 mg/dL   HDL 36 (L) >40 mg/dL   Total CHOL/HDL Ratio 4.5 RATIO   VLDL 15 0 - 40 mg/dL   LDL Cholesterol 110 (H) 0 - 99 mg/dL    Comment:        Total Cholesterol/HDL:CHD Risk Coronary Heart Disease Risk Table                     Men   Women  1/2 Average Risk   3.4   3.3  Average Risk       5.0   4.4  2 X Average Risk   9.6   7.1  3 X Average Risk  23.4   11.0        Use the calculated Patient Ratio above and the CHD Risk Table to determine the patient's CHD Risk.        ATP III CLASSIFICATION (LDL):  <100     mg/dL   Optimal  100-129  mg/dL   Near or Above                    Optimal  130-159  mg/dL   Borderline  160-189  mg/dL   High  >190     mg/dL   Very High   TSH     Status: None   Collection Time: 12/22/14  5:43 AM  Result Value Ref Range   TSH 3.172 0.350 - 4.500 uIU/mL  Vitamin B12     Status: None   Collection Time: 12/22/14  5:43 AM  Result Value Ref Range   Vitamin B-12 233 180 - 914 pg/mL    Comment: (NOTE) This assay is not validated for testing neonatal or myeloproliferative syndrome specimens for Vitamin B12 levels. Performed at [facility]   Glucose, capillary     Status: Abnormal   Collection Time: 12/22/14  7:08 AM  Result Value Ref Range   Glucose-Capillary 105 (H) 65 - 99 mg/dL   Comment 1 Notify RN    Comment 2 Document in Chart   Glucose, capillary     Status: None   Collection Time: 12/22/14 11:42 AM  Result Value Ref Range   Glucose-Capillary 97 65 - 99 mg/dL   Comment 1 Notify RN    Comment 2 Document in Chart   Glucose, capillary     Status: None   Collection Time: 12/22/14  4:28 PM  Result Value Ref  Range   Glucose-Capillary 81 65 - 99 mg/dL   Comment 1 Notify RN    Comment 2  Document in Chart   Glucose, capillary     Status: None   Collection Time: 12/22/14 11:18 PM  Result Value Ref Range   Glucose-Capillary 84 65 - 99 mg/dL  Glucose, capillary     Status: None   Collection Time: 12/23/14  7:32 AM  Result Value Ref Range   Glucose-Capillary 91 65 - 99 mg/dL  Glucose, capillary     Status: Abnormal   Collection Time: 12/23/14 11:43 AM  Result Value Ref Range   Glucose-Capillary 106 (H) 65 - 99 mg/dL    Studies/Results:     Luceil Herrin A. Merlene [name], M.D.  Diplomate, Tax adviser of Psychiatry and Neurology ( Neurology). 12/23/2014, 2:00 PM

## 2014-12-23 NOTE — Evaluation (Signed)
Physical Therapy Evaluation Patient Details Name: Catherine [name] MRN: [id number] DOB: 01/16/1928 Today's Date: 12/23/2014   History of Present Illness  Pt is an 81 year old female admitted with the inability to move her LEs and ambulate.  She lives alone and has all the signs of moderate to severe Parkinson's.  Pt states that she has tried Parkinson's meds in the past but has had a reaction to them.  Apparently she was recently prescribed another medication but she did not take it because the precautions stated that it might cause drowsiness.  She was afraid to take it as she lives alone.  She states that her Parkinson's symptoms have been progressively worsening.  Clinical Impression  Pt was seen for evaluation and found to be alert, oriented and very cooperative.  She is found to have significant muscular rigidity throughout with limited functional mobility.  She had been planning to enter ACLF on 01-26-15 due to difficulty with living alone.  Currently, her mobility is so limited that she is unable to function without mod to max assist.  I am strongly recommending SNF at d/c.  She would also benefit from following Neurology advice to try some other meds to help control her Parkinson's symptoms.    Follow Up Recommendations SNF    Equipment Recommendations  None recommended by PT    Recommendations for Other Services   OT    Precautions / Restrictions Precautions Precautions: Fall Restrictions Weight Bearing Restrictions: No      Mobility  Bed Mobility Overal bed mobility: Needs Assistance Bed Mobility: Supine to Sit     Supine to sit: Mod assist;HOB elevated     General bed mobility comments: pt unable to scoot hips on the bed  Transfers Overall transfer level: Needs assistance Equipment used: Rolling walker (2 wheeled) Transfers: Sit to/from Stand Sit to Stand: Max assist         General transfer comment: has significant difficulty transferring weight  anteriorly  Ambulation/Gait Ambulation/Gait assistance: Mod assist Ambulation Distance (Feet): 2 Feet Assistive device: Rolling walker (2 wheeled) Gait Pattern/deviations: Narrow base of support;Decreased stride length;Decreased dorsiflexion - right;Decreased dorsiflexion - left   Gait velocity interpretation: <1.8 ft/sec, indicative of risk for recurrent falls General Gait Details: pt unable to functionally ambulate due to severe rigidity                       Balance Overall balance assessment: Needs assistance Sitting-balance support: Bilateral upper extremity supported;Feet supported Sitting balance-Leahy Scale: Fair Sitting balance - Comments: tends to lean to the right and unable to shift weight laterally                                     Pertinent Vitals/Pain Pain Assessment: No/denies pain    Home Living Family/patient expects to be discharged to:: Skilled nursing facility Living Arrangements: Alone                    Prior Function Level of Independence: Independent with assistive device(s)         Comments: ambulated with a walker, slept in a lift chair     Hand Dominance   Dominant Hand: Right    Extremity/Trunk Assessment               Lower Extremity Assessment: Generalized weakness (generalized rigidity throughout.)      Cervical / Trunk  Assessment:  (limited trunk and cervical rotation)  Communication   Communication: Expressive difficulties (speech is very slow)  Cognition Arousal/Alertness: Awake/alert Behavior During Therapy: WFL for tasks assessed/performed Overall Cognitive Status: Within Functional Limits for tasks assessed                                    Assessment/Plan    PT Assessment All further PT needs can be met in the next venue of care (pt is supposed to be discharged today)  PT Diagnosis Difficulty walking;Abnormality of gait;Generalized weakness   PT Problem List  Decreased strength;Decreased range of motion;Decreased balance;Decreased mobility;Impaired tone  PT Treatment Interventions     PT Goals (Current goals can be found in the Care Plan section) Acute Rehab PT Goals PT Goal Formulation: All assessment and education complete, DC therapy         Barriers to discharge  N/A                     End of Session Equipment Utilized During Treatment: Gait belt Activity Tolerance: Patient tolerated treatment well Patient left: in chair;with call bell/phone within reach Nurse Communication: Mobility status    Functional Assessment Tool Used: clinical judgement Functional Limitation: Mobility: Walking and moving around Mobility: Walking and Moving Around Current Status (G2836): At least 40 percent but less than 60 percent impaired, limited or restricted Mobility: Walking and Moving Around Goal Status [phone]): At least 40 percent but less than 60 percent impaired, limited or restricted Mobility: Walking and Moving Around Discharge Status [phone]): At least 40 percent but less than 60 percent impaired, limited or restricted    Time: 0905-0929 PT Time Calculation (min) (ACUTE ONLY): 24 min   Charges:   PT Evaluation $Initial PT Evaluation Tier I: 1 Procedure     PT G Codes:   PT G-Codes **NOT FOR INPATIENT CLASS** Functional Assessment Tool Used: clinical judgement Functional Limitation: Mobility: Walking and moving around Mobility: Walking and Moving Around Current Status (K3546): At least 40 percent but less than 60 percent impaired, limited or restricted Mobility: Walking and Moving Around Goal Status [phone]): At least 40 percent but less than 60 percent impaired, limited or restricted Mobility: Walking and Moving Around Discharge Status [phone]): At least 40 percent but less than 60 percent impaired, limited or restricted    Sable Feil  PT 12/23/2014, 9:42 AM [phone]

## 2014-12-23 NOTE — Evaluation (Signed)
Occupational Therapy Evaluation Patient Details Name: Catherine [name] MRN: [id number] DOB: [birth year] Today's Date: 12/23/2014    History of Present Illness Pt is an 81 year old female admitted with the inability to move her LEs and ambulate.  She lives alone and has all the signs of moderate to severe Parkinson's.  Pt states that she has tried Parkinson's meds in the past but has had a reaction to them.  Apparently she was recently prescribed another medication but she did not take it because the precautions stated that it might cause drowsiness.  She was afraid to take it as she lives alone.  She states that her Parkinson's symptoms have been progressively worsening.   Clinical Impression   Pt will benefit from skilled OT services at SNF to increase strength and endurance to increase functional performance during ADL tasks and functional transfers to transition to assisted living facility. She had been planning to enter ACLF on 01-26-15 due to difficulty with living alone. Currently, her mobility is so limited that she is unable to function without mod to max assist. I am strongly recommending SNF at d/c.    Follow Up Recommendations  SNF    Equipment Recommendations  None recommended by OT       Precautions / Restrictions Precautions Precautions: Fall Restrictions Weight Bearing Restrictions: No      Mobility Bed Mobility Overal bed mobility: Needs Assistance Bed Mobility: Supine to Sit     Supine to sit: Mod assist;HOB elevated     General bed mobility comments: pt unable to scoot hips on the bed  Transfers Overall transfer level: Needs assistance Equipment used: Rolling walker (2 wheeled) Transfers: Sit to/from Stand Sit to Stand: Max assist         General transfer comment: has significant difficulty transferring weight anteriorly         ADL Overall ADL's : Needs assistance/impaired                     Lower Body Dressing: Total assistance;Bed  level   Toilet Transfer: Maximal assistance;RW;Stand-pivot                             Pertinent Vitals/Pain Pain Assessment: No/denies pain     Hand Dominance Right   Extremity/Trunk Assessment Upper Extremity Assessment Upper Extremity Assessment: Generalized weakness. Right UE tremor which has been long standing.  Limited AROM of RUE. Approx. 50% range.    Lower Extremity Assessment Lower Extremity Assessment: Defer to PT evaluation   Cervical / Trunk Assessment Cervical / Trunk Assessment:  (limited trunk and cervical rotation)   Communication Communication Communication: Expressive difficulties   Cognition Arousal/Alertness: Awake/alert Behavior During Therapy: WFL for tasks assessed/performed Overall Cognitive Status: Within Functional Limits for tasks assessed                                Home Living Family/patient expects to be discharged to:: Skilled nursing facility Living Arrangements: Alone                                      Prior Functioning/Environment Level of Independence: Independent with assistive device(s)        Comments: ambulated with a walker, slept in a lift chair    OT Diagnosis: Generalized weakness  OT Goals(Current goals can be found in the care plan section) Acute Rehab OT Goals Patient Stated Goal: to increase strength and return to PLOF.  OT Frequency:             Co-evaluation PT/OT/SLP Co-Evaluation/Treatment: Yes Reason for Co-Treatment: For patient/therapist safety PT goals addressed during session: Mobility/safety with mobility;Proper use of DME OT goals addressed during session: ADL's and self-care;Proper use of Adaptive equipment and DME      End of Session Equipment Utilized During Treatment: Gait belt;Rolling walker  Activity Tolerance: Patient tolerated treatment well Patient left: in chair;with call bell/phone within reach   Time: 0845-0920 OT Time Calculation  (min): 35 min Charges:  OT General Charges $OT Visit: 1 Procedure OT Evaluation $Initial OT Evaluation Tier I: 1 Procedure G-Codes: OT G-codes **NOT FOR INPATIENT CLASS** Functional Assessment Tool Used: Clnical judgement Functional Limitation: Self care Self Care Current Status (M7867): At least 60 percent but less than 80 percent impaired, limited or restricted Self Care Goal Status (J4492): At least 60 percent but less than 80 percent impaired, limited or restricted Self Care Discharge Status [phone]): At least 60 percent but less than 80 percent impaired, limited or restricted  .Ailene Ravel, OTR/L,CBIS  [phone]  12/23/2014, 10:29 AM

## 2014-12-25 ENCOUNTER — Non-Acute Institutional Stay (SKILLED_NURSING_FACILITY): Payer: Medicare Other | Admitting: Internal Medicine

## 2014-12-25 DIAGNOSIS — I10 Essential (primary) hypertension: Secondary | ICD-10-CM

## 2014-12-25 DIAGNOSIS — E119 Type 2 diabetes mellitus without complications: Secondary | ICD-10-CM | POA: Diagnosis not present

## 2014-12-25 DIAGNOSIS — G20 Parkinson's disease: Secondary | ICD-10-CM

## 2014-12-25 DIAGNOSIS — I951 Orthostatic hypotension: Secondary | ICD-10-CM | POA: Diagnosis not present

## 2014-12-26 ENCOUNTER — Encounter (HOSPITAL_COMMUNITY)
Admission: AD | Admit: 2014-12-26 | Discharge: 2014-12-26 | Disposition: A | Discharge status: Home / Self Care | Payer: Medicare Other | Source: Skilled Nursing Facility | Attending: Internal Medicine | Admitting: Internal Medicine

## 2014-12-26 LAB — BASIC METABOLIC PANEL
Anion gap: 6 (ref 5–15)
BUN: 17 mg/dL (ref 6–20)
CO2: 30 mmol/L (ref 22–32)
CREATININE: 0.71 mg/dL (ref 0.44–1.00)
Calcium: 9.1 mg/dL (ref 8.9–10.3)
Chloride: 105 mmol/L (ref 101–111)
GFR calc Af Amer: >60 mL/min (ref >60)
Glucose, Bld: 103 mg/dL — ABNORMAL HIGH (ref 65–99)
POTASSIUM: 3.9 mmol/L (ref 3.5–5.1)
SODIUM: 141 mmol/L (ref 135–145)

## 2014-12-30 NOTE — Progress Notes (Signed)
Patient ID: Catherine [name], female   DOB: [birth year], 81 y.o.   MRN: [id number]                HISTORY & PHYSICAL  DATE:  12/25/2014          FACILITY: [facility]                      LEVEL OF CARE:   SNF   CHIEF COMPLAINT:  Admission to SNF, post stay at [facility], 12/21/2014 through 12/23/2014.    HISTORY OF PRESENT ILLNESS:  This is an 81 year-old woman who apparently came to the hospital when she got out of her lift chair at home and then was unable to walk.  CT scan of the head in the emergency room was negative.    The history here is a bit long and convoluted, now complicated by the fact that [facility] is down and I really cannot research this.    In any case, the patient states she has had a tremor of her right hand for six years.  It was first noted when she was trying to lift her husband up, who had died.  She tells me that she saw a neurologist in [facility] four years ago.  Was given medication, 2 at lunch and 2 in the evening, which caused a widespread rash.  She then saw Dr. Jannifer Franklin in [place] on 09/22/2014, where it was recommended that she take Mirapex.  However, she did not fill this prescription because one of the side effects was "drowsiness".  Therefore, essentially she has been not treated at all.     She tells me that she walks with a rolling walker at home.  She has had three falls over the course of this year.  She is not able to get herself up after this and either has to call neighbors or use [employer].    She apparently saw Neurology (Dr. Leonel Ramsay) in the hospital, but it was not recommended that she come out on any treatment for her Parkinson's.    I note that she has an allergy to Sinemet, causing a rash.  I am, therefore, thinking that the medication the neurologist tried in [facility] four years ago was indeed Sinemet.  It is also probably the reason that Dr. Jannifer Franklin suggested Mirapex.  Otherwise, for this soon-to-be 81 year-old woman,  Sinemet would have been clearly the drug of choice.    The patient is here for rehabilitation. There are other issues that I have listed below (see Review of Systems).    PAST MEDICAL HISTORY/PROBLEM LIST:            Weakness.    Type 2 diabetes.   The patient tells me she was on treatment with pills up to a year ago, but was taken off when her blood sugar normalized.   She is on no current treatment.  I do not see a hemoglobin A1c.  However, her blood sugars seem to range from 81 to 105.    Hyperlipidemia.  Her LDL is listed at 110.  She was not on a statin prior to admission.    PAT (paroxysmal atrial tachycardia).     Gait ataxia.    Hypertension.    Migraines without aura.    Arthritis.    Basal cell skin cancer on the forehead.    PAST SURGICAL HISTORY:    Appendectomy.    Basal cell carcinoma resection.  Mouth surgery.    Tonsillectomy.    Arthroscopic surgery on her left knee.    Trigger finger surgery.    Cataract extraction done in July 2015, although the patient states this has not helped.    CURRENT MEDICATIONS:  Medication list is reviewed.    Aleve 220 b.i.d. p.r.n. for pain.    ASA 81 q.d.      Diclofenac 1%.  Not exactly sure where they are applying this.      Ramipril 2.5 q.d.      Vitamin B12, 1 tablet daily, 1000 mg.    SOCIAL HISTORY:                   HOUSING:  This patient lives in her own home in [place].     FUNCTIONAL STATUS:  She states she walks with a rolling walker.  She is able to do her own ADLs, but relies on neighbors for things like grocery shopping, etc.   MARITAL STATUS:  Her husband died six years ago.   CHILDREN:  She has no children.  She does have a nephew.   TOBACCO USE:  Non-smoker.   ALCOHOL:  Non-drinker.      FAMILY HISTORY:       FATHER:  Heart attack.   MOTHER:  Rheumatoid arthritis.   SIBLINGS:  Her only brother committed suicide 30 years ago.    REVIEW OF SYSTEMS:            GENERAL:  The patient  denies weight loss.  She states she is becoming increasingly weak and has difficulty getting around.   HEENT:  Had cataract surgery last year.  She does not feel this has helped her vision, although she can still read with a magnifying glass.   CHEST/RESPIRATORY:  No shortness of breath.     CARDIAC:  No chest pain.   No palpitations.   GI:  No nausea.  No vomiting.  No abdominal pain.  No change in bowel habits.  No dysphagia.   GU:  Negative for urinary incontinence, urgency, or hematuria.   MUSCULOSKELETAL:  Negative for back pain or joint pain.   SKIN:  No current rashes are noted.    NEUROLOGICAL:  Positive for tremors, weakness, and difficulty walking.   PSYCHIATRIC:  She tells me that several times over the last month, she has had episodes where she feels that somebody would be breaking into her house; for example, people pounding on the door or pulling on her screens.  She has called the police or used [employer] on perhaps 2-3 occasions and nobody was found.    PHYSICAL EXAMINATION:   GENERAL APPEARANCE:  The patient is not in any distress.  Very pleasant.     HEENT:   MOUTH/THROAT:    Oral exam is normal.   EYES:  Her eyes appear normal.   NECK/THYROID:   No thyroid is palpable.   LYMPHATICS:  None palpable in the cervical, clavicular, or axillary areas.   CHEST/RESPIRATORY:  Clear air entry bilaterally.     CARDIOVASCULAR:   CARDIAC:  Heart sounds are normal.  There are no murmurs.  No gallops.  No carotid bruits.   GASTROINTESTINAL:   ABDOMEN:  No masses.    LIVER/SPLEEN/KIDNEY:   No liver, no spleen.  No tenderness.    GENITOURINARY:   BLADDER:  Not distended.  There is no CVA tenderness.   CIRCULATION:   EDEMA/VARICOSITIES:  Extremities:  Some degree of venous stasis.  VASCULAR:   ARTERIAL:  Peripheral pulses are palpable in her feet in the posterior tibial and dorsalis pedis.     NEUROLOGICAL:   CRANIAL NERVES:  Her extraocular movements are normal.  Cranial nerve #s 7,  5, 12 appear normal.   MOTOR:  She has cogwheel rigidity and bradykinesia which is moderate.  She has a resting tremor of her right hand which ameliorates with activity.   SENSATION/STRENGTH:   DEEP TENDON REFLEXES:  Reflexes are symmetrically present.  Babinski is flexor.  There is no Hoffman's reflex.   BALANCE/GAIT:   The patient is not able to get herself out of bed.  She requires moderate assistance of one.  She has a narrow-based, short stride.  Gait compatible with a Parkinson's type gait.  She is very immobile.  I did not have a walker to see her do this.   PSYCHIATRIC:   MENTAL STATUS:   I did not detect any issues here.  No depression, no delirium, and no overt cognitive impairment.     ASSESSMENT/PLAN:                  Parkinson's disease.  This is fairly obvious.  I am assuming that she was intolerant to Sinemet four years ago with a rash.  She did not use the Mirapex that Dr. Jannifer Franklin prescribed.  She certainly needs a dopaminergic agonist if the allergy to Sinemet is true.  Unfortunately, I cannot get on EPIC at the moment in order to verify any of this.  I think Mirapex would be a reasonable choice.  There is no doubt in my mind that she requires treatment.    There are some worrisome features to her exam, including fairly significant nuchal rigidity.  Also, the suggestion that she has possibly some delusional thought exists.  Nevertheless, I think this should not be concerning enough at this point to start her on treatment.    Hypertension.  She is on Altace.  She gives some suggestion of orthostatic hypotension which, of course, can go along with Parkinson's disease or some of its cousins, or be worsened by any of the dopaminergic agonists.  I would actually like to measure this myself.    Orthostatic hypotension.  See discussion above.    History of diabetes.  She has not been on any treatment for over a year, according to the patient.    Hypokalemia.  She left the hospital with a  potassium of 3.4.  This will need to be rechecked.  The etiology of this is not really clear.    Social problems.  The patient is planning to go to an assisted living/[facility].  I will discuss this with the Social Services.  As she has had problems related to the use of dopaminergic agonists, or Sinemet, I would like to keep her here for a bit to see if she could tolerate these medications.  The possibility of delusional thought here also exists which, of course, can be made worse by dopaminergic agonists.

## 2015-01-03 ENCOUNTER — Encounter (HOSPITAL_COMMUNITY)
Admission: AD | Admit: 2015-01-03 | Discharge: 2015-01-03 | Disposition: A | Discharge status: Home / Self Care | Payer: Medicare Other | Source: Skilled Nursing Facility | Attending: Internal Medicine | Admitting: Internal Medicine

## 2015-01-03 LAB — BASIC METABOLIC PANEL
ANION GAP: 5 (ref 5–15)
BUN: 14 mg/dL (ref 6–20)
CALCIUM: 8.6 mg/dL — AB (ref 8.9–10.3)
CO2: 31 mmol/L (ref 22–32)
Chloride: 106 mmol/L (ref 101–111)
Creatinine, Ser: 0.66 mg/dL (ref 0.44–1.00)
GLUCOSE: 115 mg/dL — AB (ref 65–99)
POTASSIUM: 4 mmol/L (ref 3.5–5.1)
Sodium: 142 mmol/L (ref 135–145)

## 2015-01-06 ENCOUNTER — Non-Acute Institutional Stay (SKILLED_NURSING_FACILITY): Payer: Medicare Other | Admitting: Internal Medicine

## 2015-01-06 DIAGNOSIS — E119 Type 2 diabetes mellitus without complications: Secondary | ICD-10-CM | POA: Diagnosis not present

## 2015-01-06 DIAGNOSIS — G20 Parkinson's disease: Secondary | ICD-10-CM

## 2015-01-06 NOTE — Progress Notes (Signed)
Patient ID: Catherine [name], female   DOB: 07-Aug-1927, 81 y.o.   MRN: [id number]

## 2015-01-10 NOTE — Progress Notes (Addendum)
Patient ID: Catherine [name], female   DOB: 1928-01-12, 81 y.o.   MRN: [id number]                PROGRESS NOTE  DATE:  01/06/2015            FACILITY: [facility]             LEVEL OF CARE:   SNF   Acute Visit              CHIEF COMPLAINT:   Follow up Parkinson's disease.      HISTORY OF PRESENT ILLNESS:  This /is a patient who came to the building when she got up out of her chair at home and could not walk.  A CT scan of the head was negative.  She was seen by Neurology in the hospital, but was not started on medications for Parkinson's disease.    With some difficulty, I was able to put together the history.  She was apparently prescribed Sinemet by a neurologist in [facility] some years ago, but developed a widespread rash.  She then saw Dr. Jannifer [name] in May 2016, where it was recommended that she take Mirapex.  However, she did not fill this prescription because of the side effects.  I started her on Mirapex at 0.125 t.i.d. two weeks ago.  I have been taking it easy on this because of some degree of orthostatic intolerance.  The patient states she is tolerating it well.  She feels her mobility is improving, although I have not verified this with Physical Therapy.    LABORATORY DATA:   Lab work shows:    Sodium 142, BUN 14, creatinine 0.66.    Her blood sugars, which I am assuming are fasting, run between 103-115.  The patient has a history of type 2 diabetes, but has not been on treatment in over a year.  Hemoglobin A1c on 12/21/2014, however, was 6.3.    SOCIAL HISTORY:   HOUSING:  The patient lived at home prior to this.  It had already been decided before she came into the facility that she would go to [facility] and is apparently due to leave her on Thursday morning to go to [facility].    REVIEW OF SYSTEMS:    CHEST/RESPIRATORY:  No shortness of breath.  No cough.   CARDIAC:  No chest pain.   GI:  No abdominal pain.   No change in bowel habits.   GU:  No  dysuria.    NEUROLOGICAL:  The patient thinks her walking is improving.    PHYSICAL EXAMINATION:   GENERAL APPEARANCE:  The patient is not in any distress.        CHEST/RESPIRATORY:  Clear air entry bilaterally.    CARDIOVASCULAR:   CARDIAC:  Heart sounds are normal.  There are no murmurs.    GASTROINTESTINAL:   LIVER/SPLEEN/KIDNEYS:  No liver, no spleen.  No tenderness.     NEUROLOGICAL:   There is much less cogwheeling than when I started her on the small dose of Mirapex.  She still has a resting tremor.  Her speech is easier to understand.      ASSESSMENT/PLAN:                     Parkinson's disease.  I think this is already showing some improvement.  I am going to increase her Mirapex to 0.25 t.i.d.   There would be room to increase this even  further based on clinical response.    Type 2 diabetes.  Not on any current treatment.  Her hemoglobin A1c is 6.3.  She does not require any treatment.

## 2015-02-20 ENCOUNTER — Ambulatory Visit: Payer: Medicare Other | Admitting: Podiatry

## 2015-03-31 ENCOUNTER — Other Ambulatory Visit: Payer: Self-pay | Admitting: Internal Medicine

## 2015-04-01 ENCOUNTER — Encounter (HOSPITAL_COMMUNITY): Payer: Self-pay | Admitting: Emergency Medicine

## 2015-04-01 ENCOUNTER — Emergency Department (HOSPITAL_COMMUNITY)
Admission: EM | Admit: 2015-04-01 | Discharge: 2015-04-01 | Disposition: A | Discharge status: Home / Self Care | Payer: Medicare Other | Attending: Emergency Medicine | Admitting: Emergency Medicine

## 2015-04-01 ENCOUNTER — Emergency Department (HOSPITAL_COMMUNITY): Payer: Medicare Other

## 2015-04-01 DIAGNOSIS — S8992XA Unspecified injury of left lower leg, initial encounter: Secondary | ICD-10-CM | POA: Diagnosis not present

## 2015-04-01 DIAGNOSIS — E669 Obesity, unspecified: Secondary | ICD-10-CM | POA: Diagnosis not present

## 2015-04-01 DIAGNOSIS — G20 Parkinson's disease: Secondary | ICD-10-CM | POA: Diagnosis not present

## 2015-04-01 DIAGNOSIS — E119 Type 2 diabetes mellitus without complications: Secondary | ICD-10-CM | POA: Diagnosis not present

## 2015-04-01 DIAGNOSIS — S0990XA Unspecified injury of head, initial encounter: Secondary | ICD-10-CM | POA: Insufficient documentation

## 2015-04-01 DIAGNOSIS — Z79899 Other long term (current) drug therapy: Secondary | ICD-10-CM | POA: Diagnosis not present

## 2015-04-01 DIAGNOSIS — W01198A Fall on same level from slipping, tripping and stumbling with subsequent striking against other object, initial encounter: Secondary | ICD-10-CM | POA: Diagnosis not present

## 2015-04-01 DIAGNOSIS — W19XXXA Unspecified fall, initial encounter: Secondary | ICD-10-CM

## 2015-04-01 DIAGNOSIS — I10 Essential (primary) hypertension: Secondary | ICD-10-CM | POA: Insufficient documentation

## 2015-04-01 DIAGNOSIS — Y9389 Activity, other specified: Secondary | ICD-10-CM | POA: Diagnosis not present

## 2015-04-01 DIAGNOSIS — Y998 Other external cause status: Secondary | ICD-10-CM | POA: Diagnosis not present

## 2015-04-01 DIAGNOSIS — S4991XA Unspecified injury of right shoulder and upper arm, initial encounter: Secondary | ICD-10-CM | POA: Diagnosis not present

## 2015-04-01 DIAGNOSIS — S99912A Unspecified injury of left ankle, initial encounter: Secondary | ICD-10-CM | POA: Diagnosis not present

## 2015-04-01 DIAGNOSIS — M1990 Unspecified osteoarthritis, unspecified site: Secondary | ICD-10-CM | POA: Insufficient documentation

## 2015-04-01 DIAGNOSIS — Y9289 Other specified places as the place of occurrence of the external cause: Secondary | ICD-10-CM | POA: Insufficient documentation

## 2015-04-01 DIAGNOSIS — Z7982 Long term (current) use of aspirin: Secondary | ICD-10-CM | POA: Insufficient documentation

## 2015-04-01 LAB — CBC WITH DIFFERENTIAL/PLATELET
Basophils Absolute: 0 10*3/uL (ref 0.0–0.1)
Basophils Relative: 0 %
Eosinophils Absolute: 0.2 10*3/uL (ref 0.0–0.7)
Eosinophils Relative: 2 %
HEMATOCRIT: 38.6 % (ref 36.0–46.0)
HEMOGLOBIN: 13.3 g/dL (ref 12.0–15.0)
LYMPHS ABS: 1.5 10*3/uL (ref 0.7–4.0)
LYMPHS PCT: 19 %
MCH: 32.1 pg (ref 26.0–34.0)
MCHC: 34.5 g/dL (ref 30.0–36.0)
MCV: 93.2 fL (ref 78.0–100.0)
Monocytes Absolute: 0.6 10*3/uL (ref 0.1–1.0)
Monocytes Relative: 8 %
NEUTROS PCT: 71 %
Neutro Abs: 5.4 10*3/uL (ref 1.7–7.7)
Platelets: 168 10*3/uL (ref 150–400)
RBC: 4.14 MIL/uL (ref 3.87–5.11)
RDW: 12.6 % (ref 11.5–15.5)
WBC: 7.7 10*3/uL (ref 4.0–10.5)

## 2015-04-01 LAB — BASIC METABOLIC PANEL
Anion gap: 6 (ref 5–15)
BUN: 13 mg/dL (ref 6–20)
CHLORIDE: 106 mmol/L (ref 101–111)
CO2: 30 mmol/L (ref 22–32)
Calcium: 9.1 mg/dL (ref 8.9–10.3)
Creatinine, Ser: 0.65 mg/dL (ref 0.44–1.00)
GFR calc Af Amer: >60 mL/min (ref >60)
GFR calc non Af Amer: >60 mL/min (ref >60)
GLUCOSE: 99 mg/dL (ref 65–99)
POTASSIUM: 4 mmol/L (ref 3.5–5.1)
Sodium: 142 mmol/L (ref 135–145)

## 2015-04-01 NOTE — ED Notes (Signed)
Waiting for [facility] staff to pick up pt.

## 2015-04-01 NOTE — ED Notes (Signed)
Patient ambulated with a walker and one assist. Gait slow but steady. No complaints of pain during ambulation.

## 2015-04-01 NOTE — ED Notes (Signed)
Cleaned and dressed wound with telfa, 4x4, and wrapped with kerlex.  Circulation intact.

## 2015-04-01 NOTE — ED Provider Notes (Signed)
CSN: [id number]     Arrival date & time 04/01/15  [id number] History   First MD Initiated Contact with Patient 04/01/15 0730     Chief Complaint  Patient presents with  . Fall  . Head Injury     (Consider location/radiation/quality/duration/timing/severity/associated sxs/prior Treatment) HPI Comments: Patient from living facility after fall in the hallway. States they were having a fire drill and the person who was assisting her left her and she fell against the wall striking her head. Did not lose consciousness. Does not take any blood thinners. Endorses pain to her right shoulder, left ankle and left knee. Denies any head or neck pain. Denies any chest pain or shortness of breath. States she lost her balance and normally walks with a walker. She has Parkinson's disease and chronic tremor of her right arm.  Patient is a 81 y.o. female presenting with head injury. The history is provided by the patient and the EMS personnel. The history is limited by the condition of the patient.  Head Injury Associated symptoms: headache   Associated symptoms: no nausea, no neck pain and no vomiting     Past Medical History  Diagnosis Date  . Tremor   . Paralysis agitans ([place]) 09/20/2012  . Sciatica of left side   . Obese   . Diabetes mellitus without complication ([place])   . Dyslipidemia   . Migraine without aura, without mention of intractable migraine without mention of status migrainosus   . Hypertension   . Arthritis   . Cancer ([place])     basal cell-forehead   Past Surgical History  Procedure Laterality Date  . Appendectomy    . Basal cell carcinoma resection      Forehead  . Mouth surgery    . Tonsillectomy    . Arthroscopic surgery      Left knee  . Trigger finger surgery      Bilateral thumb  . Cataract extraction w/phaco Right 11/19/2013    Procedure: CATARACT EXTRACTION PHACO AND INTRAOCULAR LENS PLACEMENT (IOC);  Surgeon: Tonny Branch, MD;  Location: [facility];  Service: Ophthalmology;   Laterality: Right;  CDE:  12.95  . Cataract extraction w/phaco Left 12/17/2013    Procedure: CATARACT EXTRACTION PHACO AND INTRAOCULAR LENS PLACEMENT (IOC);  Surgeon: Tonny Branch, MD;  Location: [facility];  Service: Ophthalmology;  Laterality: Left;  CDE 12.10   Family History  Problem Relation Age of Onset  . Heart attack Father     not certain of COD  . Rheum arthritis Mother   . Hypertension Paternal Grandfather   . Stroke Paternal Grandfather   . Cirrhosis Paternal Grandmother    Social History  Substance Use Topics  . Smoking status: Never Smoker   . Smokeless tobacco: Never Used  . Alcohol Use: No   OB History    No data available     Review of Systems  Constitutional: Negative for fever, activity change, appetite change and fatigue.  HENT: Negative for congestion.   Respiratory: Negative for cough, chest tightness and shortness of breath.   Cardiovascular: Negative for chest pain.  Gastrointestinal: Negative for nausea, vomiting and abdominal pain.  Genitourinary: Negative for dysuria and hematuria.  Musculoskeletal: Positive for myalgias and arthralgias. Negative for back pain and neck pain.  Skin: Negative for rash.  Neurological: Positive for tremors and headaches. Negative for dizziness and weakness.  A complete 10 system review of systems was obtained and all systems are negative except as noted in the HPI and PMH.  Allergies  Sulfa antibiotics; Azilect; Fenofibrate; Adhesive; Sinemet; and Tomato  Home Medications   Prior to Admission medications   Medication Sig Start Date End Date Taking? Authorizing Provider  amantadine (SYMMETREL) 100 MG capsule Take 100 mg by mouth 2 (two) times daily. Starting 03/29/2015 x 7 days.   Yes Historical Provider, MD  aspirin 81 MG chewable tablet Chew 81 mg by mouth daily.   Yes Historical Provider, MD  diclofenac sodium (VOLTAREN) 1 % GEL Place 2 g onto the skin daily as needed (pain).  05/17/14  Yes Historical Provider, MD   furosemide (LASIX) 20 MG tablet Take 20 mg by mouth daily.   Yes Historical Provider, MD  mineral oil liquid Place into both ears once a week. Instill 2 drops in both ears once daily every Monday for Cerumen Impaction.   Yes Historical Provider, MD  naproxen sodium (ALEVE) 220 MG tablet Take 220 mg by mouth 2 (two) times daily as needed (Pain).   Yes Historical Provider, MD  polyethylene glycol (MIRALAX / GLYCOLAX) packet Take 17 g by mouth daily as needed for mild constipation.   Yes Historical Provider, MD  potassium chloride (K-DUR) 10 MEQ tablet Take 10 mEq by mouth daily.   Yes Historical Provider, MD  ramipril (ALTACE) 2.5 MG capsule Take 2.5 mg by mouth daily.   Yes Historical Provider, MD  vitamin B-12 (CYANOCOBALAMIN) 1000 MCG tablet Take 1,000 mcg by mouth daily.   Yes Historical Provider, MD  pramipexole (MIRAPEX) 0.25 MG tablet Take 0.25 mg by mouth daily. Starting 03/24/2015 x 5 days.    Historical Provider, MD   BP 125/56 mmHg  Pulse 76  Temp(Src) 97.6 F (36.4 C) (Oral)  Resp 16  Ht 5' (1.524 m)  Wt 126 lb (57.153 kg)  BMI 24.61 kg/m2  SpO2 96% Physical Exam  Constitutional: She is oriented to person, place, and time. She appears well-developed and well-nourished. No distress.  HENT:  Head: Normocephalic and atraumatic.  Mouth/Throat: Oropharynx is clear and moist. No oropharyngeal exudate.  Eyes: Conjunctivae and EOM are normal. Pupils are equal, round, and reactive to light.  Neck: Normal range of motion. Neck supple.  No C spine tenderness  Cardiovascular: Normal rate, regular rhythm, normal heart sounds and intact distal pulses.   No murmur heard. Pulmonary/Chest: Effort normal and breath sounds normal. No respiratory distress.  Abdominal: Soft. There is no tenderness. There is no rebound and no guarding.  Musculoskeletal: Normal range of motion. She exhibits tenderness. She exhibits no edema.  TTP L anterior and medial ankle. No deformity. Intact DP and PT  pulses TTP L lateral ankle.  ROM intact. FROM hips without pain No T or L spine tenderness Pain with ROM R shoulder, no deformity, radial pulse intact Abrasion to R forearm  Neurological: She is alert and oriented to person, place, and time. No cranial nerve deficit. She exhibits normal muscle tone. Coordination normal.   5/5 strength throughout. CN 2-12 intact.Equal grip strength. Sensation intact.  Tremor R upper extremity  Skin: Skin is warm.  Psychiatric: She has a normal mood and affect. Her behavior is normal.  Nursing note and vitals reviewed.   ED Course  Procedures (including critical care time) Labs Review Labs Reviewed  CBC WITH DIFFERENTIAL/PLATELET  BASIC METABOLIC PANEL    Imaging Review Dg Pelvis 1-2 Views  04/01/2015  CLINICAL DATA:  Pain following fall EXAM: PELVIS - 1-2 VIEW COMPARISON:  None. FINDINGS: There is no evidence of pelvic fracture or dislocation. There is  mild symmetric narrowing of both hip joints. No erosive change. IMPRESSION: Slight narrowing of both hip joints, symmetric. No fracture or dislocation. Electronically Signed   By: Lowella Grip III M.D.   On: 04/01/2015 08:29   Dg Shoulder Right  04/01/2015  CLINICAL DATA:  Fall while using walker with right shoulder pain, initial encounter EXAM: RIGHT SHOULDER - 2+ VIEW COMPARISON:  None. FINDINGS: There is no evidence of fracture or dislocation. There is no evidence of arthropathy or other focal bone abnormality. Soft tissues are unremarkable. IMPRESSION: No acute abnormality noted. Electronically Signed   By: Inez Catalina M.D.   On: 04/01/2015 08:28   Dg Ankle Complete Left  04/01/2015  CLINICAL DATA:  Left ankle pain after fall.  Initial encounter. EXAM: LEFT ANKLE COMPLETE - 3+ VIEW COMPARISON:  05/27/2014 FINDINGS: Periarticular soft tissue swelling without fracture or dislocation. No focal abnormality or joint narrowing.  Heel spur. IMPRESSION: Negative for fracture. Electronically Signed    By: Monte Fantasia M.D.   On: 04/01/2015 08:26   Ct Head Wo Contrast  04/01/2015  CLINICAL DATA:  Fall during Water quality scientist at nursing home this morning, initial encounter EXAM: CT HEAD WITHOUT CONTRAST CT CERVICAL SPINE WITHOUT CONTRAST TECHNIQUE: Multidetector CT imaging of the head and cervical spine was performed following the standard protocol without intravenous contrast. Multiplanar CT image reconstructions of the cervical spine were also generated. COMPARISON:  12/21/2014 FINDINGS: CT HEAD FINDINGS Bony calvarium is intact. No gross soft tissue abnormality is noted. The visualized paranasal sinuses and mastoid air cells are within normal limits. Atrophic changes are noted commenced with the patient's given age. Vague area of decreased attenuation is noted in the left thalamus/internal capsule posteriorly. This is best seen on image number 13 of series 2. This was not present on the prior exam. CT CERVICAL SPINE FINDINGS Seven cervical segments are well visualized. Vertebral body height is well maintained. Disc space narrowing is noted at C5-6 and C6-7 with osteophytic changes. Osteophytic changes are also noted at C4-5 and C7-T1. Facet hypertrophic changes are seen much more marked on the left than the right. The odontoid is within normal limits. No acute fracture is seen. The surrounding soft tissues show bilateral carotid calcifications. No other focal abnormality is seen. IMPRESSION: CT of the head: Mild atrophic changes. Vague area of decreased attenuation likely representing a a focus of acute to subacute ischemia. MRI may be helpful for further evaluation is indicated. CT of the cervical spine: Degenerative changes without acute abnormality. Electronically Signed   By: Inez Catalina M.D.   On: 04/01/2015 08:39   Ct Cervical Spine Wo Contrast  04/01/2015  CLINICAL DATA:  Fall during Water quality scientist at nursing home this morning, initial encounter EXAM: CT HEAD WITHOUT CONTRAST CT CERVICAL SPINE WITHOUT  CONTRAST TECHNIQUE: Multidetector CT imaging of the head and cervical spine was performed following the standard protocol without intravenous contrast. Multiplanar CT image reconstructions of the cervical spine were also generated. COMPARISON:  12/21/2014 FINDINGS: CT HEAD FINDINGS Bony calvarium is intact. No gross soft tissue abnormality is noted. The visualized paranasal sinuses and mastoid air cells are within normal limits. Atrophic changes are noted commenced with the patient's given age. Vague area of decreased attenuation is noted in the left thalamus/internal capsule posteriorly. This is best seen on image number 13 of series 2. This was not present on the prior exam. CT CERVICAL SPINE FINDINGS Seven cervical segments are well visualized. Vertebral body height is well maintained. Disc space narrowing  is noted at C5-6 and C6-7 with osteophytic changes. Osteophytic changes are also noted at C4-5 and C7-T1. Facet hypertrophic changes are seen much more marked on the left than the right. The odontoid is within normal limits. No acute fracture is seen. The surrounding soft tissues show bilateral carotid calcifications. No other focal abnormality is seen. IMPRESSION: CT of the head: Mild atrophic changes. Vague area of decreased attenuation likely representing a a focus of acute to subacute ischemia. MRI may be helpful for further evaluation is indicated. CT of the cervical spine: Degenerative changes without acute abnormality. Electronically Signed   By: Inez Catalina M.D.   On: 04/01/2015 08:39   Mr Brain Wo Contrast  04/01/2015  CLINICAL DATA:  Fall today. Weakness. Abnormal CT scan. Initial encounter EXAM: MRI HEAD WITHOUT CONTRAST TECHNIQUE: Multiplanar, multiecho pulse sequences of the brain and surrounding structures were obtained without intravenous contrast. COMPARISON:  CT head without contrast from the same day. FINDINGS: Mild atrophy and white changes bilaterally are within normal limits for age.  No acute infarct, hemorrhage, or mass lesion is present. The ventricles are proportionate to the degree of atrophy. No significant extracranial fluid is present. The brainstem and cerebellum are within normal limits. The internal auditory canals are unremarkable. Flow is present in the major intracranial arteries. Bilateral lens replacements are noted. The globes orbits are otherwise intact. The paranasal sinuses and mastoid air cells are clear. IMPRESSION: 1. Normal MRI appearance of the brain for age. No acute intracranial abnormality. Electronically Signed   By: San Morelle M.D.   On: 04/01/2015 10:30   Dg Knee Complete 4 Views Left  04/01/2015  CLINICAL DATA:  Pain following fall EXAM: LEFT KNEE - COMPLETE 4+ VIEW COMPARISON:  None. FINDINGS: Frontal, lateral, and bilateral oblique views were obtained. There is no fracture or dislocation. No joint effusion. There is moderate narrowing medially and in the patellofemoral joint region. Cortical irregularity along the distal femoral common bile medially is consistent with the osteoarthritic change. There is spurring along the posterior patellar surface and medially. Lesser spurring is noted laterally. There is also a spur along the anterior superior patella. No erosive change. IMPRESSION: Osteoarthritic change, most marked medially and in the patellofemoral joint region. No acute fracture. No joint effusion. Electronically Signed   By: Lowella Grip III M.D.   On: 04/01/2015 08:28   I have personally reviewed and evaluated these images and lab results as part of my medical decision-making.   EKG Interpretation   Date/Time:  Tuesday April 01 2015 07:54:34 EST Ventricular Rate:  60 PR Interval:  144 QRS Duration: 74 QT Interval:  404 QTC Calculation: 404 R Axis:   10 Text Interpretation:  Sinus rhythm Probable left atrial enlargement Low  voltage, precordial leads No significant change was found Confirmed by  Wyvonnia Dusky  MD, Breionna Punt  [phone]) on 04/01/2015 7:59:54 AM      MDM   Final diagnoses:  Fall, initial encounter   Mechanical fall with R shoulder, L ankle and L knee pain. Reported head injury.  X-rays are negative for fracture. CT head shows possible area of subacute ischemia. MRI recommended.  MRI does not show acute abnormality.   Abnormality on CT scan is not seen on MRI. Patient is able to ambulatory with her walker which is her baseline. She is tolerating by mouth. She appears stable for return to her facility.   [name], MD 04/01/15 [phone]

## 2015-04-01 NOTE — ED Notes (Signed)
Per EMS, patient from [facility] states they were having a fire drill this morning and patient tripped and fell, hitting the back of her head on a handrail. Denies LOC. Patient complaining of pain to right shoulder. Denies head pain.

## 2015-04-01 NOTE — Discharge Instructions (Signed)
Fall Prevention in Hospitals, Adult There is no evidence of fracture or serious injury. Follow up with your doctor. Return to the ED if you develop new or worsening symptoms. As a hospital patient, your condition and the treatments you receive can increase your risk for falls. Some additional risk factors for falls in a hospital include:  Being in an unfamiliar environment.  Being on bed rest.  Your surgery.  Taking certain medicines.  Your tubing requirements, such as intravenous (IV) therapy or catheters. It is important that you learn how to decrease fall risks while at the hospital. Below are important tips that can help prevent falls. SAFETY TIPS FOR PREVENTING FALLS Talk about your risk of falling.  Ask your health care provider why you are at risk for falling. Is it your medicine, illness, tubing placement, or something else?  Make a plan with your health care provider to keep you safe from falls.  Ask your health care provider or pharmacist about side effects of your medicines. Some medicines can make you dizzy or affect your coordination. Ask for help.  Ask for help before getting out of bed. You may need to press your call button.  Ask for assistance in getting safely to the toilet.  Ask for a walker or cane to be put at your bedside. Ask that most of the side rails on your bed be placed up before your health care provider leaves the room.  Ask family or friends to sit with you.  Ask for things that are out of your reach, such as your glasses, hearing aids, telephone, bedside table, or call button. Follow these tips to avoid falling:  Stay lying or seated, rather than standing, while waiting for help.  Wear rubber-soled slippers or shoes whenever you walk in the hospital.  Avoid quick, sudden movements.  Change positions slowly.  Sit on the side of your bed before standing.  Stand up slowly and wait before you start to walk.  Let your health care provider know  if there is a spill on the floor.  Pay careful attention to the medical equipment, electrical cords, and tubes around you.  When you need help, use your call button by your bed or in the bathroom. Wait for one of your health care providers to help you.  If you feel dizzy or unsure of your footing, return to bed and wait for assistance.  Avoid being distracted by the TV, telephone, or another person in your room.  Do not lean or support yourself on rolling objects, such as IV poles or bedside tables.   This information is not intended to replace advice given to you by your health care provider. Make sure you discuss any questions you have with your health care provider.   Document Released: 04/23/2000 Document Revised: 05/17/2014 Document Reviewed: 01/02/2012 Elsevier Interactive Patient Education [employer].

## 2015-04-01 NOTE — ED Notes (Signed)
Patient states "we had a fire drill and they was helping me walk out, but they saw another woman sitting on the floor so they let go of me and went to her and I fell." Patient has small abrasion noted to right forearm. Complaining of pain to right shoulder. Assisted patient to bedside commode, patient started complaining of pain to left ankle also. Denies head pain or LOC.

## 2015-04-30 ENCOUNTER — Emergency Department (HOSPITAL_COMMUNITY)
Admission: EM | Admit: 2015-04-30 | Discharge: 2015-05-01 | Disposition: A | Discharge status: Home / Self Care | Payer: Medicare Other | Attending: Emergency Medicine | Admitting: Emergency Medicine

## 2015-04-30 ENCOUNTER — Encounter (HOSPITAL_COMMUNITY): Payer: Self-pay

## 2015-04-30 ENCOUNTER — Emergency Department (HOSPITAL_COMMUNITY): Payer: Medicare Other

## 2015-04-30 DIAGNOSIS — I10 Essential (primary) hypertension: Secondary | ICD-10-CM | POA: Insufficient documentation

## 2015-04-30 DIAGNOSIS — R509 Fever, unspecified: Secondary | ICD-10-CM | POA: Diagnosis not present

## 2015-04-30 DIAGNOSIS — S0081XA Abrasion of other part of head, initial encounter: Secondary | ICD-10-CM | POA: Diagnosis not present

## 2015-04-30 DIAGNOSIS — Z79899 Other long term (current) drug therapy: Secondary | ICD-10-CM | POA: Diagnosis not present

## 2015-04-30 DIAGNOSIS — R0981 Nasal congestion: Secondary | ICD-10-CM | POA: Insufficient documentation

## 2015-04-30 DIAGNOSIS — Y9389 Activity, other specified: Secondary | ICD-10-CM | POA: Diagnosis not present

## 2015-04-30 DIAGNOSIS — E669 Obesity, unspecified: Secondary | ICD-10-CM | POA: Diagnosis not present

## 2015-04-30 DIAGNOSIS — M1990 Unspecified osteoarthritis, unspecified site: Secondary | ICD-10-CM | POA: Diagnosis not present

## 2015-04-30 DIAGNOSIS — Z859 Personal history of malignant neoplasm, unspecified: Secondary | ICD-10-CM | POA: Insufficient documentation

## 2015-04-30 DIAGNOSIS — W19XXXA Unspecified fall, initial encounter: Secondary | ICD-10-CM

## 2015-04-30 DIAGNOSIS — Y9289 Other specified places as the place of occurrence of the external cause: Secondary | ICD-10-CM | POA: Diagnosis not present

## 2015-04-30 DIAGNOSIS — Y998 Other external cause status: Secondary | ICD-10-CM | POA: Insufficient documentation

## 2015-04-30 DIAGNOSIS — W01198A Fall on same level from slipping, tripping and stumbling with subsequent striking against other object, initial encounter: Secondary | ICD-10-CM | POA: Insufficient documentation

## 2015-04-30 DIAGNOSIS — R067 Sneezing: Secondary | ICD-10-CM | POA: Insufficient documentation

## 2015-04-30 DIAGNOSIS — R05 Cough: Secondary | ICD-10-CM | POA: Insufficient documentation

## 2015-04-30 DIAGNOSIS — G20 Parkinson's disease: Secondary | ICD-10-CM | POA: Diagnosis not present

## 2015-04-30 DIAGNOSIS — S0083XA Contusion of other part of head, initial encounter: Secondary | ICD-10-CM | POA: Diagnosis not present

## 2015-04-30 DIAGNOSIS — S0093XA Contusion of unspecified part of head, initial encounter: Secondary | ICD-10-CM

## 2015-04-30 DIAGNOSIS — Z7982 Long term (current) use of aspirin: Secondary | ICD-10-CM | POA: Diagnosis not present

## 2015-04-30 DIAGNOSIS — S0990XA Unspecified injury of head, initial encounter: Secondary | ICD-10-CM

## 2015-04-30 DIAGNOSIS — E119 Type 2 diabetes mellitus without complications: Secondary | ICD-10-CM | POA: Diagnosis not present

## 2015-04-30 HISTORY — DX: Parkinson's disease: G20

## 2015-04-30 HISTORY — DX: Parkinson's disease without dyskinesia, without mention of fluctuations: G20.A1

## 2015-04-30 LAB — URINALYSIS, ROUTINE W REFLEX MICROSCOPIC
BILIRUBIN URINE: NEGATIVE
Glucose, UA: NEGATIVE mg/dL
HGB URINE DIPSTICK: NEGATIVE
Ketones, ur: NEGATIVE mg/dL
Leukocytes, UA: NEGATIVE
NITRITE: NEGATIVE
PROTEIN: NEGATIVE mg/dL
Specific Gravity, Urine: >1.03 — ABNORMAL HIGH (ref 1.005–1.030)
pH: 5.5 (ref 5.0–8.0)

## 2015-04-30 NOTE — ED Provider Notes (Signed)
CSN: [id number]     Arrival date & time 04/30/15  1946 History  By signing my name below, I, Jolayne Panther, attest that this documentation has been prepared under the direction and in the presence of Daleen Bo, MD. Electronically Signed: Jolayne Panther, [place]. 04/30/2015. 8:42 PM.    Chief Complaint  Patient presents with  . Fall    The history is provided by the patient. No language interpreter was used.    HPI Comments: Catherine [name] is a 81 y.o. female who presents to the Emergency Department complaining of a fall earlier today which occurred after she turned around while trying to get clothes out of the closet. Pt states that she hit her head on the door and fell onto the floor. She also reports scraping some of the skin off of her third finger on her left hand during the fall. Pt reports that she ate well today and also notes a fever after her fall which she can not remember the exact measured temperature of. She also reports a recent sinus infection with associated sneezing, coughing, and congestion. She denies neck pain, chest pain, SOB, constipation, diarrhea, and trouble urinating. Pt notes that she normally uses a walker to assist her with ambulation.  Past Medical History  Diagnosis Date  . Tremor   . Paralysis agitans ([place]) 09/20/2012  . Sciatica of left side   . Obese   . Diabetes mellitus without complication ([place])   . Dyslipidemia   . Migraine without aura, without mention of intractable migraine without mention of status migrainosus   . Hypertension   . Arthritis   . Cancer ([place])     basal cell-forehead  . Parkinson's disease [facility])    Past Surgical History  Procedure Laterality Date  . Appendectomy    . Basal cell carcinoma resection      Forehead  . Mouth surgery    . Tonsillectomy    . Arthroscopic surgery      Left knee  . Trigger finger surgery      Bilateral thumb  . Cataract extraction w/phaco Right 11/19/2013    Procedure: CATARACT  EXTRACTION PHACO AND INTRAOCULAR LENS PLACEMENT (IOC);  Surgeon: Tonny Branch, MD;  Location: [facility];  Service: Ophthalmology;  Laterality: Right;  CDE:  12.95  . Cataract extraction w/phaco Left 12/17/2013    Procedure: CATARACT EXTRACTION PHACO AND INTRAOCULAR LENS PLACEMENT (IOC);  Surgeon: Tonny Branch, MD;  Location: [facility];  Service: Ophthalmology;  Laterality: Left;  CDE 12.10   Family History  Problem Relation Age of Onset  . Heart attack Father     not certain of COD  . Rheum arthritis Mother   . Hypertension Paternal Grandfather   . Stroke Paternal Grandfather   . Cirrhosis Paternal Grandmother    Social History  Substance Use Topics  . Smoking status: Never Smoker   . Smokeless tobacco: Never Used  . Alcohol Use: No   OB History    No data available     Review of Systems  Constitutional: Positive for fever.  HENT: Positive for congestion and sneezing.   Respiratory: Positive for cough. Negative for shortness of breath.   Cardiovascular: Negative for chest pain.  Gastrointestinal: Negative for diarrhea and constipation.  Genitourinary: Negative for difficulty urinating.  Musculoskeletal: Negative for neck pain.  Skin: Positive for wound.  All other systems reviewed and are negative.   Allergies  Sulfa antibiotics; Azilect; Fenofibrate; Adhesive; Sinemet; and Tomato  Home Medications  Prior to Admission medications   Medication Sig Start Date End Date Taking? Authorizing Provider  amantadine (SYMMETREL) 100 MG capsule Take 100 mg by mouth 3 (three) times daily.    Yes Historical Provider, MD  aspirin 81 MG chewable tablet Chew 81 mg by mouth daily.   Yes Historical Provider, MD  benzonatate (TESSALON) 100 MG capsule Take 100 mg by mouth 2 (two) times daily. 2 week course for cough to be completed on 05/07/15   Yes Historical Provider, MD  diclofenac sodium (VOLTAREN) 1 % GEL Place 2 g onto the skin daily as needed (pain).  05/17/14  Yes Historical Provider, MD   furosemide (LASIX) 20 MG tablet Take 20 mg by mouth daily.   Yes Historical Provider, MD  loratadine (CLARITIN) 10 MG tablet Take 10 mg by mouth daily.   Yes Historical Provider, MD  mineral oil liquid Place into both ears every Monday. Instill 2 drops in both ears once daily every Monday for Cerumen Impaction.   Yes Historical Provider, MD  naproxen sodium (ALEVE) 220 MG tablet Take 220 mg by mouth 2 (two) times daily as needed (Pain).   Yes Historical Provider, MD  polyethylene glycol (MIRALAX / GLYCOLAX) packet Take 17 g by mouth daily as needed for mild constipation.   Yes Historical Provider, MD  potassium chloride (K-DUR) 10 MEQ tablet Take 10 mEq by mouth daily.   Yes Historical Provider, MD  ramipril (ALTACE) 2.5 MG capsule Take 2.5 mg by mouth daily.   Yes Historical Provider, MD  senna (SENOKOT) 8.6 MG TABS tablet Take 1 tablet by mouth at bedtime.   Yes Historical Provider, MD  vitamin B-12 (CYANOCOBALAMIN) 1000 MCG tablet Take 1,000 mcg by mouth daily.   Yes Historical Provider, MD  azithromycin (ZITHROMAX) 250 MG tablet Take 250-500 mg by mouth See admin instructions. Reported on 04/30/2015    Historical Provider, MD  dextromethorphan-guaiFENesin (ROBITUSSIN COLD COUGH+ CHEST) 10-100 MG/5ML liquid Take 20 mLs by mouth 3 (three) times daily. Reported on 04/30/2015    Historical Provider, MD   BP 124/63 mmHg  Pulse 71  Temp(Src) 97.6 F (36.4 C) (Oral)  Resp 16  Ht 5' (1.524 m)  Wt 133 lb (60.328 kg)  BMI 25.97 kg/m2  SpO2 100% Physical Exam  Constitutional: She is oriented to person, place, and time. She appears well-developed and well-nourished.  HENT:  Head: Normocephalic and atraumatic.  Abrasion to the right forhead  Eyes: Conjunctivae and EOM are normal. Pupils are equal, round, and reactive to light.  Neck: Normal range of motion and phonation normal. Neck supple.  Cardiovascular: Normal rate and regular rhythm.   Pulmonary/Chest: Effort normal and breath sounds  normal. She exhibits no tenderness.  Abdominal: Soft. She exhibits no distension. There is no tenderness. There is no guarding.  Musculoskeletal: Normal range of motion.  2+ edema of lower legs Normal ROM of both the arms and legs  Neurological: She is alert and oriented to person, place, and time. She exhibits normal muscle tone.  Skin: Skin is warm and dry.  Psychiatric: She has a normal mood and affect. Her behavior is normal. Judgment and thought content normal.  Nursing note and vitals reviewed.   ED Course  Procedures  DIAGNOSTIC STUDIES:    Oxygen Saturation is 100% on RA, normal by my interpretation.   COORDINATION OF CARE:  8:27 PM Will order xray of pt's head. Will order a urine sample. Discussed treatment plan with pt at bedside and pt agreed to plan.  Patient Vitals for the past 24 hrs:  BP Temp Temp src Pulse Resp SpO2 Height Weight  04/30/15 2244 124/63 mmHg - - 71 16 100 % - -  04/30/15 2230 124/63 mmHg - - 72 - 96 % - -  04/30/15 2200 133/55 mmHg - - 75 - 98 % - -  04/30/15 2130 (!) 128/53 mmHg - - 72 - 97 % - -  04/30/15 2048 134/60 mmHg - - 72 15 100 % - -  04/30/15 2000 141/61 mmHg - - 75 - 100 % - -  04/30/15 1951 145/63 mmHg 97.6 F (36.4 C) Oral 75 16 100 % 5' (1.524 m) 133 lb (60.328 kg)    Labs Review Labs Reviewed  URINALYSIS, ROUTINE W REFLEX MICROSCOPIC (NOT AT [facility]) - Abnormal; Notable for the following:    Specific Gravity, Urine >1.030 (*)    All other components within normal limits  URINE CULTURE    Imaging Review Ct Head Wo Contrast  04/30/2015  CLINICAL DATA:  81 year old female with fall and trauma to the head. EXAM: CT HEAD WITHOUT CONTRAST CT CERVICAL SPINE WITHOUT CONTRAST TECHNIQUE: Multidetector CT imaging of the head and cervical spine was performed following the standard protocol without intravenous contrast. Multiplanar CT image reconstructions of the cervical spine were also generated. COMPARISON:  Brain MRI dated 04/01/2015  and CT dated 04/01/2015 FINDINGS: CT HEAD FINDINGS There is slight prominence of the ventricles and sulci compatible with age-related volume loss. Mild periventricular and deep white matter hypodensities represent chronic microvascular ischemic changes. There is no intracranial hemorrhage. No mass effect or midline shift identified. There is partial as mentioned of the visualized right maxillary sinus. The remainder of the paranasal sinuses and mastoid air cells are well aerated. The calvarium is intact. CT CERVICAL SPINE FINDINGS There is no acute fracture or subluxation of the cervical spine.There multilevel degenerative changes of the spine most prominent at C5-C6 and C6-C7. There is endplate irregularity and disc space narrowing.The odontoid and spinous processes are intact.There is normal anatomic alignment of the C1-C2 lateral masses. The visualized soft tissues appear unremarkable. IMPRESSION: No acute intracranial hemorrhage. Mild age-related atrophy and chronic microvascular ischemic disease. No acute/traumatic cervical spine pathology. Electronically Signed   By: Anner Crete M.D.   On: 04/30/2015 21:16   Ct Cervical Spine Wo Contrast  04/30/2015  CLINICAL DATA:  81 year old female with fall and trauma to the head. EXAM: CT HEAD WITHOUT CONTRAST CT CERVICAL SPINE WITHOUT CONTRAST TECHNIQUE: Multidetector CT imaging of the head and cervical spine was performed following the standard protocol without intravenous contrast. Multiplanar CT image reconstructions of the cervical spine were also generated. COMPARISON:  Brain MRI dated 04/01/2015 and CT dated 04/01/2015 FINDINGS: CT HEAD FINDINGS There is slight prominence of the ventricles and sulci compatible with age-related volume loss. Mild periventricular and deep white matter hypodensities represent chronic microvascular ischemic changes. There is no intracranial hemorrhage. No mass effect or midline shift identified. There is partial as mentioned of  the visualized right maxillary sinus. The remainder of the paranasal sinuses and mastoid air cells are well aerated. The calvarium is intact. CT CERVICAL SPINE FINDINGS There is no acute fracture or subluxation of the cervical spine.There multilevel degenerative changes of the spine most prominent at C5-C6 and C6-C7. There is endplate irregularity and disc space narrowing.The odontoid and spinous processes are intact.There is normal anatomic alignment of the C1-C2 lateral masses. The visualized soft tissues appear unremarkable. IMPRESSION: No acute intracranial hemorrhage. Mild age-related atrophy and  chronic microvascular ischemic disease. No acute/traumatic cervical spine pathology. Electronically Signed   By: Anner Crete M.D.   On: 04/30/2015 21:16   I have personally reviewed and evaluated these images and lab results as part of my medical decision-making.   MDM   Final diagnoses:  Fall, initial encounter  Contusion of head, initial encounter  Head injury, initial encounter    Fall, without serious injury. No specific provoking factor found. Doubt UTI, serious bacterial infection or metabolic instability.   Nursing Notes Reviewed/ Care Coordinated Applicable Imaging Reviewed Interpretation of Laboratory Data incorporated into ED treatment  The patient appears reasonably screened and/or stabilized for discharge and I doubt any other medical condition or other [facility] requiring further screening, evaluation, or treatment in the ED at this time prior to discharge.  Plan: Home Medications- usual; Home Treatments- rest; return here if the recommended treatment, does not improve the symptoms; Recommended follow up- PCP prn  I personally performed the services described in this documentation, which was scribed in my presence. The recorded information has been reviewed and is accurate.     Daleen Bo, MD 04/30/15 [phone]

## 2015-04-30 NOTE — Discharge Instructions (Signed)
Contusion A contusion is a deep bruise. Contusions are the result of a blunt injury to tissues and muscle fibers under the skin. The injury causes bleeding under the skin. The skin overlying the contusion may turn blue, purple, or yellow. Minor injuries will give you a painless contusion, but more severe contusions may stay painful and swollen for a few weeks.  CAUSES  This condition is usually caused by a blow, trauma, or direct force to an area of the body. SYMPTOMS  Symptoms of this condition include:  Swelling of the injured area.  Pain and tenderness in the injured area.  Discoloration. The area may have redness and then turn blue, purple, or yellow. DIAGNOSIS  This condition is diagnosed based on a physical exam and medical history. An X-ray, CT scan, or MRI may be needed to determine if there are any associated injuries, such as broken bones (fractures). TREATMENT  Specific treatment for this condition depends on what area of the body was injured. In general, the best treatment for a contusion is resting, icing, applying pressure to (compression), and elevating the injured area. This is often called the RICE strategy. Over-the-counter anti-inflammatory medicines may also be recommended for pain control.  HOME CARE INSTRUCTIONS   Rest the injured area.  If directed, apply ice to the injured area:  Put ice in a plastic bag.  Place a towel between your skin and the bag.  Leave the ice on for 20 minutes, 2-3 times per day.  If directed, apply light compression to the injured area using an elastic bandage. Make sure the bandage is not wrapped too tightly. Remove and reapply the bandage as directed by your health care provider.  If possible, raise (elevate) the injured area above the level of your heart while you are sitting or lying down.  Take over-the-counter and prescription medicines only as told by your health care provider. SEEK MEDICAL CARE IF:  Your symptoms do not  improve after several days of treatment.  Your symptoms get worse.  You have difficulty moving the injured area. SEEK IMMEDIATE MEDICAL CARE IF:   You have severe pain.  You have numbness in a hand or foot.  Your hand or foot turns pale or cold.   This information is not intended to replace advice given to you by your health care provider. Make sure you discuss any questions you have with your health care provider.   Document Released: 02/03/2005 Document Revised: 01/15/2015 Document Reviewed: 09/11/2014 Elsevier Interactive Patient Education 2016 [place] Injury, Adult You have a head injury. Headaches and throwing up (vomiting) are common after a head injury. It should be easy to wake up from sleeping. Sometimes you must stay in the hospital. Most problems happen within the first 24 hours. Side effects may occur up to 7-10 days after the injury.  WHAT ARE THE TYPES OF HEAD INJURIES? Head injuries can be as minor as a bump. Some head injuries can be more severe. More severe head injuries include:  A jarring injury to the brain (concussion).  A bruise of the brain (contusion). This mean there is bleeding in the brain that can cause swelling.  A cracked skull (skull fracture).  Bleeding in the brain that collects, clots, and forms a bump (hematoma). WHEN SHOULD I GET HELP RIGHT AWAY?   You are confused or sleepy.  You cannot be woken up.  You feel sick to your stomach (nauseous) or keep throwing up (vomiting).  Your dizziness or unsteadiness  is getting worse.  You have very bad, lasting headaches that are not helped by medicine. Take medicines only as told by your doctor.  You cannot use your arms or legs like normal.  You cannot walk.  You notice changes in the black spots in the center of the colored part of your eye (pupil).  You have clear or bloody fluid coming from your nose or ears.  You have trouble seeing. During the next 24 hours after the  injury, you must stay with someone who can watch you. This person should get help right away (call 911 in the [place] if you start to shake and are not able to control it (have seizures), you pass out, or you are unable to wake up. HOW CAN I PREVENT A HEAD INJURY IN THE FUTURE?  Wear seat belts.  Wear a helmet while bike riding and playing sports like football.  Stay away from dangerous activities around the house. WHEN CAN I RETURN TO NORMAL ACTIVITIES AND ATHLETICS? See your doctor before doing these activities. You should not do normal activities or play contact sports until 1 week after the following symptoms have stopped:  Headache that does not go away.  Dizziness.  Poor attention.  Confusion.  Memory problems.  Sickness to your stomach or throwing up.  Tiredness.  Fussiness.  Bothered by bright lights or loud noises.  Anxiousness or depression.  Restless sleep. MAKE SURE YOU:   Understand these instructions.  Will watch your condition.  Will get help right away if you are not doing well or get worse.   This information is not intended to replace advice given to you by your health care provider. Make sure you discuss any questions you have with your health care provider.   Document Released: 04/08/2008 Document Revised: 05/17/2014 Document Reviewed: 01/01/2013 Elsevier Interactive Patient Education [employer].

## 2015-04-30 NOTE — ED Notes (Signed)
Patient here from [facility]. States she was trying to get clothes out of closet and turned around to fast and fell to the right onto the floor hitting head on floor. Patient denies dizziness or LOC. Patient states "I don't have any pain, my head just feels funny." Patient states she uses walker for assistance. Skin tear noted to left ring finger. Bleeding controlled at this time. Patient alert and oriented x4.

## 2015-05-01 NOTE — ED Notes (Addendum)
Documented on wrong pt

## 2015-05-02 LAB — URINE CULTURE: Culture: NO GROWTH

## 2015-05-08 ENCOUNTER — Other Ambulatory Visit (HOSPITAL_COMMUNITY)
Admission: RE | Admit: 2015-05-08 | Discharge: 2015-05-08 | Disposition: A | Discharge status: Home / Self Care | Payer: Medicare Other | Source: Skilled Nursing Facility | Attending: Nephrology | Admitting: Nephrology

## 2015-05-08 DIAGNOSIS — N390 Urinary tract infection, site not specified: Secondary | ICD-10-CM | POA: Insufficient documentation

## 2015-05-08 LAB — URINALYSIS, ROUTINE W REFLEX MICROSCOPIC
BILIRUBIN URINE: NEGATIVE
Glucose, UA: NEGATIVE mg/dL
HGB URINE DIPSTICK: NEGATIVE
KETONES UR: NEGATIVE mg/dL
Leukocytes, UA: NEGATIVE
NITRITE: NEGATIVE
PH: 5.5 (ref 5.0–8.0)
Protein, ur: NEGATIVE mg/dL

## 2015-05-11 LAB — URINE CULTURE

## 2015-05-22 ENCOUNTER — Emergency Department (HOSPITAL_COMMUNITY): Payer: Medicare Other

## 2015-05-22 ENCOUNTER — Encounter (HOSPITAL_COMMUNITY): Payer: Self-pay | Admitting: Emergency Medicine

## 2015-05-22 ENCOUNTER — Inpatient Hospital Stay (HOSPITAL_COMMUNITY)
Admission: EM | Admit: 2015-05-22 | Discharge: 2015-05-26 | DRG: 482 | Disposition: A | Discharge status: Skilled Nursing Facility | Payer: Medicare Other | Attending: Internal Medicine | Admitting: Internal Medicine

## 2015-05-22 DIAGNOSIS — G20A1 Parkinson's disease without dyskinesia, without mention of fluctuations: Secondary | ICD-10-CM | POA: Diagnosis present

## 2015-05-22 DIAGNOSIS — S72012A Unspecified intracapsular fracture of left femur, initial encounter for closed fracture: Secondary | ICD-10-CM | POA: Diagnosis present

## 2015-05-22 DIAGNOSIS — Z7982 Long term (current) use of aspirin: Secondary | ICD-10-CM | POA: Diagnosis not present

## 2015-05-22 DIAGNOSIS — S72002A Fracture of unspecified part of neck of left femur, initial encounter for closed fracture: Secondary | ICD-10-CM | POA: Diagnosis present

## 2015-05-22 DIAGNOSIS — E785 Hyperlipidemia, unspecified: Secondary | ICD-10-CM | POA: Diagnosis present

## 2015-05-22 DIAGNOSIS — M25552 Pain in left hip: Secondary | ICD-10-CM | POA: Diagnosis present

## 2015-05-22 DIAGNOSIS — Z66 Do not resuscitate: Secondary | ICD-10-CM | POA: Diagnosis present

## 2015-05-22 DIAGNOSIS — G20C Parkinsonism, unspecified: Secondary | ICD-10-CM | POA: Diagnosis present

## 2015-05-22 DIAGNOSIS — W010XXA Fall on same level from slipping, tripping and stumbling without subsequent striking against object, initial encounter: Secondary | ICD-10-CM | POA: Diagnosis present

## 2015-05-22 DIAGNOSIS — G20 Parkinson's disease: Secondary | ICD-10-CM | POA: Diagnosis present

## 2015-05-22 DIAGNOSIS — S72002D Fracture of unspecified part of neck of left femur, subsequent encounter for closed fracture with routine healing: Secondary | ICD-10-CM | POA: Diagnosis not present

## 2015-05-22 DIAGNOSIS — T148XXA Other injury of unspecified body region, initial encounter: Secondary | ICD-10-CM

## 2015-05-22 DIAGNOSIS — I10 Essential (primary) hypertension: Secondary | ICD-10-CM | POA: Diagnosis present

## 2015-05-22 DIAGNOSIS — Y92128 Other place in nursing home as the place of occurrence of the external cause: Secondary | ICD-10-CM

## 2015-05-22 DIAGNOSIS — S72009A Fracture of unspecified part of neck of unspecified femur, initial encounter for closed fracture: Secondary | ICD-10-CM | POA: Insufficient documentation

## 2015-05-22 DIAGNOSIS — Z823 Family history of stroke: Secondary | ICD-10-CM | POA: Diagnosis not present

## 2015-05-22 DIAGNOSIS — M1990 Unspecified osteoarthritis, unspecified site: Secondary | ICD-10-CM | POA: Diagnosis present

## 2015-05-22 DIAGNOSIS — Z85828 Personal history of other malignant neoplasm of skin: Secondary | ICD-10-CM

## 2015-05-22 DIAGNOSIS — Z8249 Family history of ischemic heart disease and other diseases of the circulatory system: Secondary | ICD-10-CM | POA: Diagnosis not present

## 2015-05-22 DIAGNOSIS — E119 Type 2 diabetes mellitus without complications: Secondary | ICD-10-CM

## 2015-05-22 LAB — PREPARE RBC (CROSSMATCH)

## 2015-05-22 LAB — URINALYSIS, ROUTINE W REFLEX MICROSCOPIC
BILIRUBIN URINE: NEGATIVE
GLUCOSE, UA: NEGATIVE mg/dL
HGB URINE DIPSTICK: NEGATIVE
KETONES UR: NEGATIVE mg/dL
LEUKOCYTES UA: NEGATIVE
Nitrite: NEGATIVE
PH: 6 (ref 5.0–8.0)
PROTEIN: NEGATIVE mg/dL
Specific Gravity, Urine: 1.025 (ref 1.005–1.030)

## 2015-05-22 LAB — CBC WITH DIFFERENTIAL/PLATELET
BASOS PCT: 0 %
Basophils Absolute: 0.1 10*3/uL (ref 0.0–0.1)
EOS ABS: 0.2 10*3/uL (ref 0.0–0.7)
EOS PCT: 1 %
HCT: 40.1 % (ref 36.0–46.0)
HEMOGLOBIN: 13.6 g/dL (ref 12.0–15.0)
Lymphocytes Relative: 11 %
Lymphs Abs: 1.5 10*3/uL (ref 0.7–4.0)
MCH: 31.9 pg (ref 26.0–34.0)
MCHC: 33.9 g/dL (ref 30.0–36.0)
MCV: 94.1 fL (ref 78.0–100.0)
MONOS PCT: 10 %
Monocytes Absolute: 1.3 10*3/uL — ABNORMAL HIGH (ref 0.1–1.0)
NEUTROS PCT: 78 %
Neutro Abs: 10.9 10*3/uL — ABNORMAL HIGH (ref 1.7–7.7)
PLATELETS: 173 10*3/uL (ref 150–400)
RBC: 4.26 MIL/uL (ref 3.87–5.11)
RDW: 13.5 % (ref 11.5–15.5)
WBC: 13.9 10*3/uL — AB (ref 4.0–10.5)

## 2015-05-22 LAB — PROTIME-INR
INR: 1.18 (ref 0.00–1.49)
PROTHROMBIN TIME: 15.1 s (ref 11.6–15.2)

## 2015-05-22 LAB — BASIC METABOLIC PANEL
ANION GAP: 11 (ref 5–15)
BUN: 22 mg/dL — ABNORMAL HIGH (ref 6–20)
CALCIUM: 9.4 mg/dL (ref 8.9–10.3)
CO2: 27 mmol/L (ref 22–32)
Chloride: 102 mmol/L (ref 101–111)
Creatinine, Ser: 0.88 mg/dL (ref 0.44–1.00)
GFR, EST NON AFRICAN AMERICAN: 58 mL/min — AB (ref >60)
GLUCOSE: 143 mg/dL — AB (ref 65–99)
Potassium: 4.1 mmol/L (ref 3.5–5.1)
Sodium: 140 mmol/L (ref 135–145)

## 2015-05-22 LAB — TROPONIN I: Troponin I: <0.03 ng/mL (ref <0.031)

## 2015-05-22 MED ORDER — SODIUM CHLORIDE 0.9 % IV SOLN: Freq: Once | INTRAVENOUS | Status: DC

## 2015-05-22 MED ORDER — MORPHINE SULFATE (PF) 2 MG/ML IV SOLN
2.0000 mg | INTRAVENOUS | Status: DC | PRN
  Administered 2015-05-22: 2 mg via INTRAVENOUS
  Filled 2015-05-22: qty 1

## 2015-05-22 MED ORDER — SODIUM CHLORIDE 0.9 % IV SOLN
INTRAVENOUS | Status: DC
  Administered 2015-05-22 – 2015-05-24 (×3): via INTRAVENOUS

## 2015-05-22 NOTE — ED Notes (Signed)
Catherine [name]-neighbor [phone] home Cell [phone]

## 2015-05-22 NOTE — Consult Note (Signed)
HOSPITAL CONSULT [id number] (HIP FRACTURE )  MDM= MODERATE COMPLEXITY COMP HISTORY COMP EXAM  Patient ID: Catherine [name], female   DOB: 1928-05-03, 82 y.o.   MRN: [id number]  New patient   Chief Complaint  Patient presents with  . Fall  Fracture left hip    Catherine [name] is a 82 y.o. female.   HPI 82 yo with Parkinsons, resides at local Nursing home  C/o left hip pain   Location left hip duartion 1 day (1.12.17) Severity 8 Quality ache Modified by movement  Review of Systems (all) Review of Systems  Gastrointestinal: Positive for constipation.  Musculoskeletal: Positive for falls.  Neurological: Positive for tremors.  All other systems reviewed and are negative.   Past Medical History  Diagnosis Date  . Tremor   . Paralysis agitans ([place]) 09/20/2012  . Sciatica of left side   . Obese   . Diabetes mellitus without complication ([place])   . Dyslipidemia   . Migraine without aura, without mention of intractable migraine without mention of status migrainosus   . Hypertension   . Arthritis   . Cancer ([place])     basal cell-forehead  . Parkinson's disease [facility])     Past Surgical History  Procedure Laterality Date  . Appendectomy    . Basal cell carcinoma resection      Forehead  . Mouth surgery    . Tonsillectomy    . Arthroscopic surgery      Left knee  . Trigger finger surgery      Bilateral thumb  . Cataract extraction w/phaco Right 11/19/2013    Procedure: CATARACT EXTRACTION PHACO AND INTRAOCULAR LENS PLACEMENT (IOC);  Surgeon: Tonny Branch, MD;  Location: [facility];  Service: Ophthalmology;  Laterality: Right;  CDE:  12.95  . Cataract extraction w/phaco Left 12/17/2013    Procedure: CATARACT EXTRACTION PHACO AND INTRAOCULAR LENS PLACEMENT (IOC);  Surgeon: Tonny Branch, MD;  Location: [facility];  Service: Ophthalmology;  Laterality: Left;  CDE 12.10     Family History  Problem Relation Age of Onset  . Heart attack Father     not certain of COD  . Rheum arthritis Mother    . Hypertension Paternal Grandfather   . Stroke Paternal Grandfather   . Cirrhosis Paternal Grandmother      Social History Social History  Substance Use Topics  . Smoking status: Never Smoker   . Smokeless tobacco: Never Used  . Alcohol Use: No     Allergies  Allergen Reactions  . Sulfa Antibiotics Rash  . Azilect [Rasagiline] Rash  . Fenofibrate Rash  . Adhesive [Tape] Rash and Other (See Comments)    Redness  . Sinemet [Carbidopa-Levodopa] Rash  . Tomato Rash    Current Facility-Administered Medications  Medication Dose Route Frequency Provider Last Rate Last Dose  . 0.9 %  sodium chloride infusion   Intravenous Continuous Francine Graven, DO 75 mL/hr at 05/22/15 2140    . 0.9 %  sodium chloride infusion   Intravenous Once Carole Civil, MD      . morphine 2 MG/ML injection 2 mg  2 mg Intravenous Q1H PRN Francine Graven, DO   2 mg at 05/22/15 2136   Current Outpatient Prescriptions  Medication Sig Dispense Refill  . amantadine (SYMMETREL) 100 MG capsule Take 100 mg by mouth 3 (three) times daily.     Marland Kitchen aspirin 81 MG chewable tablet Chew 81 mg by mouth daily.    Marland Kitchen azithromycin (ZITHROMAX) 250 MG tablet Take 250-500  mg by mouth See admin instructions. Reported on 04/30/2015    . benzonatate (TESSALON) 100 MG capsule Take 100 mg by mouth 2 (two) times daily. 2 week course for cough to be completed on 05/07/15    . dextromethorphan-guaiFENesin (ROBITUSSIN COLD COUGH+ CHEST) 10-100 MG/5ML liquid Take 20 mLs by mouth 3 (three) times daily. Reported on 04/30/2015    . diclofenac sodium (VOLTAREN) 1 % GEL Place 2 g onto the skin daily as needed (pain).     . furosemide (LASIX) 20 MG tablet Take 20 mg by mouth daily.    Marland Kitchen loratadine (CLARITIN) 10 MG tablet Take 10 mg by mouth daily.    . mineral oil liquid Place into both ears every Monday. Instill 2 drops in both ears once daily every Monday for Cerumen Impaction.    . naproxen sodium (ALEVE) 220 MG tablet Take 220 mg by  mouth 2 (two) times daily as needed (Pain).    . polyethylene glycol (MIRALAX / GLYCOLAX) packet Take 17 g by mouth daily as needed for mild constipation.    . potassium chloride (K-DUR) 10 MEQ tablet Take 10 mEq by mouth daily.    . ramipril (ALTACE) 2.5 MG capsule Take 2.5 mg by mouth daily.    Marland Kitchen senna (SENOKOT) 8.6 MG TABS tablet Take 1 tablet by mouth at bedtime.    . vitamin B-12 (CYANOCOBALAMIN) 1000 MCG tablet Take 1,000 mcg by mouth daily.       Physical Exam(=30) Blood pressure 154/64, pulse 76, temperature 99.3 F (37.4 C), temperature source Tympanic, resp. rate 15, height 5' (1.524 m), weight 132 lb (59.875 kg), SpO2 97 %. Gen. Appearance normal  Peripheral vascular system edema Lymph nodes normal  Gait: not able to walk   Upper extremities  Inspection revealed no malalignment or asymmetry  Assessment of range of motion: Full range of motion was recorded  Assessment of stability: Elbow wrist and hand and shoulder were stable  Assessment of muscle strength and tone revealed grade 5 muscle strength and normal muscle tone  Skin was normal without rash lesion or ulceration  Lower extremities-right   Inspection revealed no malalignment or asymmetry  Assessment of range of motion: Full range of motion was recorded  Assessment of stability: Elbow wrist and hand and shoulder were stable  Assessment of muscle strength and tone revealed grade 5 muscle strength and normal muscle tone  Skin was normal without rash lesion or ulceration  Left  Normal alignment no shortening rom deferred 2nd to pain  Stability knee ankle normal  Muscle tone normal  Skin normal   Coordination was tested by finger-to-nose nose and was normal Deep tendon reflexes were 2+ in the upper extremities  Examination of sensation by touch was normal  Mental status  Oriented to time person and place normal  Mood and affect normal without depression anxiety or agitation  Dx: left femoral neck fracture    Data Reviewed  I reviewed the images and the reports and my independent interpretation is subcapital/non displaced left hip fracture    Assessment  Left femoral neck fracture nondisplaced stable  Plan   Closed reduction and open treatment internal fixation with cannulated screws left

## 2015-05-22 NOTE — ED Provider Notes (Signed)
CSN: [id number]     Arrival date & time 05/22/15  2010 History   First MD Initiated Contact with Patient 05/22/15 2054     Chief Complaint  Patient presents with  . Fall      HPI Pt was seen at 2100. Per pt, c/o sudden onset and resolution of one episode of slip and fall that occurred PTA. Pt c/o left hip pain since the fall. Has been unable to weight bear due to pain. Denies LOC/syncope, no neck or back pain, no abd pain, no CP/SOB, no tingling/numbness in extremities.    Past Medical History  Diagnosis Date  . Tremor   . Paralysis agitans ([place]) 09/20/2012  . Sciatica of left side   . Obese   . Diabetes mellitus without complication ([place])   . Dyslipidemia   . Migraine without aura, without mention of intractable migraine without mention of status migrainosus   . Hypertension   . Arthritis   . Cancer ([place])     basal cell-forehead  . Parkinson's disease [facility])    Past Surgical History  Procedure Laterality Date  . Appendectomy    . Basal cell carcinoma resection      Forehead  . Mouth surgery    . Tonsillectomy    . Arthroscopic surgery      Left knee  . Trigger finger surgery      Bilateral thumb  . Cataract extraction w/phaco Right 11/19/2013    Procedure: CATARACT EXTRACTION PHACO AND INTRAOCULAR LENS PLACEMENT (IOC);  Surgeon: Tonny Branch, MD;  Location: [facility];  Service: Ophthalmology;  Laterality: Right;  CDE:  12.95  . Cataract extraction w/phaco Left 12/17/2013    Procedure: CATARACT EXTRACTION PHACO AND INTRAOCULAR LENS PLACEMENT (IOC);  Surgeon: Tonny Branch, MD;  Location: [facility];  Service: Ophthalmology;  Laterality: Left;  CDE 12.10   Family History  Problem Relation Age of Onset  . Heart attack Father     not certain of COD  . Rheum arthritis Mother   . Hypertension Paternal Grandfather   . Stroke Paternal Grandfather   . Cirrhosis Paternal Grandmother    Social History  Substance Use Topics  . Smoking status: Never Smoker   . Smokeless tobacco: Never  Used  . Alcohol Use: No    Review of Systems ROS: Statement: All systems negative except as marked or noted in the HPI; Constitutional: Negative for fever and chills. ; ; Eyes: Negative for eye pain, redness and discharge. ; ; ENMT: Negative for ear pain, hoarseness, nasal congestion, sinus pressure and sore throat. ; ; Cardiovascular: Negative for chest pain, palpitations, diaphoresis, dyspnea and peripheral edema. ; ; Respiratory: Negative for cough, wheezing and stridor. ; ; Gastrointestinal: Negative for nausea, vomiting, diarrhea, abdominal pain, blood in stool, hematemesis, jaundice and rectal bleeding. . ; ; Genitourinary: Negative for dysuria, flank pain and hematuria. ; ; Musculoskeletal: Negative for back pain and neck pain. +left hip pain s/p fall.; ; Skin: Negative for pruritus, rash, abrasions, blisters, bruising and skin lesion.; ; Neuro: Negative for headache, lightheadedness and neck stiffness. Negative for weakness, altered level of consciousness , altered mental status, extremity weakness, paresthesias, involuntary movement, seizure and syncope.     Allergies  Sulfa antibiotics; Azilect; Fenofibrate; Adhesive; Sinemet; and Tomato  Home Medications   Prior to Admission medications   Medication Sig Start Date End Date Taking? Authorizing Provider  amantadine (SYMMETREL) 100 MG capsule Take 100 mg by mouth 3 (three) times daily.    Yes Historical Provider,  MD  aspirin 81 MG chewable tablet Chew 81 mg by mouth daily.   Yes Historical Provider, MD  benzonatate (TESSALON) 100 MG capsule Take 100 mg by mouth 2 (two) times daily. 2 week course for cough to be completed on 05/07/15   Yes Historical Provider, MD  dextromethorphan-guaiFENesin (ROBITUSSIN COLD COUGH+ CHEST) 10-100 MG/5ML liquid Take 20 mLs by mouth 3 (three) times daily. Reported on 04/30/2015   Yes Historical Provider, MD  diclofenac sodium (VOLTAREN) 1 % GEL Place 2 g onto the skin daily as needed (pain).  05/17/14  Yes  Historical Provider, MD  furosemide (LASIX) 20 MG tablet Take 20 mg by mouth daily.   Yes Historical Provider, MD  loratadine (CLARITIN) 10 MG tablet Take 10 mg by mouth daily.   Yes Historical Provider, MD  mineral oil liquid Place into both ears every Monday. Instill 2 drops in both ears once daily every Monday for Cerumen Impaction.   Yes Historical Provider, MD  naproxen sodium (ALEVE) 220 MG tablet Take 220 mg by mouth 2 (two) times daily as needed (Pain).   Yes Historical Provider, MD  polyethylene glycol (MIRALAX / GLYCOLAX) packet Take 17 g by mouth daily as needed for mild constipation.   Yes Historical Provider, MD  potassium chloride (K-DUR) 10 MEQ tablet Take 10 mEq by mouth daily.   Yes Historical Provider, MD  ramipril (ALTACE) 2.5 MG capsule Take 2.5 mg by mouth daily.   Yes Historical Provider, MD  senna (SENOKOT) 8.6 MG TABS tablet Take 1 tablet by mouth at bedtime.   Yes Historical Provider, MD  vitamin B-12 (CYANOCOBALAMIN) 1000 MCG tablet Take 1,000 mcg by mouth daily.   Yes Historical Provider, MD  azithromycin (ZITHROMAX) 250 MG tablet Take 250-500 mg by mouth See admin instructions. Reported on 04/30/2015    Historical Provider, MD   BP 154/64 mmHg  Pulse 76  Temp(Src) 99.3 F (37.4 C) (Tympanic)  Resp 15  Ht 5' (1.524 m)  Wt 132 lb (59.875 kg)  BMI 25.78 kg/m2  SpO2 97% Physical Exam  2105: Physical examination:  Nursing notes reviewed; Vital signs and O2 SAT reviewed;  Constitutional: Well developed, Well nourished, Well hydrated, In no acute distress; Head:  Normocephalic, atraumatic; Eyes: EOMI, PERRL, No scleral icterus; ENMT: Mouth and pharynx normal, Mucous membranes moist; Neck: Supple, Full range of motion, No lymphadenopathy; Cardiovascular: Regular rate and rhythm, No gallop; Respiratory: Breath sounds clear & equal bilaterally, No wheezes.  Speaking full sentences with ease, Normal respiratory effort/excursion; Chest: Nontender, Movement normal; Abdomen:  Soft, Nontender, Nondistended, Normal bowel sounds; Genitourinary: No CVA tenderness; Extremities: Pulses normal, +left hip tenderness to palp, LLE shortened. Pelvis stable. No edema, No calf edema or asymmetry.; Neuro: AA&Ox3, Major CN grossly intact.  Speech clear. No gross focal motor deficits in extremities. +tremor bilat UE's.; Skin: Color normal, Warm, Dry.   ED Course  Procedures (including critical care time) Labs Review  Imaging Review  I have personally reviewed and evaluated these images and lab results as part of my medical decision-making.   EKG Interpretation   Date/Time:  Thursday May 22 2015 21:17:15 EST Ventricular Rate:  186 PR Interval:  127 QRS Duration: 81 QT Interval:  203 QTC Calculation: 357 R Axis:   -14 Text Interpretation:  Poor data quality Normal sinus rhythm Low voltage  QRS Baseline wander Artifact Poor data quality in current ECG precludes  serial comparison Confirmed by [facility]  MD, Nunzio Cory [phone]) on 05/22/2015  11:02:04 PM  MDM  MDM Reviewed: previous chart, nursing note and vitals Reviewed previous: labs and ECG Interpretation: labs, ECG and x-ray      Results for orders placed or performed during the hospital encounter of 123456  Basic metabolic panel  Result Value Ref Range   Sodium 140 135 - 145 mmol/L   Potassium 4.1 3.5 - 5.1 mmol/L   Chloride 102 101 - 111 mmol/L   CO2 27 22 - 32 mmol/L   Glucose, Bld 143 (H) 65 - 99 mg/dL   BUN 22 (H) 6 - 20 mg/dL   Creatinine, Ser 0.88 0.44 - 1.00 mg/dL   Calcium 9.4 8.9 - 10.3 mg/dL   GFR calc non Af Amer 58 (L) >60 mL/min   GFR calc Af Amer >60 >60 mL/min   Anion gap 11 5 - 15  Troponin I  Result Value Ref Range   Troponin I <0.03 <0.031 ng/mL  CBC with Differential  Result Value Ref Range   WBC 13.9 (H) 4.0 - 10.5 K/uL   RBC 4.26 3.87 - 5.11 MIL/uL   Hemoglobin 13.6 12.0 - 15.0 g/dL   HCT 40.1 36.0 - 46.0 %   MCV 94.1 78.0 - 100.0 fL   MCH 31.9 26.0 - 34.0 pg   MCHC  33.9 30.0 - 36.0 g/dL   RDW 13.5 11.5 - 15.5 %   Platelets 173 150 - 400 K/uL   Neutrophils Relative % 78 %   Neutro Abs 10.9 (H) 1.7 - 7.7 K/uL   Lymphocytes Relative 11 %   Lymphs Abs 1.5 0.7 - 4.0 K/uL   Monocytes Relative 10 %   Monocytes Absolute 1.3 (H) 0.1 - 1.0 K/uL   Eosinophils Relative 1 %   Eosinophils Absolute 0.2 0.0 - 0.7 K/uL   Basophils Relative 0 %   Basophils Absolute 0.1 0.0 - 0.1 K/uL  Protime-INR  Result Value Ref Range   Prothrombin Time 15.1 11.6 - 15.2 seconds   INR 1.18 0.00 - 1.49  Urinalysis, Routine w reflex microscopic (not at [facility])  Result Value Ref Range   Color, Urine YELLOW YELLOW   APPearance CLEAR CLEAR   Specific Gravity, Urine 1.025 1.005 - 1.030   pH 6.0 5.0 - 8.0   Glucose, UA NEGATIVE NEGATIVE mg/dL   Hgb urine dipstick NEGATIVE NEGATIVE   Bilirubin Urine NEGATIVE NEGATIVE   Ketones, ur NEGATIVE NEGATIVE mg/dL   Protein, ur NEGATIVE NEGATIVE mg/dL   Nitrite NEGATIVE NEGATIVE   Leukocytes, UA NEGATIVE NEGATIVE   Dg Chest 1 View 05/22/2015  CLINICAL DATA:  Preoperative examination for patient with a left hip fracture. Initial encounter. EXAM: CHEST 1 VIEW COMPARISON:  None. FINDINGS: There is some coarsening of the pulmonary interstitium. No consolidative process, pneumothorax or effusion. Punctate granuloma right lower lobe noted. Heart size is normal. Aortic atherosclerosis is seen. IMPRESSION: No acute disease. Electronically Signed   By: Inge Rise M.D.   On: 05/22/2015 21:10   Dg Hip Unilat With Pelvis 2-3 Views Left 05/22/2015  CLINICAL DATA:  Status post fall today. Severe left hip pain. Initial encounter. EXAM: DG HIP (WITH OR WITHOUT PELVIS) 2-3V LEFT COMPARISON:  Single view of the pelvis 04/01/2015. FINDINGS: The patient has an acute mildly impacted subcapital fracture of the left hip. No other acute bony or joint abnormality is identified. IMPRESSION: Acute subcapital fracture left hip. Electronically Signed   By: Inge Rise M.D.   On: 05/22/2015 20:55    2220:  Dx and testing d/w pt and  family.  Questions answered.  Verb understanding, agreeable to admit.   T/C to Ortho Dr. Aline Brochure, case discussed, including:  HPI, pertinent PM/SHx, VS/PE, dx testing, ED course and treatment:  Agreeable to consult. T/C to [facility] Dr. Darrick Meigs, case discussed, including:  HPI, pertinent PM/SHx, VS/PE, dx testing, ED course and treatment:  Agreeable to admit, requests to write temporary orders, obtain medical bed to team [facility].    Francine Graven, DO 05/24/15 [phone]

## 2015-05-22 NOTE — ED Notes (Signed)
Pt from [facility], fell in her room early today. Left hip pain. Pt assisted out of the car, unable to bear weight

## 2015-05-22 NOTE — H&P (Signed)
PCP:   Chesley Noon, MD   Chief Complaint:  Fall  HPI:  82 year old female who  has a past medical history of Tremor; Paralysis agitans ([place]) (09/20/2012); Sciatica of left side; Obese; Diabetes mellitus without complication ([place]); Dyslipidemia; Migraine without aura, without mention of intractable migraine without mention of status migrainosus; Hypertension; Arthritis; Cancer [facility]); and Parkinson's disease ([place]). Today presents to the hospital after patient fell earlier today, and complained of left hip pain. Patient was unable to bear weight. X-ray in the ED showed acute subcapital fracture left hip. Patient denies passing out. Denies chest pain or shortness of breath. No nausea vomiting or diarrhea. She does have a history of Parkinson disease. Denies cardiac problems.  Allergies:   Allergies  Allergen Reactions  . Sulfa Antibiotics Rash  . Azilect [Rasagiline] Rash  . Fenofibrate Rash  . Adhesive [Tape] Rash and Other (See Comments)    Redness  . Sinemet [Carbidopa-Levodopa] Rash  . Tomato Rash      Past Medical History  Diagnosis Date  . Tremor   . Paralysis agitans ([place]) 09/20/2012  . Sciatica of left side   . Obese   . Diabetes mellitus without complication ([place])   . Dyslipidemia   . Migraine without aura, without mention of intractable migraine without mention of status migrainosus   . Hypertension   . Arthritis   . Cancer ([place])     basal cell-forehead  . Parkinson's disease [facility])     Past Surgical History  Procedure Laterality Date  . Appendectomy    . Basal cell carcinoma resection      Forehead  . Mouth surgery    . Tonsillectomy    . Arthroscopic surgery      Left knee  . Trigger finger surgery      Bilateral thumb  . Cataract extraction w/phaco Right 11/19/2013    Procedure: CATARACT EXTRACTION PHACO AND INTRAOCULAR LENS PLACEMENT (IOC);  Surgeon: Tonny Branch, MD;  Location: [facility];  Service: Ophthalmology;  Laterality: Right;  CDE:  12.95  .  Cataract extraction w/phaco Left 12/17/2013    Procedure: CATARACT EXTRACTION PHACO AND INTRAOCULAR LENS PLACEMENT (IOC);  Surgeon: Tonny Branch, MD;  Location: [facility];  Service: Ophthalmology;  Laterality: Left;  CDE 12.10    Prior to Admission medications   Medication Sig Start Date End Date Taking? Authorizing Provider  amantadine (SYMMETREL) 100 MG capsule Take 100 mg by mouth 3 (three) times daily.    Yes Historical Provider, MD  aspirin 81 MG chewable tablet Chew 81 mg by mouth daily.   Yes Historical Provider, MD  benzonatate (TESSALON) 100 MG capsule Take 100 mg by mouth 2 (two) times daily. 2 week course for cough to be completed on 05/07/15   Yes Historical Provider, MD  dextromethorphan-guaiFENesin (ROBITUSSIN COLD COUGH+ CHEST) 10-100 MG/5ML liquid Take 20 mLs by mouth 3 (three) times daily. Reported on 04/30/2015   Yes Historical Provider, MD  diclofenac sodium (VOLTAREN) 1 % GEL Place 2 g onto the skin daily as needed (pain).  05/17/14  Yes Historical Provider, MD  furosemide (LASIX) 20 MG tablet Take 20 mg by mouth daily.   Yes Historical Provider, MD  loratadine (CLARITIN) 10 MG tablet Take 10 mg by mouth daily.   Yes Historical Provider, MD  mineral oil liquid Place into both ears every Monday. Instill 2 drops in both ears once daily every Monday for Cerumen Impaction.   Yes Historical Provider, MD  naproxen sodium (ALEVE) 220 MG tablet Take  220 mg by mouth 2 (two) times daily as needed (Pain).   Yes Historical Provider, MD  polyethylene glycol (MIRALAX / GLYCOLAX) packet Take 17 g by mouth daily as needed for mild constipation.   Yes Historical Provider, MD  potassium chloride (K-DUR) 10 MEQ tablet Take 10 mEq by mouth daily.   Yes Historical Provider, MD  ramipril (ALTACE) 2.5 MG capsule Take 2.5 mg by mouth daily.   Yes Historical Provider, MD  senna (SENOKOT) 8.6 MG TABS tablet Take 1 tablet by mouth at bedtime.   Yes Historical Provider, MD  vitamin B-12 (CYANOCOBALAMIN) 1000 MCG  tablet Take 1,000 mcg by mouth daily.   Yes Historical Provider, MD  azithromycin (ZITHROMAX) 250 MG tablet Take 250-500 mg by mouth See admin instructions. Reported on 04/30/2015    Historical Provider, MD    Social History:  reports that she has never smoked. She has never used smokeless tobacco. She reports that she does not drink alcohol or use illicit drugs.  Family History  Problem Relation Age of Onset  . Heart attack Father     not certain of COD  . Rheum arthritis Mother   . Hypertension Paternal Grandfather   . Stroke Paternal Grandfather   . Cirrhosis Paternal [place] [employer]   05/22/15 2023  Weight: 59.875 kg (132 lb)    All the positives are listed in BOLD  Review of Systems:  HEENT: Headache, blurred vision, runny nose, sore throat Neck: Hypothyroidism, hyperthyroidism,,lymphadenopathy Chest : Shortness of breath, history of COPD, Asthma Heart : Chest pain, history of coronary arterey disease GI:  Nausea, vomiting, diarrhea, constipation, GERD GU: Dysuria, urgency, frequency of urination, hematuria Neuro: Stroke, seizures, syncope, Parkinson disease Psych: Depression, anxiety, hallucinations   Physical Exam: Blood pressure 154/64, pulse 76, temperature 99.3 F (37.4 C), temperature source Tympanic, resp. rate 15, height 5' (1.524 m), weight 59.875 kg (132 lb), SpO2 97 %. Constitutional:   Patient is a well-developed and well-nourished female in no acute distress and cooperative with exam. Head: Normocephalic and atraumatic Mouth: Mucus membranes moist Eyes: PERRL, EOMI, conjunctivae normal Neck: Supple, No Thyromegaly Cardiovascular: RRR, S1 normal, S2 normal Pulmonary/Chest: CTAB, no wheezes, rales, or rhonchi Abdominal: Soft. Non-tender, non-distended, bowel sounds are normal, no masses, organomegaly, or guarding present.  Neurological: A&O x3, Strength is normal and symmetric bilaterally, cranial nerve II-XII are grossly intact, no focal  motor deficit, sensory intact to light touch bilaterally.  Extremities : Tenderness at left hip  Labs on Admission:  Basic Metabolic Panel:  Recent Labs Lab 05/22/15 2130  NA 140  K 4.1  CL 102  CO2 27  GLUCOSE 143*  BUN 22*  CREATININE 0.88  CALCIUM 9.4     Recent Labs Lab 05/22/15 2130  WBC 13.9*  NEUTROABS 10.9*  HGB 13.6  HCT 40.1  MCV 94.1  PLT 173   Cardiac Enzymes:  Recent Labs Lab 05/22/15 2130  TROPONINI <0.03     Radiological Exams on Admission: Dg Chest 1 View  05/22/2015  CLINICAL DATA:  Preoperative examination for patient with a left hip fracture. Initial encounter. EXAM: CHEST 1 VIEW COMPARISON:  None. FINDINGS: There is some coarsening of the pulmonary interstitium. No consolidative process, pneumothorax or effusion. Punctate granuloma right lower lobe noted. Heart size is normal. Aortic atherosclerosis is seen. IMPRESSION: No acute disease. Electronically Signed   By: Inge Rise M.D.   On: 05/22/2015 21:10   Dg Hip Unilat With Pelvis 2-3 Views Left  05/22/2015  CLINICAL DATA:  Status post fall today. Severe left hip pain. Initial encounter. EXAM: DG HIP (WITH OR WITHOUT PELVIS) 2-3V LEFT COMPARISON:  Single view of the pelvis 04/01/2015. FINDINGS: The patient has an acute mildly impacted subcapital fracture of the left hip. No other acute bony or joint abnormality is identified. IMPRESSION: Acute subcapital fracture left hip. Electronically Signed   By: Inge Rise M.D.   On: 05/22/2015 20:55    EKG: Independently reviewed. Sinus rhythm   Assessment/Plan Active Problems:   Hip fracture (HCC)  Hip fracture Patient has acute subcapital fracture of left hip. Orthopedic surgery Dr. Aline Brochure was consulted by the ED physician. Will admit the patient and initiate hip fracture protocol.  Parkinson disease Continue amantadine 100 mg by mouth 3 times a day  DVT prophylaxis Heparin  Code status: DO NOT RESUSCITATE  Family discussion:  Admission, patients condition and plan of care including tests being ordered have been discussed with the patient and her husband at bedside who indicate understanding and agree with the plan and Code Status.   Time Spent on Admission: 60 min  [place] [facility] Pager: [phone] 05/22/2015, 10:56 PM  If 7PM-7AM, please contact night-coverage  [email or web address]  Password [email or web address]

## 2015-05-23 ENCOUNTER — Encounter (HOSPITAL_COMMUNITY)
Admission: EM | Disposition: A | Discharge status: Skilled Nursing Facility | Payer: Self-pay | Source: Home / Self Care | Attending: Internal Medicine

## 2015-05-23 ENCOUNTER — Inpatient Hospital Stay (HOSPITAL_COMMUNITY): Payer: Medicare Other | Admitting: Anesthesiology

## 2015-05-23 ENCOUNTER — Inpatient Hospital Stay (HOSPITAL_COMMUNITY): Payer: Medicare Other

## 2015-05-23 ENCOUNTER — Encounter (HOSPITAL_COMMUNITY): Payer: Self-pay | Admitting: *Deleted

## 2015-05-23 DIAGNOSIS — I10 Essential (primary) hypertension: Secondary | ICD-10-CM

## 2015-05-23 DIAGNOSIS — G20 Parkinson's disease: Secondary | ICD-10-CM

## 2015-05-23 DIAGNOSIS — S72002D Fracture of unspecified part of neck of left femur, subsequent encounter for closed fracture with routine healing: Secondary | ICD-10-CM

## 2015-05-23 DIAGNOSIS — S72002A Fracture of unspecified part of neck of left femur, initial encounter for closed fracture: Secondary | ICD-10-CM | POA: Insufficient documentation

## 2015-05-23 HISTORY — PX: HIP PINNING,CANNULATED: SHX1758

## 2015-05-23 LAB — SURGICAL PCR SCREEN
MRSA, PCR: NEGATIVE
Staphylococcus aureus: NEGATIVE

## 2015-05-23 LAB — CBC
HCT: 34.9 % — ABNORMAL LOW (ref 36.0–46.0)
Hemoglobin: 11.8 g/dL — ABNORMAL LOW (ref 12.0–15.0)
MCH: 31.8 pg (ref 26.0–34.0)
MCHC: 33.8 g/dL (ref 30.0–36.0)
MCV: 94.1 fL (ref 78.0–100.0)
PLATELETS: 149 10*3/uL — AB (ref 150–400)
RBC: 3.71 MIL/uL — ABNORMAL LOW (ref 3.87–5.11)
RDW: 13.4 % (ref 11.5–15.5)
WBC: 6.4 10*3/uL (ref 4.0–10.5)

## 2015-05-23 LAB — BASIC METABOLIC PANEL
Anion gap: 5 (ref 5–15)
BUN: 16 mg/dL (ref 6–20)
CHLORIDE: 108 mmol/L (ref 101–111)
CO2: 28 mmol/L (ref 22–32)
CREATININE: 0.7 mg/dL (ref 0.44–1.00)
Calcium: 8.8 mg/dL — ABNORMAL LOW (ref 8.9–10.3)
GFR calc Af Amer: >60 mL/min (ref >60)
GFR calc non Af Amer: >60 mL/min (ref >60)
Glucose, Bld: 109 mg/dL — ABNORMAL HIGH (ref 65–99)
Potassium: 3.9 mmol/L (ref 3.5–5.1)
Sodium: 141 mmol/L (ref 135–145)

## 2015-05-23 LAB — GLUCOSE, CAPILLARY: Glucose-Capillary: 84 mg/dL (ref 65–99)

## 2015-05-23 LAB — ABO/RH: ABO/RH(D): B POS

## 2015-05-23 SURGERY — FIXATION, FEMUR, NECK, PERCUTANEOUS, USING SCREW
Anesthesia: Spinal | Site: Hip | Laterality: Left

## 2015-05-23 MED ORDER — MORPHINE SULFATE (PF) 2 MG/ML IV SOLN: 0.5000 mg | INTRAVENOUS | Status: DC | PRN

## 2015-05-23 MED ORDER — BUPIVACAINE-EPINEPHRINE (PF) 0.5% -1:200000 IJ SOLN
INTRAMUSCULAR | Status: AC
  Filled 2015-05-23: qty 60

## 2015-05-23 MED ORDER — FENTANYL CITRATE (PF) 100 MCG/2ML IJ SOLN
INTRAMUSCULAR | Status: AC
  Filled 2015-05-23: qty 2

## 2015-05-23 MED ORDER — BUPIVACAINE-EPINEPHRINE (PF) 0.5% -1:200000 IJ SOLN
INTRAMUSCULAR | Status: DC | PRN
  Administered 2015-05-23: 60 mL via PERINEURAL

## 2015-05-23 MED ORDER — ASPIRIN EC 325 MG PO TBEC
325.0000 mg | DELAYED_RELEASE_TABLET | Freq: Every day | ORAL | Status: DC
  Administered 2015-05-24 – 2015-05-26 (×3): 325 mg via ORAL
  Filled 2015-05-23 (×3): qty 1

## 2015-05-23 MED ORDER — CEFAZOLIN SODIUM-DEXTROSE 2-3 GM-% IV SOLR
INTRAVENOUS | Status: AC
  Filled 2015-05-23: qty 100

## 2015-05-23 MED ORDER — LACTATED RINGERS IV SOLN: INTRAVENOUS | Status: DC

## 2015-05-23 MED ORDER — HYDROCODONE-ACETAMINOPHEN 5-325 MG PO TABS
1.0000 | ORAL_TABLET | Freq: Four times a day (QID) | ORAL | Status: DC | PRN
  Administered 2015-05-23 – 2015-05-25 (×3): 1 via ORAL
  Filled 2015-05-23 (×3): qty 1

## 2015-05-23 MED ORDER — ACETAMINOPHEN 650 MG RE SUPP: 650.0000 mg | Freq: Four times a day (QID) | RECTAL | Status: DC | PRN

## 2015-05-23 MED ORDER — LACTATED RINGERS IV SOLN
INTRAVENOUS | Status: DC
  Administered 2015-05-23: 14:00:00 via INTRAVENOUS

## 2015-05-23 MED ORDER — MIDAZOLAM HCL 2 MG/2ML IJ SOLN
INTRAMUSCULAR | Status: AC
  Filled 2015-05-23: qty 2

## 2015-05-23 MED ORDER — MAGNESIUM CITRATE PO SOLN: 1.0000 | Freq: Once | ORAL | Status: DC | PRN

## 2015-05-23 MED ORDER — ONDANSETRON HCL 4 MG/2ML IJ SOLN: 4.0000 mg | Freq: Once | INTRAMUSCULAR | Status: DC | PRN

## 2015-05-23 MED ORDER — BUPIVACAINE IN DEXTROSE 0.75-8.25 % IT SOLN
INTRATHECAL | Status: AC
  Filled 2015-05-23: qty 2

## 2015-05-23 MED ORDER — LORATADINE 10 MG PO TABS
10.0000 mg | ORAL_TABLET | Freq: Every day | ORAL | Status: DC
  Administered 2015-05-23 – 2015-05-26 (×4): 10 mg via ORAL
  Filled 2015-05-23 (×4): qty 1

## 2015-05-23 MED ORDER — MIDAZOLAM HCL 2 MG/2ML IJ SOLN: 1.0000 mg | INTRAMUSCULAR | Status: DC | PRN

## 2015-05-23 MED ORDER — FENTANYL CITRATE (PF) 100 MCG/2ML IJ SOLN: 25.0000 ug | INTRAMUSCULAR | Status: DC | PRN

## 2015-05-23 MED ORDER — HEPARIN SODIUM (PORCINE) 5000 UNIT/ML IJ SOLN: 5000.0000 [IU] | Freq: Three times a day (TID) | INTRAMUSCULAR | Status: DC

## 2015-05-23 MED ORDER — MIDAZOLAM HCL 5 MG/5ML IJ SOLN
INTRAMUSCULAR | Status: DC | PRN
  Administered 2015-05-23: 0.5 mg via INTRAVENOUS

## 2015-05-23 MED ORDER — CHLORHEXIDINE GLUCONATE 4 % EX LIQD
60.0000 mL | Freq: Once | CUTANEOUS | Status: DC
  Filled 2015-05-23: qty 15

## 2015-05-23 MED ORDER — BUPIVACAINE IN DEXTROSE 0.75-8.25 % IT SOLN
INTRATHECAL | Status: DC | PRN
  Administered 2015-05-23: 15 mg via INTRATHECAL

## 2015-05-23 MED ORDER — ONDANSETRON HCL 4 MG PO TABS: 4.0000 mg | ORAL_TABLET | Freq: Four times a day (QID) | ORAL | Status: DC | PRN

## 2015-05-23 MED ORDER — ACETAMINOPHEN 325 MG PO TABS: 650.0000 mg | ORAL_TABLET | Freq: Four times a day (QID) | ORAL | Status: DC | PRN

## 2015-05-23 MED ORDER — SODIUM CHLORIDE 0.9 % IR SOLN
Status: DC | PRN
  Administered 2015-05-23: 1000 mL

## 2015-05-23 MED ORDER — RAMIPRIL 1.25 MG PO CAPS
2.5000 mg | ORAL_CAPSULE | Freq: Every day | ORAL | Status: DC
  Administered 2015-05-23 – 2015-05-26 (×4): 2.5 mg via ORAL
  Filled 2015-05-23 (×4): qty 2

## 2015-05-23 MED ORDER — CEFAZOLIN SODIUM-DEXTROSE 2-3 GM-% IV SOLR
2.0000 g | INTRAVENOUS | Status: DC
  Administered 2015-05-23: 2 g via INTRAVENOUS
  Filled 2015-05-23 (×2): qty 50

## 2015-05-23 MED ORDER — POLYETHYLENE GLYCOL 3350 17 G PO PACK: 17.0000 g | PACK | Freq: Every day | ORAL | Status: DC | PRN

## 2015-05-23 MED ORDER — DOCUSATE SODIUM 100 MG PO CAPS
100.0000 mg | ORAL_CAPSULE | Freq: Two times a day (BID) | ORAL | Status: DC
  Administered 2015-05-23 – 2015-05-26 (×6): 100 mg via ORAL
  Filled 2015-05-23 (×6): qty 1

## 2015-05-23 MED ORDER — CEFAZOLIN SODIUM-DEXTROSE 2-3 GM-% IV SOLR
2.0000 g | Freq: Four times a day (QID) | INTRAVENOUS | Status: AC
  Administered 2015-05-23 – 2015-05-24 (×2): 2 g via INTRAVENOUS
  Filled 2015-05-23 (×2): qty 50

## 2015-05-23 MED ORDER — EPHEDRINE SULFATE 50 MG/ML IJ SOLN
INTRAMUSCULAR | Status: DC | PRN
  Administered 2015-05-23: 10 mg via INTRAVENOUS

## 2015-05-23 MED ORDER — MENTHOL 3 MG MT LOZG: 1.0000 | LOZENGE | OROMUCOSAL | Status: DC | PRN

## 2015-05-23 MED ORDER — METOCLOPRAMIDE HCL 10 MG PO TABS: 5.0000 mg | ORAL_TABLET | Freq: Three times a day (TID) | ORAL | Status: DC | PRN

## 2015-05-23 MED ORDER — SORBITOL 70 % SOLN
30.0000 mL | Freq: Every day | Status: DC | PRN
Start: 2015-05-23 — End: 2015-05-26

## 2015-05-23 MED ORDER — MIDAZOLAM HCL 2 MG/2ML IJ SOLN
1.0000 mg | INTRAMUSCULAR | Status: DC | PRN
Start: 2015-05-23 — End: 2015-05-23
  Administered 2015-05-23: 2 mg via INTRAVENOUS

## 2015-05-23 MED ORDER — AMANTADINE HCL 100 MG PO CAPS
100.0000 mg | ORAL_CAPSULE | Freq: Three times a day (TID) | ORAL | Status: DC
  Administered 2015-05-24 – 2015-05-26 (×7): 100 mg via ORAL
  Filled 2015-05-23 (×16): qty 1

## 2015-05-23 MED ORDER — SENNA 8.6 MG PO TABS
1.0000 | ORAL_TABLET | Freq: Every day | ORAL | Status: DC
Start: 2015-05-23 — End: 2015-05-26
  Administered 2015-05-23 – 2015-05-25 (×3): 8.6 mg via ORAL
  Filled 2015-05-23 (×4): qty 1

## 2015-05-23 MED ORDER — FENTANYL CITRATE (PF) 100 MCG/2ML IJ SOLN
INTRAMUSCULAR | Status: DC | PRN
  Administered 2015-05-23: 25 ug via INTRATHECAL
  Administered 2015-05-23: 25 ug via INTRAVENOUS

## 2015-05-23 MED ORDER — PROPOFOL 500 MG/50ML IV EMUL
INTRAVENOUS | Status: DC | PRN
  Administered 2015-05-23: 25 ug/kg/min via INTRAVENOUS

## 2015-05-23 MED ORDER — ONDANSETRON HCL 4 MG/2ML IJ SOLN
4.0000 mg | Freq: Four times a day (QID) | INTRAMUSCULAR | Status: DC | PRN
  Administered 2015-05-23: 4 mg via INTRAVENOUS
  Filled 2015-05-23: qty 2

## 2015-05-23 MED ORDER — PHENOL 1.4 % MT LIQD: 1.0000 | OROMUCOSAL | Status: DC | PRN

## 2015-05-23 MED ORDER — METOCLOPRAMIDE HCL 5 MG/ML IJ SOLN: 5.0000 mg | Freq: Three times a day (TID) | INTRAMUSCULAR | Status: DC | PRN

## 2015-05-23 SURGICAL SUPPLY — 54 items
BAG HAMPER (MISCELLANEOUS) ×3 IMPLANT
BIT DRILL 5 ACE CANN QC (BIT) ×3 IMPLANT
BLADE HEX COATED 2.75 (ELECTRODE) ×3 IMPLANT
BNDG GAUZE ELAST 4 BULKY (GAUZE/BANDAGES/DRESSINGS) ×3 IMPLANT
CHLORAPREP W/TINT 26ML (MISCELLANEOUS) ×3 IMPLANT
CLOTH BEACON ORANGE TIMEOUT ST (SAFETY) ×3 IMPLANT
COVER LIGHT HANDLE STERIS (MISCELLANEOUS) ×12 IMPLANT
COVER MAYO STAND XLG (DRAPE) IMPLANT
DECANTER SPIKE VIAL GLASS SM (MISCELLANEOUS) ×6 IMPLANT
DRAPE STERI IOBAN 125X83 (DRAPES) ×3 IMPLANT
DRESSING MEPILEX BORDER 6X8 (GAUZE/BANDAGES/DRESSINGS) IMPLANT
DRSG AQUACEL AG ADV 3.5X10 (GAUZE/BANDAGES/DRESSINGS) ×3 IMPLANT
DRSG MEPILEX BORDER 6X8 (GAUZE/BANDAGES/DRESSINGS)
DRSG TEGADERM 4X4.75 (GAUZE/BANDAGES/DRESSINGS) IMPLANT
GLOVE BIOGEL PI IND STRL 7.0 (GLOVE) ×4 IMPLANT
GLOVE BIOGEL PI INDICATOR 7.0 (GLOVE) ×8
GLOVE ECLIPSE 6.5 STRL STRAW (GLOVE) ×6 IMPLANT
GLOVE EXAM NITRILE MD LF STRL (GLOVE) ×3 IMPLANT
GLOVE SKINSENSE NS SZ8.0 LF (GLOVE) ×2
GLOVE SKINSENSE STRL SZ8.0 LF (GLOVE) ×1 IMPLANT
GLOVE SS N UNI LF 8.5 STRL (GLOVE) ×3 IMPLANT
GOWN STRL REUS W/TWL LRG LVL3 (GOWN DISPOSABLE) ×6 IMPLANT
GOWN STRL REUS W/TWL XL LVL3 (GOWN DISPOSABLE) ×3 IMPLANT
INST SET MAJOR BONE (KITS) ×3 IMPLANT
KIT BLADEGUARD II DBL (SET/KITS/TRAYS/PACK) ×3 IMPLANT
KIT ROOM TURNOVER APOR (KITS) ×3 IMPLANT
MANIFOLD NEPTUNE II (INSTRUMENTS) ×3 IMPLANT
MARKER SKIN DUAL TIP RULER LAB (MISCELLANEOUS) ×3 IMPLANT
NEEDLE HYPO 21X1.5 SAFETY (NEEDLE) ×3 IMPLANT
NEEDLE SPNL 18GX3.5 QUINCKE PK (NEEDLE) ×3 IMPLANT
NS IRRIG 1000ML POUR BTL (IV SOLUTION) ×3 IMPLANT
PACK BASIC III (CUSTOM PROCEDURE TRAY) ×2
PACK SRG BSC III STRL LF ECLPS (CUSTOM PROCEDURE TRAY) ×1 IMPLANT
PAD ABD 5X9 TENDERSORB (GAUZE/BANDAGES/DRESSINGS) IMPLANT
PENCIL HANDSWITCHING (ELECTRODE) ×3 IMPLANT
PIN THREADED GUIDE ACE (PIN) ×9 IMPLANT
SCREW CANN 6.5 75MM (Screw) ×2 IMPLANT
SCREW CANN 6.5 80MM (Screw) ×4 IMPLANT
SCREW CANN 6.5 85MM (Screw) ×2 IMPLANT
SCREW CANN LG 6.5 FLT 75X22 (Screw) ×1 IMPLANT
SCREW CANN LG 6.5 FLT 80X22 (Screw) ×2 IMPLANT
SCREW CANN LG 6.5 FLT 85X22 (Screw) ×1 IMPLANT
SET BASIN LINEN APH (SET/KITS/TRAYS/PACK) ×3 IMPLANT
SLING ARM XL FOAM STRAP (SOFTGOODS) ×3 IMPLANT
SPONGE LAP 18X18 X RAY DECT (DISPOSABLE) ×6 IMPLANT
STAPLER VISISTAT 35W (STAPLE) ×3 IMPLANT
SUT BRALON NAB BRD #1 30IN (SUTURE) IMPLANT
SUT MNCRL 0 VIOLET CTX 36 (SUTURE) ×1 IMPLANT
SUT MON AB 2-0 CT1 36 (SUTURE) ×3 IMPLANT
SUT MONOCRYL 0 CTX 36 (SUTURE) ×2
SYR 30ML LL (SYRINGE) ×3 IMPLANT
SYR BULB IRRIGATION 50ML (SYRINGE) ×6 IMPLANT
TOWEL OR 17X26 4PK STRL BLUE (TOWEL DISPOSABLE) ×3 IMPLANT
YANKAUER SUCT BULB TIP 10FT TU (MISCELLANEOUS) ×3 IMPLANT

## 2015-05-23 NOTE — Care Management Important Message (Signed)
Important Message  Patient Details  Name: Catherine [name] MRN: [id number] Date of Birth: 10-02-1927   Medicare Important Message Given:  Yes    Alvie Heidelberg, RN 05/23/2015, 10:09 AM

## 2015-05-23 NOTE — Anesthesia Procedure Notes (Signed)
Spinal Patient location during procedure: OR Start time: 05/23/2015 3:12 PM Staffing Resident/CRNA: Bernette Redbird J Preanesthetic Checklist Completed: patient identified, site marked, surgical consent, pre-op evaluation, timeout performed, IV checked, risks and benefits discussed and monitors and equipment checked Spinal Block Patient position: left lateral decubitus Prep: Betadine Patient monitoring: heart rate, cardiac monitor, continuous pulse ox and blood pressure Approach: left paramedian Location: L3-4 Injection technique: single-shot Needle Needle type: Spinocan  Needle gauge: 22 G Assessment Sensory level: T6 Additional Notes CSF slow and clear      [id number]        04/2016

## 2015-05-23 NOTE — Brief Op Note (Signed)
05/22/2015 - 05/23/2015  4:38 PM  PATIENT:  Catherine [name]  81 y.o. female  PRE-OPERATIVE DIAGNOSIS:  left femoral neck fracture  POST-OPERATIVE DIAGNOSIS:  Same   PROCEDURE:  Procedure(s): INTERNAL FIXATION LEFT HIP (Left)   The operative findings were nondisplaced subcapital fracture left hip  The implants with 3 titanium partially-threaded cannulated screws  SURGEON:  Surgeon(s) and Role:    * Carole Civil, MD - Primary  PHYSICIAN ASSISTANT:   ASSISTANTS: BETTY ASHLEY    ANESTHESIA:   spinal  EBL:  Total I/O In: 300 [I.V.:300] Out: 150 [Urine:150]  BLOOD ADMINISTERED:none  DRAINS: none   LOCAL MEDICATIONS USED:  MARCAINE     SPECIMEN:  No Specimen  DISPOSITION OF SPECIMEN:  N/A  COUNTS:  YES  TOURNIQUET:  * No tourniquets in log *  DICTATION: .Dragon Dictation  PLAN OF CARE: Admit to inpatient   PATIENT DISPOSITION:  PACU - hemodynamically stable.   Delay start of Pharmacological VTE agent (>24hrs) due to surgical blood loss or risk of bleeding: yes

## 2015-05-23 NOTE — NC FL2 (Addendum)
[place] LEVEL OF CARE SCREENING TOOL     IDENTIFICATION  Patient Name: Catherine [name] Birthdate: [birth year] Sex: female Admission Date (Current Location): 05/22/2015  [facility] and [place] Number:  [employer] and Address:  [place] [address], [place]      Provider Number: [id number]  Attending Physician Name and Address:  Annita Brod, MD  Relative Name and Phone Number:       Current Level of Care: Hospital Recommended Level of Care: [place] Prior Approval Number:    Date Approved/Denied:   PASRR Number: [id number]  Discharge Plan: SNF    Current Diagnoses: Patient Active Problem List   Diagnosis Date Noted  . Hip fracture ([place]) 05/22/2015  . Weakness 12/22/2014  . Ataxia 12/22/2014  . PAT (paroxysmal atrial tachycardia) ([place]) 01/25/2013  . Palpitations 01/11/2013  . Paralysis agitans ([place]) 09/20/2012  . Type 2 diabetes mellitus ([place]) 07/21/2011  . Hyperlipidemia 07/21/2011    Orientation RESPIRATION BLADDER Height & Weight    Self, Time, Situation, Place  O2 (3L) Continent 5' (152.4 cm) 134 lbs.  BEHAVIORAL SYMPTOMS/MOOD NEUROLOGICAL BOWEL NUTRITION STATUS  Other (Comment) (n/a)  (n/a) Continent Diet (Low sodium heart healthy)  AMBULATORY STATUS COMMUNICATION OF NEEDS Skin   Extensive Assist Verbally Surgical wounds                       Personal Care Assistance Level of Assistance  Bathing, Feeding, Dressing Bathing Assistance: Maximum assistance Feeding assistance: Limited assistance Dressing Assistance: Maximum assistance     Functional Limitations Info  Sight, Hearing, Speech Sight Info: Adequate Hearing Info: Impaired Speech Info: Adequate    SPECIAL CARE FACTORS FREQUENCY  PT (By licensed PT)     PT Frequency: 5              Contractures      Additional Factors Info  Code Status, Allergies Code Status Info: DNR Allergies Info: Sulfa  Antibiotics, Azilect, Fenofibrate, Adhesive, Sinemet, Tomato           Current Medications (05/23/2015):  This is the current hospital active medication list Current Facility-Administered Medications  Medication Dose Route Frequency Provider Last Rate Last Dose  . 0.9 %  sodium chloride infusion   Intravenous Continuous Francine Graven, DO 75 mL/hr at 05/22/15 2140    . [MAR Hold] amantadine (SYMMETREL) capsule 100 mg  100 mg Oral TID Oswald Hillock, MD   100 mg at 05/23/15 1000  . ceFAZolin (ANCEF) IVPB 2 g/50 mL premix  2 g Intravenous On Call to Perry Hall, MD      . chlorhexidine (HIBICLENS) 4 % liquid 4 application  60 mL Topical Once Carole Civil, MD      . Doug Sou Hold] heparin injection 5,000 Units  5,000 Units Subcutaneous 3 times per day Oswald Hillock, MD      . Doug Sou Hold] HYDROcodone-acetaminophen (NORCO/VICODIN) 5-325 MG per tablet 1-2 tablet  1-2 tablet Oral Q6H PRN Oswald Hillock, MD      . lactated ringers infusion   Intravenous Continuous Rusty Aus, MD 75 mL/hr at 05/23/15 1337    . [MAR Hold] loratadine (CLARITIN) tablet 10 mg  10 mg Oral Daily Oswald Hillock, MD      . midazolam (VERSED) injection 1-2 mg  1-2 mg Intravenous Q5 min PRN Rusty Aus, MD   2 mg at 05/23/15 1340  . [facility]  Hold] morphine 2 MG/ML injection 0.5 mg  0.5 mg Intravenous Q2H PRN Oswald Hillock, MD      . Doug Sou Hold] polyethylene glycol (MIRALAX / GLYCOLAX) packet 17 g  17 g Oral Daily PRN Oswald Hillock, MD      . Doug Sou Hold] ramipril (ALTACE) capsule 2.5 mg  2.5 mg Oral Daily Oswald Hillock, MD      . Doug Sou Hold] senna (SENOKOT) tablet 8.6 mg  1 tablet Oral QHS Oswald Hillock, MD         Discharge Medications: Please see discharge summary for a list of discharge medications.  Relevant Imaging Results:  Relevant Lab Results:   Additional Information From ALF. SS#: [id number]  Salome Arnt, [place]

## 2015-05-23 NOTE — Anesthesia Preprocedure Evaluation (Signed)
Anesthesia Evaluation  Patient identified by MRN, date of birth, ID band Patient awake    Reviewed: Allergy & Precautions, NPO status , Patient's Chart, lab work & pertinent test results  Airway Mallampati: II  TM Distance: >3 FB     Dental  (+) Missing, Poor Dentition, Chipped,    Pulmonary    Pulmonary exam normal        Cardiovascular hypertension, Pt. on medications Normal cardiovascular exam     Neuro/Psych  Headaches,  Neuromuscular disease    GI/Hepatic   Endo/Other  diabetes, Well Controlled  Renal/GU      Musculoskeletal  (+) Arthritis , Osteoarthritis,    Abdominal Normal abdominal exam  (+) + scaphoid   Peds  Hematology   Anesthesia Other Findings   Reproductive/Obstetrics                             Anesthesia Physical Anesthesia Plan  ASA: III  Anesthesia Plan: Spinal   Post-op Pain Management:    Induction:   Airway Management Planned: Nasal Cannula  Additional Equipment:   Intra-op Plan:   Post-operative Plan:   Informed Consent: I have reviewed the patients History and Physical, chart, labs and discussed the procedure including the risks, benefits and alternatives for the proposed anesthesia with the patient or authorized representative who has indicated his/her understanding and acceptance.   Dental advisory given  Plan Discussed with: CRNA  Anesthesia Plan Comments:         Anesthesia Quick Evaluation

## 2015-05-23 NOTE — Progress Notes (Signed)
PROGRESS NOTE  Catherine [name] [id number] DOB: Oct 17, 1927 DOA: 05/22/2015 PCP: Chesley Noon, MD  HPI/Recap of past 24 hours: Patient is an 82 year old female with past medical history of obesity, diabetes mellitus, Parkinson's disease with secondary tremor who sustained a mechanical fall on 1/12 landing on her left side. She is unable to bear weight and taken to the emergency room where she was found to have an acute subcapital fracture of the left hip. Patient admitted to the hospitalist service and orthopedic surgery consulted.  Patient cleared for surgery and orthopedics plans to take patient for hip fracture repair later today. Patient seen on the floor after arrival from emergency room and she, herself with no complaints other than left sided soreness.   Assessment/Plan: Closed left hip fracture: Seen by orthopedic surgery and for repair later today.  Parkinson's disease with secondary tremor: Continue amantadine   Essential hypertension: Blood pressure stable   Code Status: DO NOT RESUSCITATE  Family Communication: Husband and son at the bedside   Disposition Plan: Likely skilled nursing early next week    Consultants:  Orthopedic surgery  Procedures:  Hip fracture repair pending   Antibiotics:  None    Objective: BP 142/53 mmHg  Pulse 66  Temp(Src) 97.5 F (36.4 C) (Oral)  Resp 15  Ht 5' (1.524 m)  Wt 60.782 kg (134 lb)  BMI 26.17 kg/m2  SpO2 98%  Intake/Output Summary (Last 24 hours) at 05/23/15 1743 Last data filed at 05/23/15 1720  Gross per 24 hour  Intake    800 ml  Output    900 ml  Net   -100 ml   Filed Weights   05/22/15 2023 05/23/15 0049 05/23/15 1328  Weight: 59.875 kg (132 lb) 60.827 kg (134 lb 1.6 oz) 60.782 kg (134 lb)    Exam:   General:  Alert and oriented 3, no acute distress, resting tremor  Cardiovascular: Regular rate and rhythm, S1 and S2   Respiratory: clear to auscultation bilaterally   Abdomen: soft,  nontender, nondistended, positive bowel sounds   Musculoskeletal: no clubbing or cyanosis or edema    Data Reviewed: Basic Metabolic Panel:  Recent Labs Lab 05/22/15 2130 05/23/15 0651  NA 140 141  K 4.1 3.9  CL 102 108  CO2 27 28  GLUCOSE 143* 109*  BUN 22* 16  CREATININE 0.88 0.70  CALCIUM 9.4 8.8*   Liver Function Tests: No results for input(s): AST, ALT, ALKPHOS, BILITOT, PROT, ALBUMIN in the last 168 hours. No results for input(s): LIPASE, AMYLASE in the last 168 hours. No results for input(s): AMMONIA in the last 168 hours. CBC:  Recent Labs Lab 05/22/15 2130 05/23/15 0651  WBC 13.9* 6.4  NEUTROABS 10.9*  --   HGB 13.6 11.8*  HCT 40.1 34.9*  MCV 94.1 94.1  PLT 173 149*   Cardiac Enzymes:    Recent Labs Lab 05/22/15 2130  TROPONINI <0.03   BNP (last 3 results) No results for input(s): BNP in the last 8760 hours.  ProBNP (last 3 results) No results for input(s): PROBNP in the last 8760 hours.  CBG:  Recent Labs Lab 05/23/15 1324  GLUCAP 84    Recent Results (from the past 240 hour(s))  Surgical pcr screen     Status: None   Collection Time: 05/23/15 12:17 AM  Result Value Ref Range Status   MRSA, PCR NEGATIVE NEGATIVE Final   Staphylococcus aureus NEGATIVE NEGATIVE Final    Comment:        The  Xpert SA Assay (FDA approved for NASAL specimens in patients over 10 years of age), is one component of a comprehensive surveillance program.  Test performance has been validated by [facility] for patients greater than or equal to 62 year old. It is not intended to diagnose infection nor to guide or monitor treatment.      Studies: Dg Chest 1 View  05/22/2015  CLINICAL DATA:  Preoperative examination for patient with a left hip fracture. Initial encounter. EXAM: CHEST 1 VIEW COMPARISON:  None. FINDINGS: There is some coarsening of the pulmonary interstitium. No consolidative process, pneumothorax or effusion. Punctate granuloma right lower  lobe noted. Heart size is normal. Aortic atherosclerosis is seen. IMPRESSION: No acute disease. Electronically Signed   By: Inge Rise M.D.   On: 05/22/2015 21:10   Dg Hip Operative Unilat With Pelvis Left  05/23/2015  CLINICAL DATA:  Left hip fracture.  Initial encounter EXAM: OPERATIVE left HIP (WITH PELVIS IF PERFORMED) 2 VIEWS TECHNIQUE: Fluoroscopic spot image(s) were submitted for interpretation post-operatively. COMPARISON:  Radiography from yesterday FINDINGS: The subcapital left femoral neck fracture has been transfixed with 3 screws. No evidence of screw articular surface transgression or femoral head collapse. Fracture alignment is similar to preoperative study. IMPRESSION: Fluoroscopy for femoral neck fracture ORIF. Electronically Signed   By: Monte Fantasia M.D.   On: 05/23/2015 17:27   Dg Hip Unilat With Pelvis 2-3 Views Left  05/22/2015  CLINICAL DATA:  Status post fall today. Severe left hip pain. Initial encounter. EXAM: DG HIP (WITH OR WITHOUT PELVIS) 2-3V LEFT COMPARISON:  Single view of the pelvis 04/01/2015. FINDINGS: The patient has an acute mildly impacted subcapital fracture of the left hip. No other acute bony or joint abnormality is identified. IMPRESSION: Acute subcapital fracture left hip. Electronically Signed   By: Inge Rise M.D.   On: 05/22/2015 20:55    Scheduled Meds: . [MAR Hold] amantadine  100 mg Oral TID  . [MAR Hold] heparin  5,000 Units Subcutaneous 3 times per day  . [MAR Hold] loratadine  10 mg Oral Daily  . [MAR Hold] ramipril  2.5 mg Oral Daily  . [MAR Hold] senna  1 tablet Oral QHS    Continuous Infusions: . sodium chloride 75 mL/hr at 05/22/15 2140  . lactated ringers       Time spe15 minutes   [place] [facility] Pager [phone] . If 7PM-7AM, please contact night-coverage at [email or web address], password [facility] 05/23/2015, 5:43 PM  LOS: 1 day

## 2015-05-23 NOTE — Op Note (Signed)
Surgical dictation 05/23/2015  Preop diagnosis left femoral neck fracture Postop diagnosis same Procedure closed reduction open treatment internal fixation left femoral neck fracture with 3 titanium cannulated screws from [place] set  Surgeon Aline Brochure  Anesthesia spinal  Complications none  Assisted by Simonne Maffucci  Estimated blood loss 100 mL  Details of procedure  First identified the patient as Catherine [name] and confirmed left hip as a surgical site and then marked as such. Chart review completed.  The patient was taken to the operating suite and she had spinal anesthesia. She was then placed on a fracture table. The right leg was placed in extension and abduction the left leg was placed in a traction device appropriate padding was placed  C-arm was brought in fracture was already reduced and in stable position on the AP and lateral C-arm. Picture.  We then prepped the leg and then took timeout after draping.  A longitudinal incision was made over the greater trochanter extended proximally and distally. The patient had a thick layer of adipose tissue which required a more extensive incision. The fascia was identified split in line with skin incision and then subperiosteal dissection was performed to expose the proximal femur  Just distal to the greater trochanter at the flare a K wire was placed in the center the femoral head on AP and lateral x-ray  A cluster pin guide was placed and an inverted triangle pin configuration was accomplished and confirmed by AP and lateral x-ray. Each pin was measured drilled cortex only and then screw placed. Once all 3 screws were in I felt that the superior anterior screw was too long and decreased by 5 mm. I placed another screw over the same guidewire.  Rate grafts confirm all screws to be in good position fracture reduction be maintained  We then irrigated the wound with copious amounts of saline  We closed the wound with #1 Bralon and 0  Monocryl. At each layer of closure we injected 30 mL of Marcaine with epinephrine.  A sterile dressing was applied  The patient was taken recovery in stable condition  She will be weightbearing as tolerated

## 2015-05-23 NOTE — Clinical Social Work Placement (Signed)
   CLINICAL SOCIAL WORK PLACEMENT  NOTE  Date:  05/23/2015  Patient Details  Name: Catherine [name] MRN: [id number] Date of Birth: 03-Jun-1927  Clinical Social Work is seeking post-discharge placement for this patient at the [place] level of care (*CSW will initial, date and re-position this form in  chart as items are completed):  Yes   Patient/family provided with [place] Work Department's list of facilities offering this level of care within the geographic area requested by the patient (or if unable, by the patient's family).  Yes   Patient/family informed of their freedom to choose among providers that offer the needed level of care, that participate in Medicare, Medicaid or managed care program needed by the patient, have an available bed and are willing to accept the patient.  Yes   Patient/family informed of [facility]'s ownership interest in [facility] and [facility], as well as of the fact that they are under no obligation to receive care at these facilities.  PASRR submitted to EDS on       PASRR number received on       Existing PASRR number confirmed on 05/23/15     FL2 transmitted to all facilities in geographic area requested by pt/family on 05/23/15     FL2 transmitted to all facilities within larger geographic area on       Patient informed that his/her managed care company has contracts with or will negotiate with certain facilities, including the following:            Patient/family informed of bed offers received.  Patient chooses bed at       Physician recommends and patient chooses bed at      Patient to be transferred to   on  .  Patient to be transferred to facility by       Patient family notified on   of transfer.  Name of family member notified:        PHYSICIAN       Additional Comment:    _______________________________________________ Salome Arnt, [place] 05/23/2015, 10:30  AM [phone]

## 2015-05-23 NOTE — Transfer of Care (Signed)
Immediate Anesthesia Transfer of Care Note  Patient: Catherine [name]  Procedure(s) Performed: Procedure(s): INTERNAL FIXATION LEFT HIP (Left)  Patient Location: PACU  Anesthesia Type:Spinal  Level of Consciousness: sedated  Airway & Oxygen Therapy: Patient Spontanous Breathing and Patient connected to face mask oxygen  Post-op Assessment: Report given to RN and Post -op Vital signs reviewed and stable  Post vital signs: Reviewed and stable  Last Vitals:  Filed Vitals:   05/23/15 1435 05/23/15 1440  BP: 147/65 141/63  Pulse:    Temp:    Resp: 12 16    Complications: No apparent anesthesia complications

## 2015-05-23 NOTE — Clinical Social Work Note (Signed)
Clinical Social Work Assessment  Patient Details  Name: Catherine [name] MRN: [id number] Date of Birth: [birth year]  Date of referral:  05/23/15               Reason for consult:  Discharge Planning                Permission sought to share information with:  Other Ardyth Gal- friend) Permission granted to share information::  Yes, Verbal Permission Granted  Name::     Scientist, physiological::     Relationship::  Friend at bedside  Contact Information:     Housing/Transportation Living arrangements for the past 2 months:  [place] of Information:  Patient, Other (Comment Required) Patient Interpreter Needed:  None Criminal Activity/Legal Involvement Pertinent to Current Situation/Hospitalization:  No - Comment as needed Significant Relationships:  Other Family Members, Friend Lives with:  Facility Resident Do you feel safe going back to the place where you live?  Yes Need for family participation in patient care:  Yes (Comment)  Care giving concerns:  Pt has hip fracture.    Social Worker assessment / plan:  CSW met with pt and pt's friend, Mudlogger at bedside. Pt alert and oriented and shared that she has been a resident at [facility] since September. Pt is very happy there and loves her room. She reports that [facility] and her nephew, Jenny Reichmann are her best support. Pt states that she fell yesterday and Ardyth Gal convinced her to go to ED to be evaluated. Surgery planned for later today. Pt understands that SNF will likely be recommended, but is very concerned about losing her room at [facility]. She requests CSW to call facility to discuss, but agrees to initiate SNF bed search in [place] only. Pt has been to [facility] in the past. Per Tammy at [facility], pt ambulates independently with a walker at baseline. She requires assist with lower body dressing and bathing and has been participating in in house therapy. Tammy indicates pt did not fall yesterday and last fall was 05/16/14. She is  aware of hip fracture and also agrees pt will need SNF at d/c. Tammy plans to discuss with Ardyth Gal pt's concerns and work with them on bed situation as pt has long term care insurance. SNF list provided to pt.   Employment status:  Retired Nurse, adult PT Recommendations:  Not assessed at this time (anticipate SNF) Information / Referral to community resources:  [place]  Patient/Family's Response to care:  Pt is anxious about losing bed at [facility].   Patient/Family's Understanding of and Emotional Response to Diagnosis, Current Treatment, and Prognosis:  Pt aware of admission diagnosis and surgery today. Aware anticipating need for SNF, and pt is agreeable to begin search.   Emotional Assessment Appearance:  Appears stated age Attitude/Demeanor/Rapport:  Other (Cooperative) Affect (typically observed):  Appropriate Orientation:  Oriented to Self, Oriented to Place, Oriented to  Time, Oriented to Situation Alcohol / Substance use:  Not Applicable Psych involvement (Current and /or in the community):  No (Comment)  Discharge Needs  Concerns to be addressed:  Discharge Planning Concerns Readmission within the last 30 days:  No Current discharge risk:  Physical Impairment Barriers to Discharge:  Continued Medical Work up   [employer], [employer], [place] 05/23/2015, 10:32 AM [phone]

## 2015-05-24 DIAGNOSIS — G20 Parkinson's disease: Secondary | ICD-10-CM | POA: Diagnosis present

## 2015-05-24 LAB — CBC
HEMATOCRIT: 33 % — AB (ref 36.0–46.0)
HEMOGLOBIN: 11.1 g/dL — AB (ref 12.0–15.0)
MCH: 31.6 pg (ref 26.0–34.0)
MCHC: 33.6 g/dL (ref 30.0–36.0)
MCV: 94 fL (ref 78.0–100.0)
Platelets: 124 10*3/uL — ABNORMAL LOW (ref 150–400)
RBC: 3.51 MIL/uL — AB (ref 3.87–5.11)
RDW: 13.3 % (ref 11.5–15.5)
WBC: 7.2 10*3/uL (ref 4.0–10.5)

## 2015-05-24 LAB — BASIC METABOLIC PANEL
ANION GAP: 7 (ref 5–15)
BUN: 12 mg/dL (ref 6–20)
CHLORIDE: 107 mmol/L (ref 101–111)
CO2: 26 mmol/L (ref 22–32)
CREATININE: 0.65 mg/dL (ref 0.44–1.00)
Calcium: 8.5 mg/dL — ABNORMAL LOW (ref 8.9–10.3)
GFR calc non Af Amer: >60 mL/min (ref >60)
Glucose, Bld: 90 mg/dL (ref 65–99)
Potassium: 3.9 mmol/L (ref 3.5–5.1)
SODIUM: 140 mmol/L (ref 135–145)

## 2015-05-24 LAB — URINE CULTURE: Culture: NO GROWTH

## 2015-05-24 NOTE — Progress Notes (Signed)
PROGRESS NOTE  Catherine [name] [id number] DOB: 1927/09/17 DOA: 05/22/2015 PCP: Chesley Noon, MD  HPI/Recap of past 24 hours: Patient is an 82 year old female with past medical history of obesity, diabetes mellitus, Parkinson's disease with secondary tremor who sustained a mechanical fall on 1/12 landing on her left side. She is unable to bear weight and taken to the emergency room where she was found to have an acute subcapital fracture of the left hip. Patient admitted to the hospitalist service and orthopedic surgery consulted.  Patient cleared for surgery and she was taken by orthopedic surgery for ORIF on 1/13 afternoon. Patient tolerated procedure well. Today, she complains of some mild soreness, but otherwise no complaints  Assessment/Plan: Closed left hip fracture: Status post ORIF. Monitor hemoglobin. Skilled nursing Monday  Parkinson's disease with secondary tremor: Continue amantadine   Essential hypertension: Blood pressure stable   Code Status: DO NOT RESUSCITATE  Family Communication: Husband and son at the bedside   Disposition Plan: Likely skilled nursing Monday   Consultants:  Orthopedic surgery  Procedures:  1/13: ORIF of left hip  Antibiotics:  None    Objective: BP 121/58 mmHg  Pulse 71  Temp(Src) 98 F (36.7 C) (Oral)  Resp 16  Ht 5' (1.524 m)  Wt 60.782 kg (134 lb)  BMI 26.17 kg/m2  SpO2 96%  Intake/Output Summary (Last 24 hours) at 05/24/15 1511 Last data filed at 05/24/15 1506  Gross per 24 hour  Intake   3345 ml  Output   2150 ml  Net   1195 ml   Filed Weights   05/22/15 2023 05/23/15 0049 05/23/15 1328  Weight: 59.875 kg (132 lb) 60.827 kg (134 lb 1.6 oz) 60.782 kg (134 lb)    Exam: No change from previous  General:  Alert and oriented 3, no acute distress, resting tremor  Cardiovascular: Regular rate and rhythm, S1 and S2   Respiratory: clear to auscultation bilaterally   Abdomen: soft, nontender, nondistended,  positive bowel sounds   Musculoskeletal: no clubbing or cyanosis or edema    Data Reviewed: Basic Metabolic Panel:  Recent Labs Lab 05/22/15 2130 05/23/15 0651 05/24/15 0642  NA 140 141 140  K 4.1 3.9 3.9  CL 102 108 107  CO2 27 28 26   GLUCOSE 143* 109* 90  BUN 22* 16 12  CREATININE 0.88 0.70 0.65  CALCIUM 9.4 8.8* 8.5*   Liver Function Tests: No results for input(s): AST, ALT, ALKPHOS, BILITOT, PROT, ALBUMIN in the last 168 hours. No results for input(s): LIPASE, AMYLASE in the last 168 hours. No results for input(s): AMMONIA in the last 168 hours. CBC:  Recent Labs Lab 05/22/15 2130 05/23/15 0651 05/24/15 0642  WBC 13.9* 6.4 7.2  NEUTROABS 10.9*  --   --   HGB 13.6 11.8* 11.1*  HCT 40.1 34.9* 33.0*  MCV 94.1 94.1 94.0  PLT 173 149* 124*   Cardiac Enzymes:    Recent Labs Lab 05/22/15 2130  TROPONINI <0.03   BNP (last 3 results) No results for input(s): BNP in the last 8760 hours.  ProBNP (last 3 results) No results for input(s): PROBNP in the last 8760 hours.  CBG:  Recent Labs Lab 05/23/15 1324  GLUCAP 84    Recent Results (from the past 240 hour(s))  Urine culture     Status: None   Collection Time: 05/22/15 10:02 PM  Result Value Ref Range Status   Specimen Description URINE, CATHETERIZED  Final   Special Requests NONE  Final  Culture   Final    NO GROWTH 1 DAY Performed at [facility]    Report Status 05/24/2015 FINAL  Final  Surgical pcr screen     Status: None   Collection Time: 05/23/15 12:17 AM  Result Value Ref Range Status   MRSA, PCR NEGATIVE NEGATIVE Final   Staphylococcus aureus NEGATIVE NEGATIVE Final    Comment:        The Xpert SA Assay (FDA approved for NASAL specimens in patients over 77 years of age), is one component of a comprehensive surveillance program.  Test performance has been validated by [facility] for patients greater than or equal to 71 year old. It is not intended to diagnose infection nor  to guide or monitor treatment.      Studies: Dg Hip Operative Unilat With Pelvis Left  05/23/2015  CLINICAL DATA:  Left hip fracture.  Initial encounter EXAM: OPERATIVE left HIP (WITH PELVIS IF PERFORMED) 2 VIEWS TECHNIQUE: Fluoroscopic spot image(s) were submitted for interpretation post-operatively. COMPARISON:  Radiography from yesterday FINDINGS: The subcapital left femoral neck fracture has been transfixed with 3 screws. No evidence of screw articular surface transgression or femoral head collapse. Fracture alignment is similar to preoperative study. IMPRESSION: Fluoroscopy for femoral neck fracture ORIF. Electronically Signed   By: Monte Fantasia M.D.   On: 05/23/2015 17:27    Scheduled Meds: . amantadine  100 mg Oral TID  . aspirin EC  325 mg Oral Q breakfast  . docusate sodium  100 mg Oral BID  . loratadine  10 mg Oral Daily  . ramipril  2.5 mg Oral Daily  . senna  1 tablet Oral QHS    Continuous Infusions: . sodium chloride 75 mL/hr at 05/24/15 0655     Time spent: 15 minutes   [place] [facility] Pager [phone] . If 7PM-7AM, please contact night-coverage at [email or web address], password [facility] 05/24/2015, 3:11 PM  LOS: 2 days

## 2015-05-24 NOTE — Progress Notes (Signed)
Subjective: 1 Day Post-Op Procedure(s) (LRB): INTERNAL FIXATION LEFT HIP (Left) Patient reports pain as mild.    Objective: Vital signs in last 24 hours: Temp:  [97 F (36.1 C)-99.3 F (37.4 C)] 97.5 F (36.4 C) (01/14 0500) Pulse Rate:  [64-78] 68 (01/14 0500) Resp:  [11-16] 16 (01/14 0500) BP: (120-169)/(44-74) 125/53 mmHg (01/14 0500) SpO2:  [82 %-100 %] 97 % (01/14 0500) Weight:  [134 lb (60.782 kg)] 134 lb (60.782 kg) (01/13 1328)  Intake/Output from previous day: 01/13 0701 - 01/14 0700 In: 3325 [I.V.:3225; IV Piggyback:100] Out: 1600 [Urine:1600] Intake/Output this shift: Total I/O In: 100 [P.O.:100] Out: -    Recent Labs  05/22/15 2130 05/23/15 0651 05/24/15 0642  HGB 13.6 11.8* 11.1*    Recent Labs  05/23/15 0651 05/24/15 0642  WBC 6.4 7.2  RBC 3.71* 3.51*  HCT 34.9* 33.0*  PLT 149* 124*    Recent Labs  05/23/15 0651 05/24/15 0642  NA 141 140  K 3.9 3.9  CL 108 107  CO2 28 26  BUN 16 12  CREATININE 0.70 0.65  GLUCOSE 109* 90  CALCIUM 8.8* 8.5*    Recent Labs  05/22/15 2130  INR 1.18    Neurologically intact Neurovascular intact Sensation intact distally Intact pulses distally Dorsiflexion/Plantar flexion intact  Assessment/Plan: 1 Day Post-Op Procedure(s) (LRB): INTERNAL FIXATION LEFT HIP (Left) Advance diet Up with therapy  Catherine [name] 05/24/2015, 9:17 AM

## 2015-05-24 NOTE — Evaluation (Signed)
Physical Therapy Evaluation Patient Details Name: Catherine [name] MRN: [id number] DOB: 1927-10-28 Today's Date: 05/24/2015   History of Present Illness  Pt is a resident at an [facility] facility.  She was rushing to the phone and tripped and fell.  She is having therapy at the facility she was at due to decreasig ambulation ability.  PMH includes Parkinsons  Clinical Impression  Pt is an assistive living resident who was receiving physical therapy to improve the safety of her gait who tripped and fell.  She will need  Skilled PT with SNF placement to maximize her functional mobility.    Follow Up Recommendations SNF    Equipment Recommendations  None recommended by PT    Recommendations for Other Services       Precautions / Restrictions Precautions Precautions: Fall Restrictions Weight Bearing Restrictions: Yes LLE Weight Bearing: Weight bearing as tolerated      Mobility  Bed Mobility Overal bed mobility: Needs Assistance Bed Mobility: Supine to Sit;Sit to Supine     Supine to sit: Max assist Sit to supine: Max assist      Transfers                 General transfer comment: unable safely; pt request to go back to bed due to pain  Ambulation/Gait    unable at this time                      Pertinent Vitals/Pain Pain Assessment: 0-10 Pain Score: 10-Worst pain ever Pain Location: LT hip after therapy; pt does not want pain medication  Pain Descriptors / Indicators: Aching Pain Intervention(s): Limited activity within patient's tolerance;Repositioned    Home Living Family/patient expects to be discharged to:: Daisy: Walker - 2 wheels      Prior Function Level of Independence: Needs assistance   Gait / Transfers Assistance Needed: Needed supervison with rolling waler  ADL's / Homemaking Assistance Needed: at assistive living facility        Hand Dominance   Dominant Hand:  Right    Extremity/Trunk Assessment               Lower Extremity Assessment: LLE deficits/detail;RLE deficits/detail RLE Deficits / Details: strength 3-/5 LLE Deficits / Details: strength generally 2+/5; limited ROM due to pain.     Communication   Communication: No difficulties  Cognition Arousal/Alertness: Awake/alert   Overall Cognitive Status: Within Functional Limits for tasks assessed                         Exercises General Exercises - Lower Extremity Ankle Circles/Pumps: Both;10 reps Quad Sets: Both;10 reps Gluteal Sets: Both;10 reps Long Arc Quad: Both;5 reps Heel Slides: Both;5 reps      Assessment/Plan    PT Assessment Patient needs continued PT services  PT Diagnosis Difficulty walking;Generalized weakness;Acute pain   PT Problem List Decreased strength;Decreased range of motion;Decreased activity tolerance;Decreased balance;Decreased knowledge of use of DME;Pain  PT Treatment Interventions Therapeutic activities;Therapeutic exercise;Functional mobility training;Balance training;DME instruction;Patient/family education   PT Goals (Current goals can be found in the Care Plan section) Acute Rehab PT Goals PT Goal Formulation: With patient Time For Goal Achievement: 05/28/15 Potential to Achieve Goals: Fair    Frequency Min 6X/week  End of Session Equipment Utilized During Treatment: Gait belt Activity Tolerance: Patient limited by fatigue;Patient limited by pain Patient left: in bed;with call bell/phone within reach;with bed alarm set           Time: 1120-1200 PT Time Calculation (min) (ACUTE ONLY): 40 min   Charges:   PT Evaluation $PT Eval Moderate Complexity: 1 Procedure PT Treatments $Therapeutic Exercise: 8-22 mins   PT G CodesRayetta Humphrey, PT CLT [phone] 05/24/2015, 12:00 PM

## 2015-05-24 NOTE — Anesthesia Postprocedure Evaluation (Signed)
Anesthesia Post Note  Patient: Catherine [name]  Procedure(s) Performed: Procedure(s) (LRB): INTERNAL FIXATION LEFT HIP (Left)  Patient location during evaluation: Nursing Unit Anesthesia Type: Spinal Level of consciousness: awake and alert and patient cooperative Pain management: pain level controlled Vital Signs Assessment: post-procedure vital signs reviewed and stable Respiratory status: spontaneous breathing and patient connected to nasal cannula oxygen Cardiovascular status: blood pressure returned to baseline Postop Assessment: no headache, patient able to bend at knees, no backache and adequate PO intake Anesthetic complications: no    Last Vitals:  Filed Vitals:   05/24/15 0158 05/24/15 0500  BP: 126/54 125/53  Pulse: 78 68  Temp: 37.4 C 36.4 C  Resp: 16 16    Last Pain:  Filed Vitals:   05/24/15 0829  PainSc: 8                  Deone Omahoney J

## 2015-05-25 LAB — CBC
HEMATOCRIT: 35.9 % — AB (ref 36.0–46.0)
Hemoglobin: 12.2 g/dL (ref 12.0–15.0)
MCH: 31.9 pg (ref 26.0–34.0)
MCHC: 34 g/dL (ref 30.0–36.0)
MCV: 93.7 fL (ref 78.0–100.0)
PLATELETS: 138 10*3/uL — AB (ref 150–400)
RBC: 3.83 MIL/uL — ABNORMAL LOW (ref 3.87–5.11)
RDW: 13.2 % (ref 11.5–15.5)
WBC: 8.5 10*3/uL (ref 4.0–10.5)

## 2015-05-25 LAB — BASIC METABOLIC PANEL
ANION GAP: 9 (ref 5–15)
BUN: 10 mg/dL (ref 6–20)
CALCIUM: 8.8 mg/dL — AB (ref 8.9–10.3)
CO2: 27 mmol/L (ref 22–32)
CREATININE: 0.62 mg/dL (ref 0.44–1.00)
Chloride: 103 mmol/L (ref 101–111)
GFR calc non Af Amer: >60 mL/min (ref >60)
GLUCOSE: 88 mg/dL (ref 65–99)
Potassium: 3.5 mmol/L (ref 3.5–5.1)
Sodium: 139 mmol/L (ref 135–145)

## 2015-05-25 MED ORDER — ASPIRIN 325 MG PO TBEC: 325.0000 mg | DELAYED_RELEASE_TABLET | Freq: Every day | ORAL | Status: DC

## 2015-05-25 MED ORDER — HYDROCODONE-ACETAMINOPHEN 5-325 MG PO TABS: 1.0000 | ORAL_TABLET | Freq: Four times a day (QID) | ORAL | Status: DC | PRN

## 2015-05-25 MED ORDER — DOCUSATE SODIUM 100 MG PO CAPS: 100.0000 mg | ORAL_CAPSULE | Freq: Two times a day (BID) | ORAL | Status: DC

## 2015-05-25 NOTE — Progress Notes (Signed)
Postoperative note  Postoperative day number  2 ORIF LEFT FEMORAL NECK   Vital signs  BP 143/60 mmHg  Pulse 74  Temp(Src) 98.4 F (36.9 C) (Oral)  Resp 18  Ht 5' (1.524 m)  Wt 134 lb (60.782 kg)  BMI 26.17 kg/m2  SpO2 96%   Pertinent labs   CBC Latest Ref Rng 05/25/2015 05/24/2015 05/23/2015  WBC 4.0 - 10.5 K/uL 8.5 7.2 6.4  Hemoglobin 12.0 - 15.0 g/dL 12.2 11.1(L) 11.8(L)  Hematocrit 36.0 - 46.0 % 35.9(L) 33.0(L) 34.9(L)  Platelets 150 - 400 K/uL 138(L) 124(L) 149(L)    Assessment and plan   CONTINUE GAIT TRAIN AS TOLERATED  OK TO SNF TOMORROW  Hip fracture instructions for SCREWS  Procedure open treatment internal fixation with 3 SCREWS  Weightbearing status as tolerated  Staples removed postop day #  12  Followup visit. Postop day # 28th  X-rays please have x-rays performed. Postop day # 28  DVT prophylaxis x 28 days

## 2015-05-25 NOTE — Discharge Summary (Signed)
Discharge Summary  Catherine [name] [id number] DOB: 1928-01-15  PCP: Chesley Noon, MD  Admit date: 05/22/2015 Anticipated Discharge date: 05/26/2015  Time spent: 25 minutes   Recommendations for Outpatient Follow-up:  1. Medication change: For the next 30 days, patient will stop aspirin 81 mg and increased to 325 mg by mouth daily for DVT prophylaxis 2. Patient being discharged to skilled nursing facility for rehabilitation 3. New medication: Percocet 5/325 by mouth every 6 hours for pain  Discharge Diagnoses:  Active Hospital Problems   Diagnosis Date Noted  . Left displaced femoral neck fracture ([place]) 05/23/2015  . Parkinson's disease ([place]) 05/24/2015  . 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 Type 2 diabetes mellitus ([place]) 07/21/2011    Resolved Hospital Problems   Diagnosis Date Noted Date Resolved  No resolved problems to display.    Discharge Condition: improved, being discharged to skilled nursing   Diet recommendation: carb modified   Filed Weights   05/22/15 2023 05/23/15 0049 05/23/15 1328  Weight: 59.875 kg (132 lb) 60.827 kg (134 lb 1.6 oz) 60.782 kg (134 lb)    History of present illness:  Patient is an 82 year old female with past medical history of obesity, diabetes mellitus, Parkinson's disease with secondary tremor who sustained a mechanical fall on 1/12 landing on her left side. She is unable to bear weight and taken to the emergency room where she was found to have an acute subcapital fracture of the left hip. Patient admitted to the hospitalist service and orthopedic surgery consulted.  Hospital Course:  Principal Problem:   Left displaced femoral neck fracture [facility]): Patient cleared for surgery. Taken by orthopedics for ORIF on 1/13 which she tolerated well. Postprocedure no complications. Cleared to go to skilled nursing on Monday 1/16. Weightbearing as tolerated.increase aspirin to 225 mg for the next one month for DVT prophylaxis Active  Problems:   Type 2 diabetes mellitus ([place]): Blood sugar stable during hospitalization   Parkinson's disease (H[facility])with secondary tremor: Stable. Continued on amantadine    Procedures:  Status post ORIF of left hip on 1/13  Consultations:  Orthopedic surgery   Discharge Exam: BP 151/61 mmHg  Pulse 79  Temp(Src) 97.8 F (36.6 C) (Oral)  Resp 18  Ht 5' (1.524 m)  Wt 60.782 kg (134 lb)  BMI 26.17 kg/m2  SpO2 97%  General: alert and oriented 3, no acute distress  Cardiovascular: regular rate and rhythm, S1-S2  Respiratory: clear to auscultation bilaterally   Discharge Instructions You were cared for by a hospitalist during your hospital stay. If you have any questions about your discharge medications or the care you received while you were in the hospital after you are discharged, you can call the unit and asked to speak with the hospitalist on call if the hospitalist that took care of you is not available. Once you are discharged, your primary care physician will handle any further medical issues. Please note that NO REFILLS for any discharge medications will be authorized once you are discharged, as it is imperative that you return to your primary care physician (or establish a relationship with a primary care physician if you do not have one) for your aftercare needs so that they can reassess your need for medications and monitor your lab values.     Medication List    STOP taking these medications        ALEVE 220 MG tablet  Generic drug:  naproxen sodium     aspirin 81 MG chewable tablet  Replaced  by:  aspirin 325 MG EC tablet      TAKE these medications        amantadine 100 MG capsule  Commonly known as:  SYMMETREL  Take 100 mg by mouth 3 (three) times daily.     aspirin 325 MG EC tablet  Take 1 tablet (325 mg total) by mouth daily with breakfast.     benzonatate 100 MG capsule  Commonly known as:  TESSALON  Take 100 mg by mouth 2 (two) times daily. 2 week  course for cough to be completed on 05/07/15     diclofenac sodium 1 % Gel  Commonly known as:  VOLTAREN  Place 2 g onto the skin daily as needed (pain).     docusate sodium 100 MG capsule  Commonly known as:  COLACE  Take 1 capsule (100 mg total) by mouth 2 (two) times daily.     furosemide 20 MG tablet  Commonly known as:  LASIX  Take 20 mg by mouth daily.     HYDROcodone-acetaminophen 5-325 MG tablet  Commonly known as:  NORCO/VICODIN  Take 1-2 tablets by mouth every 6 (six) hours as needed for moderate pain.     loratadine 10 MG tablet  Commonly known as:  CLARITIN  Take 10 mg by mouth daily.     mineral oil liquid  Place into both ears every Monday. Instill 2 drops in both ears once daily every Monday for Cerumen Impaction.     polyethylene glycol packet  Commonly known as:  MIRALAX / GLYCOLAX  Take 17 g by mouth daily as needed for mild constipation.     potassium chloride 10 MEQ tablet  Commonly known as:  K-DUR  Take 10 mEq by mouth daily.     ramipril 2.5 MG capsule  Commonly known as:  ALTACE  Take 2.5 mg by mouth daily.     ROBITUSSIN COLD COUGH+ CHEST 10-100 MG/5ML liquid  Generic drug:  dextromethorphan-guaiFENesin  Take 20 mLs by mouth 3 (three) times daily. Reported on 04/30/2015     senna 8.6 MG Tabs tablet  Commonly known as:  SENOKOT  Take 1 tablet by mouth at bedtime.     vitamin B-12 1000 MCG tablet  Commonly known as:  CYANOCOBALAMIN  Take 1,000 mcg by mouth daily.       Allergies  Allergen Reactions  . Sulfa Antibiotics Rash  . Azilect [Rasagiline] Rash  . Fenofibrate Rash  . Adhesive [Tape] Rash and Other (See Comments)    Redness  . Sinemet [Carbidopa-Levodopa] Rash  . Tomato Rash      The results of significant diagnostics from this hospitalization (including imaging, microbiology, ancillary and laboratory) are listed below for reference.    Significant Diagnostic Studies: Dg Chest 1 View  05/22/2015  CLINICAL DATA:   Preoperative examination for patient with a left hip fracture. Initial encounter. EXAM: CHEST 1 VIEW COMPARISON:  None. FINDINGS: There is some coarsening of the pulmonary interstitium. No consolidative process, pneumothorax or effusion. Punctate granuloma right lower lobe noted. Heart size is normal. Aortic atherosclerosis is seen. IMPRESSION: No acute disease. Electronically Signed   By: Inge Rise M.D.   On: 05/22/2015 21:10   Ct Head Wo Contrast  04/30/2015  CLINICAL DATA:  82 year old female with fall and trauma to the head. EXAM: CT HEAD WITHOUT CONTRAST CT CERVICAL SPINE WITHOUT CONTRAST TECHNIQUE: Multidetector CT imaging of the head and cervical spine was performed following the standard protocol without intravenous contrast. Multiplanar CT image reconstructions  of the cervical spine were also generated. COMPARISON:  Brain MRI dated 04/01/2015 and CT dated 04/01/2015 FINDINGS: CT HEAD FINDINGS There is slight prominence of the ventricles and sulci compatible with age-related volume loss. Mild periventricular and deep white matter hypodensities represent chronic microvascular ischemic changes. There is no intracranial hemorrhage. No mass effect or midline shift identified. There is partial as mentioned of the visualized right maxillary sinus. The remainder of the paranasal sinuses and mastoid air cells are well aerated. The calvarium is intact. CT CERVICAL SPINE FINDINGS There is no acute fracture or subluxation of the cervical spine.There multilevel degenerative changes of the spine most prominent at C5-C6 and C6-C7. There is endplate irregularity and disc space narrowing.The odontoid and spinous processes are intact.There is normal anatomic alignment of the C1-C2 lateral masses. The visualized soft tissues appear unremarkable. IMPRESSION: No acute intracranial hemorrhage. Mild age-related atrophy and chronic microvascular ischemic disease. No acute/traumatic cervical spine pathology.  Electronically Signed   By: Anner Crete M.D.   On: 04/30/2015 21:16   Ct Cervical Spine Wo Contrast  04/30/2015  CLINICAL DATA:  82 year old female with fall and trauma to the head. EXAM: CT HEAD WITHOUT CONTRAST CT CERVICAL SPINE WITHOUT CONTRAST TECHNIQUE: Multidetector CT imaging of the head and cervical spine was performed following the standard protocol without intravenous contrast. Multiplanar CT image reconstructions of the cervical spine were also generated. COMPARISON:  Brain MRI dated 04/01/2015 and CT dated 04/01/2015 FINDINGS: CT HEAD FINDINGS There is slight prominence of the ventricles and sulci compatible with age-related volume loss. Mild periventricular and deep white matter hypodensities represent chronic microvascular ischemic changes. There is no intracranial hemorrhage. No mass effect or midline shift identified. There is partial as mentioned of the visualized right maxillary sinus. The remainder of the paranasal sinuses and mastoid air cells are well aerated. The calvarium is intact. CT CERVICAL SPINE FINDINGS There is no acute fracture or subluxation of the cervical spine.There multilevel degenerative changes of the spine most prominent at C5-C6 and C6-C7. There is endplate irregularity and disc space narrowing.The odontoid and spinous processes are intact.There is normal anatomic alignment of the C1-C2 lateral masses. The visualized soft tissues appear unremarkable. IMPRESSION: No acute intracranial hemorrhage. Mild age-related atrophy and chronic microvascular ischemic disease. No acute/traumatic cervical spine pathology. Electronically Signed   By: Anner Crete M.D.   On: 04/30/2015 21:16   Dg Hip Operative Unilat With Pelvis Left  05/23/2015  CLINICAL DATA:  Left hip fracture.  Initial encounter EXAM: OPERATIVE left HIP (WITH PELVIS IF PERFORMED) 2 VIEWS TECHNIQUE: Fluoroscopic spot image(s) were submitted for interpretation post-operatively. COMPARISON:  Radiography from  yesterday FINDINGS: The subcapital left femoral neck fracture has been transfixed with 3 screws. No evidence of screw articular surface transgression or femoral head collapse. Fracture alignment is similar to preoperative study. IMPRESSION: Fluoroscopy for femoral neck fracture ORIF. Electronically Signed   By: Monte Fantasia M.D.   On: 05/23/2015 17:27   Dg Hip Unilat With Pelvis 2-3 Views Left  05/22/2015  CLINICAL DATA:  Status post fall today. Severe left hip pain. Initial encounter. EXAM: DG HIP (WITH OR WITHOUT PELVIS) 2-3V LEFT COMPARISON:  Single view of the pelvis 04/01/2015. FINDINGS: The patient has an acute mildly impacted subcapital fracture of the left hip. No other acute bony or joint abnormality is identified. IMPRESSION: Acute subcapital fracture left hip. Electronically Signed   By: Inge Rise M.D.   On: 05/22/2015 20:55    Microbiology: Recent Results (from the past 240  hour(s))  Urine culture     Status: None   Collection Time: 05/22/15 10:02 PM  Result Value Ref Range Status   Specimen Description URINE, CATHETERIZED  Final   Special Requests NONE  Final   Culture   Final    NO GROWTH 1 DAY Performed at [facility]    Report Status 05/24/2015 FINAL  Final  Surgical pcr screen     Status: None   Collection Time: 05/23/15 12:17 AM  Result Value Ref Range Status   MRSA, PCR NEGATIVE NEGATIVE Final   Staphylococcus aureus NEGATIVE NEGATIVE Final    Comment:        The Xpert SA Assay (FDA approved for NASAL specimens in patients over 57 years of age), is one component of a comprehensive surveillance program.  Test performance has been validated by [facility] for patients greater than or equal to 16 year old. It is not intended to diagnose infection nor to guide or monitor treatment.      Labs: Basic Metabolic Panel:  Recent Labs Lab 05/22/15 2130 05/23/15 0651 05/24/15 0642 05/25/15 0656  NA 140 141 140 139  K 4.1 3.9 3.9 3.5  CL 102  108 107 103  CO2 27 28 26 27   GLUCOSE 143* 109* 90 88  BUN 22* 16 12 10   CREATININE 0.88 0.70 0.65 0.62  CALCIUM 9.4 8.8* 8.5* 8.8*   Liver Function Tests: No results for input(s): AST, ALT, ALKPHOS, BILITOT, PROT, ALBUMIN in the last 168 hours. No results for input(s): LIPASE, AMYLASE in the last 168 hours. No results for input(s): AMMONIA in the last 168 hours. CBC:  Recent Labs Lab 05/22/15 2130 05/23/15 0651 05/24/15 0642 05/25/15 0656  WBC 13.9* 6.4 7.2 8.5  NEUTROABS 10.9*  --   --   --   HGB 13.6 11.8* 11.1* 12.2  HCT 40.1 34.9* 33.0* 35.9*  MCV 94.1 94.1 94.0 93.7  PLT 173 149* 124* 138*   Cardiac Enzymes:  Recent Labs Lab 05/22/15 2130  TROPONINI <0.03   BNP: BNP (last 3 results) No results for input(s): BNP in the last 8760 hours.  ProBNP (last 3 results) No results for input(s): PROBNP in the last 8760 hours.  CBG:  Recent Labs Lab 05/23/15 1324  GLUCAP 84       Signed:  Faren Florence K  [facility] 05/25/2015, 4:16 PM

## 2015-05-26 ENCOUNTER — Encounter (HOSPITAL_COMMUNITY): Payer: Self-pay | Admitting: Orthopedic Surgery

## 2015-05-26 ENCOUNTER — Inpatient Hospital Stay
Admission: RE | Admit: 2015-05-26 | Discharge: 2015-06-19 | Disposition: A | Discharge status: Home / Self Care | Payer: Medicare Other | Source: Ambulatory Visit | Attending: Internal Medicine | Admitting: Internal Medicine

## 2015-05-26 LAB — TYPE AND SCREEN
ABO/RH(D): B POS
ANTIBODY SCREEN: NEGATIVE
UNIT DIVISION: 0
Unit division: 0

## 2015-05-26 LAB — CBC
HCT: 33.2 % — ABNORMAL LOW (ref 36.0–46.0)
HEMOGLOBIN: 11.4 g/dL — AB (ref 12.0–15.0)
MCH: 32.4 pg (ref 26.0–34.0)
MCHC: 34.3 g/dL (ref 30.0–36.0)
MCV: 94.3 fL (ref 78.0–100.0)
PLATELETS: 127 10*3/uL — AB (ref 150–400)
RBC: 3.52 MIL/uL — ABNORMAL LOW (ref 3.87–5.11)
RDW: 13.2 % (ref 11.5–15.5)
WBC: 6.3 10*3/uL (ref 4.0–10.5)

## 2015-05-26 MED ORDER — BISACODYL 10 MG RE SUPP
10.0000 mg | Freq: Once | RECTAL | Status: AC
  Administered 2015-05-26: 10 mg via RECTAL
  Filled 2015-05-26: qty 1

## 2015-05-26 NOTE — Progress Notes (Signed)
Report called to Gilmer Mor at [facility]. Patient ready for transport.

## 2015-05-26 NOTE — Clinical Social Work Placement (Signed)
   CLINICAL SOCIAL WORK PLACEMENT  NOTE  Date:  05/26/2015  Patient Details  Name: Catherine [name] MRN: [id number] Date of Birth: Dec 01, 1927  Clinical Social Work is seeking post-discharge placement for this patient at the [place] level of care (*CSW will initial, date and re-position this form in  chart as items are completed):  Yes   Patient/family provided with [place] Work Department's list of facilities offering this level of care within the geographic area requested by the patient (or if unable, by the patient's family).  Yes   Patient/family informed of their freedom to choose among providers that offer the needed level of care, that participate in Medicare, Medicaid or managed care program needed by the patient, have an available bed and are willing to accept the patient.  Yes   Patient/family informed of [facility]'s ownership interest in [facility] and [facility], as well as of the fact that they are under no obligation to receive care at these facilities.  PASRR submitted to EDS on       PASRR number received on       Existing PASRR number confirmed on 05/23/15     FL2 transmitted to all facilities in geographic area requested by pt/family on 05/23/15     FL2 transmitted to all facilities within larger geographic area on       Patient informed that his/her managed care company has contracts with or will negotiate with certain facilities, including the following:        Yes   Patient/family informed of bed offers received.  Patient chooses bed at [facility]     Physician recommends and patient chooses bed at      Patient to be transferred to [facility] on 05/26/15.  Patient to be transferred to facility by staff     Patient family notified on 05/26/15 of transfer.  Name of family member notified:  Ardyth Gal and Jenny Reichmann      PHYSICIAN       Additional Comment:     _______________________________________________ Salome Arnt, [place] 05/26/2015, 10:56 AM [phone]

## 2015-05-26 NOTE — Clinical Social Work Note (Signed)
Pt still requesting to return to [place] at d/c. [place] spoke with Tammy at facility who reports pt will need to go to SNF first. Pt aware and accepts bed at [facility]. Ardyth Gal and Jenny Reichmann also notified.   Benay Pike, [place]

## 2015-05-26 NOTE — Progress Notes (Signed)
Hip fracture instructions  Procedure open treatment internal fixation with CANNULATED SCREWS Weightbearing status as tolerated  Staples removed postop day #  12  Followup visit. Postop day # 28th  X-rays please have x-rays performed. Postop day # 28  DVT prophylaxis x 28 days

## 2015-05-26 NOTE — Progress Notes (Signed)
Physical Therapy Treatment Patient Details Name: Catherine [name] MRN: [id number] DOB: 1927/07/17 Today's Date: 05/26/2015    History of Present Illness Pt is a resident at an assistive living facility.  She was rushing to the phone and tripped and fell.  She is having therapy at the facility she was at due to decreasing ambulation ability.  PMH includes Parkinsons x2years.     PT Comments    Pt making some progress toward goals as seen in ability to perform additional volume of exercises in bed with better pain control, as well as attempt transfers to chair. Pt is still greatly limited mostly by pain, weakness, and some bradykinesia, but remains motivated to participate and try her best.   Follow Up Recommendations  SNF     Equipment Recommendations  None recommended by PT    Recommendations for Other Services       Precautions / Restrictions Precautions Precautions: None Precaution Comments: no score available.  Restrictions Weight Bearing Restrictions: Yes LLE Weight Bearing: Weight bearing as tolerated    Mobility  Bed Mobility Overal bed mobility: Needs Assistance Bed Mobility: Supine to Sit;Sit to Supine     Supine to sit: Max assist Sit to supine: Max assist   General bed mobility comments: poor trunk strength/control; unable to remain seated without assistance.   Transfers Overall transfer level: Needs assistance Equipment used: Rolling walker (2 wheeled) Transfers: Sit to/from Stand Sit to Stand: Max assist         General transfer comment: 2 attempts for 60 seconds; pt unable to actively bring hips over feet inspite of cues. Pt eports she cannot perform.   Ambulation/Gait                 Stairs            Wheelchair Mobility    Modified Rankin (Stroke Patients Only)       Balance Overall balance assessment: Needs assistance Sitting-balance support: Bilateral upper extremity supported;Feet supported Sitting balance-Leahy Scale:  Zero                              Cognition Arousal/Alertness: Awake/alert Behavior During Therapy: Flat affect (delayed response, typical of PD. ) Overall Cognitive Status: Difficult to assess                      Exercises General Exercises - Lower Extremity Ankle Circles/Pumps: Both;20 reps;Supine (max assist for full available range. ) Gluteal Sets: Both;10 reps;Supine (bridging inititation ) Short Arc Quad: Left;AAROM;10 reps;Supine Heel Slides: AAROM;Left;10 reps;Supine Hip ABduction/ADduction: AAROM;Left;10 reps;Supine    General Comments        Pertinent Vitals/Pain Pain Assessment: 0-10 Pain Score: 8  Pain Location: L hip  Pain Intervention(s): Limited activity within patient's tolerance;Monitored during session;Repositioned    Home Living                      Prior Function            PT Goals (current goals can now be found in the care plan section) Acute Rehab PT Goals PT Goal Formulation: With patient Time For Goal Achievement: 06/07/15 Potential to Achieve Goals: Fair Progress towards PT goals: Progressing toward goals    Frequency  Min 6X/week    PT Plan Current plan remains appropriate    Co-evaluation             End of  Session Equipment Utilized During Treatment: Gait belt Activity Tolerance: Patient limited by fatigue;Patient limited by pain (limited by freezing and bradykinesia) Patient left: in bed;with call bell/phone within reach;with bed alarm set     Time: [id number] PT Time Calculation (min) (ACUTE ONLY): 31 min  Charges:  $Therapeutic Exercise: 8-22 mins $Therapeutic Activity: 8-22 mins                    G Codes:      Buccola,Allan C 2015-06-14, 10:40 AM  10:42 AM  Etta Grandchild, PT, DPT [place] License # [id number]

## 2015-05-28 ENCOUNTER — Non-Acute Institutional Stay (SKILLED_NURSING_FACILITY): Payer: Medicare Other | Admitting: Internal Medicine

## 2015-05-28 DIAGNOSIS — G20 Parkinson's disease: Secondary | ICD-10-CM

## 2015-05-28 DIAGNOSIS — S72142D Displaced intertrochanteric fracture of left femur, subsequent encounter for closed fracture with routine healing: Secondary | ICD-10-CM

## 2015-05-28 DIAGNOSIS — E119 Type 2 diabetes mellitus without complications: Secondary | ICD-10-CM | POA: Diagnosis not present

## 2015-05-29 ENCOUNTER — Other Ambulatory Visit: Payer: Self-pay | Admitting: *Deleted

## 2015-05-29 MED ORDER — HYDROCODONE-ACETAMINOPHEN 5-325 MG PO TABS: 1.0000 | ORAL_TABLET | Freq: Four times a day (QID) | ORAL | Status: DC | PRN

## 2015-05-29 NOTE — Telephone Encounter (Signed)
[facility] 

## 2015-06-03 NOTE — Progress Notes (Addendum)
Patient ID: Catherine [name], female   DOB: 17-Jan-1928, 82 y.o.   MRN: [id number]                HISTORY & PHYSICAL  DATE:  05/28/2015         FACILITY: [facility]                       LEVEL OF CARE:   SNF   CHIEF COMPLAINT:  Admission to SNF, post stay at [facility], 05/22/2015 through 05/26/2015.    HISTORY OF PRESENT ILLNESS:  This is a patient who lives at [facility] assisted living in [place].  She apparently had a fall and suffered a left displaced femoral neck fracture.  She was taken to the OR on 05/23/2015 for an ORIF, which she tolerated well.  She is weightbearing as tolerated.  On aspirin for DVT prophylaxis.     The patient has Parkinson's disease.  She was here last time in August as the consequence of immobility secondary to Parkinson's disease.  I have reviewed my notes.  She had previously had a very bad intolerance to Sinemet.  Had been recommended for Mirapex by Dr. Jannifer Franklin.  I actually started this while she was here last time, really with good improvement.  Increased her dose to 0.25 three times a day.  However, looking at her records does not seem to demonstrate that she is currently on this.  She is, however, on amantadine 100 three times a day.    The patient states that she has had 2-3 falls in the last month.  I am not sure how accurate this is.  She used a walker at her assisted living.    PAST MEDICAL HISTORY/PROBLEM LIST:  Reviewed.      Tremor.    Parkinson's disease.    Sciatica.    Diabetes without complications.    Hyperlipidemia.    Migraines.    Hypertension.     Osteoarthritis.    Basal cell cancer of forehead, removed.    PAST SURGICAL HISTORY:   Reviewed.      Appendectomy.    Basal cell carcinoma resection.    Mouth surgery.    Tonsillectomy.    Cataract extraction.    Hip pinning on 05/23/2015.    CURRENT MEDICATIONS:  Medication list is reviewed.    Amantadine 100 three times a day.    ASA 325 daily.  I  think this is up from her usual 81 mg, probably due to DVT prophylaxis.    Colace 100 b.i.d.     Lasix 20 q.d.      Norco 5/325, 1-2 every 6 hours p.r.n.     Claritin 10 q.d.     MiraLAX 17 g daily.      K-Dur 20 mEq daily.      Ramipril 2.5 q.d.      Robitussin three times a day p.r.n.      Senokot 8.6 q.h.s.     Vitamin B12, 1000 mcg daily.    SOCIAL HISTORY:                   HOUSING:  The patient lives at [facility] assisted living [facility]).   FUNCTIONAL STATUS:  She uses a walker.  Beyond that, I know very little about her functional status.  She tells me that she does fall.    FAMILY HISTORY:   Reviewed.     FATHER:  Coronary artery disease.  Deceased.     MOTHER:  Rheumatoid arthritis.  Deceased.    SIBLINGS:  One brother deceased of a suicide.    REVIEW OF SYSTEMS:       GENERAL:  The patient states she feels reasonably well.     CHEST/RESPIRATORY:  No shortness of breath.   CARDIAC:  No chest pain.    GI:  No constipation.      GU:  No dysuria.    MUSCULOSKELETAL:  States her left hip pain is under control.   NEUROLOGICAL:  The patient does not seem bothered by her Parkinson's tremor.    PHYSICAL EXAMINATION:   GENERAL APPEARANCE:  The patient is bright, alert, obviously parkinsonian.    HEENT:   MOUTH/THROAT:   Dry mucous membranes.   CHEST/RESPIRATORY:  Exam is clear.       CARDIOVASCULAR:   CARDIAC:  Heart sounds are normal.  There are no murmurs.   GASTROINTESTINAL:   LIVER/SPLEEN/KIDNEY:   No liver, no spleen.  No tenderness.    GENITOURINARY:   BLADDER:  No suprapubic or costovertebral angle tenderness.    NEUROLOGICAL:  She has a coarse Parkinson's tremor of the right greater than left hand.  She has significant cogwheeling, especially in her right side, her leg and arm.   PSYCHIATRIC:   MENTAL STATUS:  She appears to be quite cognizant.  No evidence of major depression at this time, or delirium.     ASSESSMENT/PLAN:                Left  hip fracture.  She has a history of falls.  I wonder about her bone density status.    Parkinson's disease.  I had started her on Mirapex in August when she was here, titrating her to 0.25 t.i.d.  My impression at the time was that she did quite well on this.  I did not alter the amantadine.  She is clearly not on Mirapex now, nor was she on it when she was in the ER in December after a fall.  I am uncertain if there is another issue here.  Her Parkinson's disease is certainly not under good control.    Type 2 diabetes.  Not on current treatment.  Last hemoglobin A1c that I see in August was 6.3.    Hypertension.  On Ramipril.  Also on a small dose of potassium.  Last potassium I see from 05/25/2015 was 3.5.   She is on Lasix 20 mg, Ramipril 2.5.  There is some question in the notes about orthostatic hyotension

## 2015-06-04 ENCOUNTER — Encounter (HOSPITAL_COMMUNITY)
Admission: AD | Admit: 2015-06-04 | Discharge: 2015-06-04 | Disposition: A | Discharge status: Home / Self Care | Payer: Medicare Other | Source: Skilled Nursing Facility | Attending: Internal Medicine | Admitting: Internal Medicine

## 2015-06-04 DIAGNOSIS — I10 Essential (primary) hypertension: Secondary | ICD-10-CM | POA: Insufficient documentation

## 2015-06-04 LAB — CBC
HCT: 39.7 % (ref 36.0–46.0)
HEMOGLOBIN: 13.1 g/dL (ref 12.0–15.0)
MCH: 31.3 pg (ref 26.0–34.0)
MCHC: 33 g/dL (ref 30.0–36.0)
MCV: 95 fL (ref 78.0–100.0)
Platelets: 222 10*3/uL (ref 150–400)
RBC: 4.18 MIL/uL (ref 3.87–5.11)
RDW: 13.5 % (ref 11.5–15.5)
WBC: 5.7 10*3/uL (ref 4.0–10.5)

## 2015-06-04 LAB — BASIC METABOLIC PANEL
ANION GAP: 7 (ref 5–15)
BUN: 18 mg/dL (ref 6–20)
CALCIUM: 9.3 mg/dL (ref 8.9–10.3)
CO2: 30 mmol/L (ref 22–32)
Chloride: 106 mmol/L (ref 101–111)
Creatinine, Ser: 0.66 mg/dL (ref 0.44–1.00)
Glucose, Bld: 99 mg/dL (ref 65–99)
Potassium: 4.2 mmol/L (ref 3.5–5.1)
Sodium: 143 mmol/L (ref 135–145)

## 2015-06-14 ENCOUNTER — Non-Acute Institutional Stay (SKILLED_NURSING_FACILITY): Payer: Medicare Other | Admitting: Internal Medicine

## 2015-06-14 DIAGNOSIS — G20 Parkinson's disease: Secondary | ICD-10-CM

## 2015-06-14 DIAGNOSIS — R441 Visual hallucinations: Secondary | ICD-10-CM

## 2015-06-15 ENCOUNTER — Encounter (HOSPITAL_COMMUNITY)
Admission: RE | Admit: 2015-06-15 | Discharge: 2015-06-15 | Disposition: A | Discharge status: Home / Self Care | Payer: Medicare Other | Source: Skilled Nursing Facility | Attending: Internal Medicine | Admitting: Internal Medicine

## 2015-06-15 DIAGNOSIS — I10 Essential (primary) hypertension: Secondary | ICD-10-CM | POA: Insufficient documentation

## 2015-06-15 DIAGNOSIS — E119 Type 2 diabetes mellitus without complications: Secondary | ICD-10-CM | POA: Insufficient documentation

## 2015-06-15 LAB — URINALYSIS, ROUTINE W REFLEX MICROSCOPIC
Bilirubin Urine: NEGATIVE
GLUCOSE, UA: NEGATIVE mg/dL
HGB URINE DIPSTICK: NEGATIVE
Ketones, ur: NEGATIVE mg/dL
Nitrite: NEGATIVE
PH: 5.5 (ref 5.0–8.0)
Protein, ur: NEGATIVE mg/dL

## 2015-06-15 LAB — COMPREHENSIVE METABOLIC PANEL
ALBUMIN: 3.8 g/dL (ref 3.5–5.0)
ALT: 12 U/L — AB (ref 14–54)
AST: 14 U/L — AB (ref 15–41)
Alkaline Phosphatase: 77 U/L (ref 38–126)
Anion gap: 8 (ref 5–15)
BUN: 26 mg/dL — AB (ref 6–20)
CHLORIDE: 106 mmol/L (ref 101–111)
CO2: 28 mmol/L (ref 22–32)
Calcium: 9.6 mg/dL (ref 8.9–10.3)
Creatinine, Ser: 0.91 mg/dL (ref 0.44–1.00)
GFR calc Af Amer: >60 mL/min (ref >60)
GFR calc non Af Amer: 56 mL/min — ABNORMAL LOW (ref >60)
GLUCOSE: 95 mg/dL (ref 65–99)
POTASSIUM: 4.6 mmol/L (ref 3.5–5.1)
SODIUM: 142 mmol/L (ref 135–145)
Total Bilirubin: 0.5 mg/dL (ref 0.3–1.2)
Total Protein: 6.5 g/dL (ref 6.5–8.1)

## 2015-06-15 LAB — URINE MICROSCOPIC-ADD ON
BACTERIA UA: NONE SEEN
RBC / HPF: NONE SEEN RBC/hpf (ref 0–5)

## 2015-06-16 LAB — URINE CULTURE

## 2015-06-17 ENCOUNTER — Encounter: Payer: Self-pay | Admitting: Internal Medicine

## 2015-06-17 ENCOUNTER — Non-Acute Institutional Stay (SKILLED_NURSING_FACILITY): Payer: Medicare Other | Admitting: Internal Medicine

## 2015-06-17 DIAGNOSIS — G20 Parkinson's disease: Secondary | ICD-10-CM | POA: Diagnosis not present

## 2015-06-17 DIAGNOSIS — I10 Essential (primary) hypertension: Secondary | ICD-10-CM | POA: Diagnosis not present

## 2015-06-17 DIAGNOSIS — S72002D Fracture of unspecified part of neck of left femur, subsequent encounter for closed fracture with routine healing: Secondary | ICD-10-CM

## 2015-06-17 DIAGNOSIS — E119 Type 2 diabetes mellitus without complications: Secondary | ICD-10-CM

## 2015-06-17 NOTE — Progress Notes (Signed)
Patient ID: Catherine [name], female   DOB: [birth year], 82 y.o.   MRN: [id number]         This is a discharge note     FACILITY: [facility]:   SNF  Chief complaint-discharge note   HISTORY OF PRESENT ILLNESS:  This is a patient who lives at [facility] assisted living in [place].  She apparently had a fall and suffered a left displaced femoral neck fracture.  She was taken to the OR on 05/23/2015 for an ORIF, which she tolerated well.  She is weightbearing as tolerated.  On aspirin for DVT prophylaxis. Her stay here is been relatively unremarkable-she was seen recently for hallucinations by Dr. Marcelline Mates urine was ordered which appears to be nondiagnostic.  Dr. Dellia Nims also reduced her  Amantadine--  Apparently this hallucinations have not reoccurred-apparently when they did occur she was seeing worms in her food and she had agitation apparently this has resolved for now.  She has done well with therapy but would benefit from continued PT and OT.  She does have some history of parkinsonian symptoms and she is why she is on the amantadine.  She has no complaints today.  She is a type II diabetic Accu-Cheks have been 89 220.  Vital signs appear to be stable.  She will be going back to her assisted living facility  The patient has Parkinson's disease.  She was here last time in August as the consequence of immobility secondary to Parkinson's disease.  Marland Kitchen         PAST MEDICAL HISTORY/PROBLEM LIST:  Reviewed.      Tremor.    Parkinson's disease.    Sciatica.    Diabetes without complications.    Hyperlipidemia.    Migraines.    Hypertension.     Osteoarthritis.    Basal cell cancer of forehead, removed.    PAST SURGICAL HISTORY:   Reviewed.      Appendectomy.    Basal cell carcinoma resection.    Mouth surgery.    Tonsillectomy.    Cataract extraction.    Hip pinning on 05/23/2015.    CURRENT MEDICATIONS:   Medication list is reviewed.    Amantadine 100 two times a day.    ASA 325 daily.  I think this is up from her usual 81 mg, probably due to DVT prophylaxis.    Colace 100 b.i.d.     Lasix 20 q.d.      Norco 5/325, 1-2 every 6 hours p.r.n.     Claritin 10 q.d.     MiraLAX 17 g daily.      K-Dur 81mEq daily.      Ramipril 2.5 q.d.      Robitussin three times a day p.r.n.      Senokot 8.6 q.h.s.     Vitamin B12, 1000 mcg daily.    SOCIAL HISTORY:                   HOUSING:  The patient lives at [facility] assisted living [facility]).   FUNCTIONAL STATUS:  She uses a walker.  Beyond that, I know very little about her functional status.  She tells me that she does fall.    FAMILY HISTORY:   Reviewed.     FATHER:  Coronary artery disease.  Deceased.     MOTHER:  Rheumatoid arthritis.  Deceased.    SIBLINGS:  One brother deceased of a suicide.    REVIEW OF SYSTEMS:        GENERAL:  The patient states she feels  well. No fever or chills     CHEST/RESPIRATORY:  No shortness of breath.   CARDIAC:  No chest pain.    GI:  No constipation.      GU:  No dysuria.    MUSCULOSKELETAL:  States her left hip pain is under control.   NEUROLOGICAL:  The patient does not seem bothered by her Parkinson's tremor Psych again has not had a reoccurrence of her hallucinations.  .    PHYSICAL EXAMINATION:  Temperature 97.8 pulse 80 respirations 20 blood pressure 109/58  GENERAL APPEARANCE:  The patient is bright, alert, obviously parkinsonian.--With tremor most prominent right hand and generalized stiffness    HEENT:   MOUTH/THROAT: Pharynx is clear mucous membranes appear fairly moist today CHEST/RESPIRATORY:  Exam is clear.       CARDIOVASCULAR:   CARDIAC:  Heart sounds are normal.  There are no murmurs.   GASTROINTESTINAL:   LIVER/SPLEEN/KIDNEY:   No liver, no spleen.  No tenderness.      Skeletal skeletal is able to stand but appears to need assistance to stay standing she has  continued weakness.  Surgical site on her hip appears to be well-healed this is on the left     NEUROLOGICAL:  She has a coarse Parkinson's tremor of the right greater than left hand.  She has significant cogwheeling, especially in her right side, her leg and arm.   PSYCHIATRIC:   MENTAL STATUS:  She appears to be quite cognizant.  No evidence of major depression at this time, or delirium.  Labs.  06/15/2015.  Sodium 142 potassium 4.6 BUN 26 creatinine 0.91.  AST 14-ALT 12-otherwise liver function tests within normal limits  06/04/2015.  WBC 5.7 hemoglobin 13.1 platelets 222.       ASSESSMENT/PLAN:                Left hip fracture.  She has a history of falls.  Ill has continued weakness will need continued PT and OT for strengthening  --she has Norco as needed for pain but apparently is not really using this  Parkinson's disease.    Currently on amantadine 100 mg twice a day this was reduced secondary to concerns hallucinations-she had been on Mirapex started by Dr. Dellia Nims she is no longer on this although apparently appeared to do well with this-will defer follow-up to primary care provider  Type 2 diabetes.  Not on current treatment.  Last hemoglobin A1c that I see in August was 6.3.--CBGs have run 89 220   Hypertension.    She is on Lasix 20 mg, Ramipril 2.5. She's also potassium supplementation recent metabolic panel. To be unremarkable with potassium of 4.6 creatinine of 0.91-recent blood pressures-109/58-103/64-136/65-this appears to be fairly representative she is not complaining of hypotension during her stay here  [id number] note greater than 30 minutes spent on this discharge summary-greater than 50% of time spent coordinating plan of care for numerous diagnoses

## 2015-06-19 NOTE — Progress Notes (Addendum)
Patient ID: Catherine [name], female   DOB: [birth year], 82 y.o.   MRN: [id number]                PROGRESS NOTE  DATE:  06/14/2015         FACILITY: [facility]                LEVEL OF CARE:   SNF   Acute Visit          CHIEF COMPLAINT:  Hallucinations.      HISTORY OF PRESENT ILLNESS:  This is a patient whom I admitted to the building on 05/28/2015.  She had suffered a left displaced femoral neck fracture and was taken to the OR.    She lives in an assisted living in [place].    She has Parkinson's disease and takes amantadine.  I had followed some instructions during a previous admission here in August 2016 from Dr. Jannifer Franklin to initiate Mirapex, to which she had a nice improvement.  However, this was subsequently stopped, I think by her following neurologist.  I am not really sure what the issue was.    I was asked to see her today by the nurse on her floor.  Apparently, the patient was given lunch.  Sometime later, she verbalized that there were multiple different types of worms in her food.  She became quite agitated.  I am really not sure at the time of this dictation whether this is an isolated or an ongoing event that I simply did not know about.    PAST MEDICAL HISTORY/PROBLEM LIST:    Past Medical History  Diagnosis Date  . Tremor   . Paralysis agitans ([facility]) 09/20/2012  . Sciatica of left side   . Obese   . Diabetes mellitus without complication ([facility])   . Dyslipidemia   . Migraine without aura, without mention of intractable migraine without mention of status migrainosus   . Hypertension   . Arthritis   . Cancer ([facility])     basal cell-forehead  . Parkinson's disease ([facility])   .      PAST SURGICAL HISTORY:    Past Surgical History  Procedure Laterality Date  . Appendectomy    . Basal cell carcinoma resection      Forehead  . Mouth surgery    . Tonsillectomy    . Arthroscopic surgery      Left knee  . Trigger finger surgery      Bilateral thumb  .  Cataract extraction w/phaco Right 11/19/2013    Procedure: CATARACT EXTRACTION PHACO AND INTRAOCULAR LENS PLACEMENT (IOC);  Surgeon: Tonny Branch, MD;  Location: [facility];  Service: Ophthalmology;  Laterality: Right;  CDE:  12.95  . Cataract extraction w/phaco Left 12/17/2013    Procedure: CATARACT EXTRACTION PHACO AND INTRAOCULAR LENS PLACEMENT (IOC);  Surgeon: Tonny Branch, MD;  Location: [facility];  Service: Ophthalmology;  Laterality: Left;  CDE 12.10  . Hip pinning,cannulated Left 05/23/2015    Procedure: INTERNAL FIXATION LEFT HIP;  Surgeon: Carole Civil, MD;  Location: [facility];  Service: Orthopedics;  Laterality: Left;       CURRENT MEDICATIONS:  Medication list is reviewed.         Amantadine 100 mg three times a day.    ASA 81 q.d. and 325 daily.    Claritin 10 daily.    Colace 100 b.i.d.     Lasix 20 q.d.      MiraLAX 17 g q.h.s.  Norco 5/325, 2 tablets q.6 hours p.r.n.      KCl 10 mEq daily.     Ramipril 2.5 q.d.    Senokot 8.6 q.d.      REVIEW OF SYSTEMS:    HEENT:   Denies headache.     CHEST/RESPIRATORY:  No cough.  No sputum.    CARDIAC:  No chest pain.   GI:  No abdominal pain.   No nausea or vomiting.   GU:  No dysuria.    MUSCULOSKELETAL:  She denies any pain.   NEUROLOGICAL:  No complaints of focal weakness or numbness.     PHYSICAL EXAMINATION:   GENERAL APPEARANCE:   The patient is conversive.  Looks somewhat animated and restless.    CHEST/RESPIRATORY:  Clear air entry bilaterally.    CARDIOVASCULAR:   CARDIAC:  Heart sounds are normal.  There are no murmurs.   She appears to be euvolemic.    GASTROINTESTINAL:   ABDOMEN:  Soft, nontender.  No masses.     GENITOURINARY:   BLADDER:  No suprapubic or costovertebral angle tenderness.    NEUROLOGICAL:    MOTOR/TONE:  She has a resting tremor that is ameliorated by activity, although does not completely go away.  There is increased tone, compatible with cogwheeling.   SENSATION/STRENGTH:  She can move  her arms and her legs, antigravity and equally.   DEEP TENDON REFLEXES:  Reflexes are symmetric.   PSYCHIATRIC:   MENTAL STATUS:  She is able to tell me quite vividly what she saw coming out of the pork and [place] fries that she had for lunch.  She is denying any perceptual disturbances as of the moment.    ASSESSMENT/PLAN:             Hallucinations.  She has not had recent lab work, although on 01/25 she had a normal basic metabolic panel and CBC.  I will repeat this, along with a urine.  Of her medications, the most likely culprit here is amantadine which is not a particularly well tolerated drug in older people.  She is on this for her extrapyramidal issues, predominantly tremor I assume.  I am going to reduce the dose of the amantadine to 100 mg b.i.d.  Asked the staff to chart on this.     Parkinson's disease.  As noted, I am not exactly sure why the Mirapex was tapered and discontinued.  She seemed to have had a nice response to this in August.  I have not restarted this.

## 2015-06-20 ENCOUNTER — Ambulatory Visit: Payer: Medicare Other | Admitting: Orthopedic Surgery

## 2015-06-20 ENCOUNTER — Encounter (HOSPITAL_COMMUNITY)
Admission: AD | Admit: 2015-06-20 | Discharge: 2015-06-20 | Disposition: A | Discharge status: Home / Self Care | Payer: Medicare Other | Source: Skilled Nursing Facility | Attending: Internal Medicine | Admitting: Internal Medicine

## 2015-06-23 ENCOUNTER — Ambulatory Visit: Payer: Medicare Other | Admitting: Orthopedic Surgery

## 2015-06-23 ENCOUNTER — Telehealth: Payer: Self-pay

## 2015-06-23 NOTE — Telephone Encounter (Signed)
[facility] called to get information about the patients hip surgery.  Clarified that she had open fixation hip surgery.

## 2015-06-24 ENCOUNTER — Other Ambulatory Visit: Payer: Self-pay | Admitting: Orthopedic Surgery

## 2015-06-24 ENCOUNTER — Ambulatory Visit (HOSPITAL_COMMUNITY)
Admission: RE | Admit: 2015-06-24 | Discharge: 2015-06-24 | Disposition: A | Discharge status: Home / Self Care | Payer: Medicare Other | Source: Ambulatory Visit | Attending: Orthopedic Surgery | Admitting: Orthopedic Surgery

## 2015-06-24 DIAGNOSIS — S72002D Fracture of unspecified part of neck of left femur, subsequent encounter for closed fracture with routine healing: Secondary | ICD-10-CM | POA: Diagnosis not present

## 2015-06-24 DIAGNOSIS — T148XXA Other injury of unspecified body region, initial encounter: Secondary | ICD-10-CM

## 2015-06-29 ENCOUNTER — Emergency Department (HOSPITAL_COMMUNITY)
Admission: EM | Admit: 2015-06-29 | Discharge: 2015-06-29 | Disposition: A | Discharge status: Home / Self Care | Payer: Medicare Other | Attending: Emergency Medicine | Admitting: Emergency Medicine

## 2015-06-29 ENCOUNTER — Emergency Department (HOSPITAL_COMMUNITY): Payer: Medicare Other

## 2015-06-29 ENCOUNTER — Encounter (HOSPITAL_COMMUNITY): Payer: Self-pay

## 2015-06-29 DIAGNOSIS — Y998 Other external cause status: Secondary | ICD-10-CM | POA: Insufficient documentation

## 2015-06-29 DIAGNOSIS — G20 Parkinson's disease: Secondary | ICD-10-CM | POA: Diagnosis not present

## 2015-06-29 DIAGNOSIS — Y9301 Activity, walking, marching and hiking: Secondary | ICD-10-CM | POA: Insufficient documentation

## 2015-06-29 DIAGNOSIS — E785 Hyperlipidemia, unspecified: Secondary | ICD-10-CM | POA: Diagnosis not present

## 2015-06-29 DIAGNOSIS — Z79899 Other long term (current) drug therapy: Secondary | ICD-10-CM | POA: Insufficient documentation

## 2015-06-29 DIAGNOSIS — W01198A Fall on same level from slipping, tripping and stumbling with subsequent striking against other object, initial encounter: Secondary | ICD-10-CM | POA: Diagnosis not present

## 2015-06-29 DIAGNOSIS — S60811A Abrasion of right wrist, initial encounter: Secondary | ICD-10-CM | POA: Diagnosis not present

## 2015-06-29 DIAGNOSIS — E669 Obesity, unspecified: Secondary | ICD-10-CM | POA: Insufficient documentation

## 2015-06-29 DIAGNOSIS — S60511A Abrasion of right hand, initial encounter: Secondary | ICD-10-CM | POA: Diagnosis not present

## 2015-06-29 DIAGNOSIS — Z7982 Long term (current) use of aspirin: Secondary | ICD-10-CM | POA: Diagnosis not present

## 2015-06-29 DIAGNOSIS — I10 Essential (primary) hypertension: Secondary | ICD-10-CM | POA: Diagnosis not present

## 2015-06-29 DIAGNOSIS — S0990XA Unspecified injury of head, initial encounter: Secondary | ICD-10-CM | POA: Diagnosis present

## 2015-06-29 DIAGNOSIS — Y92129 Unspecified place in nursing home as the place of occurrence of the external cause: Secondary | ICD-10-CM | POA: Insufficient documentation

## 2015-06-29 DIAGNOSIS — E119 Type 2 diabetes mellitus without complications: Secondary | ICD-10-CM | POA: Insufficient documentation

## 2015-06-29 NOTE — ED Provider Notes (Signed)
CSN: [id number]     Arrival date & time 06/29/15  1226 History  By signing my name below, I, Catherine [name], attest that this documentation has been prepared under the direction and in the presence of Milton Ferguson, MD. Electronically Signed: Jolayne [name], Scribe. 06/29/2015. 12:54 PM.   Chief Complaint  Patient presents with  . Fall   Patient is a 82 y.o. female presenting with fall. The history is provided by the patient and the EMS personnel (pt complains of a fall). No language interpreter was used.  Fall This is a new problem. The current episode started 1 to 2 hours ago. The problem occurs constantly. The problem has not changed since onset.Pertinent negatives include no chest pain, no abdominal pain and no headaches. Nothing aggravates the symptoms. Nothing relieves the symptoms. She has tried nothing for the symptoms.    HPI Comments: Catherine [name] is a 82 y.o. female who is a resident of [facility], brought in to the ED by EMS, with a hx of left hip surgery, who presents to the Emergency Department complaining of abrasions to her left wrist and left and right hands s/p a mechanical fall earlier today where pt tripped while walking with her walker, struck her head on the wall and slid down the wall afterwards, no LOC. She reports that she takes baby aspirin daily.Pt denies general pain.   Past Medical History  Diagnosis Date  . Tremor   . Paralysis agitans ([place]) 09/20/2012  . Sciatica of left side   . Obese   . Diabetes mellitus without complication ([place])   . Dyslipidemia   . Migraine without aura, without mention of intractable migraine without mention of status migrainosus   . Hypertension   . Arthritis   . Cancer ([place])     basal cell-forehead  . Parkinson's disease [facility])    Past Surgical History  Procedure Laterality Date  . Appendectomy    . Basal cell carcinoma resection      Forehead  . Mouth surgery    . Tonsillectomy    . Arthroscopic surgery       Left knee  . Trigger finger surgery      Bilateral thumb  . Cataract extraction w/phaco Right 11/19/2013    Procedure: CATARACT EXTRACTION PHACO AND INTRAOCULAR LENS PLACEMENT (IOC);  Surgeon: Tonny Branch, MD;  Location: [facility];  Service: Ophthalmology;  Laterality: Right;  CDE:  12.95  . Cataract extraction w/phaco Left 12/17/2013    Procedure: CATARACT EXTRACTION PHACO AND INTRAOCULAR LENS PLACEMENT (IOC);  Surgeon: Tonny Branch, MD;  Location: [facility];  Service: Ophthalmology;  Laterality: Left;  CDE 12.10  . Hip pinning,cannulated Left 05/23/2015    Procedure: INTERNAL FIXATION LEFT HIP;  Surgeon: Carole Civil, MD;  Location: [facility] ORS;  Service: Orthopedics;  Laterality: Left;   Family History  Problem Relation Age of Onset  . Heart attack Father     not certain of COD  . Rheum arthritis Mother   . Hypertension Paternal Grandfather   . Stroke Paternal Grandfather   . Cirrhosis Paternal Grandmother    Social History  Substance Use Topics  . Smoking status: Never Smoker   . Smokeless tobacco: Never Used  . Alcohol Use: No   OB History    No data available     Review of Systems  Constitutional: Negative for appetite change and fatigue.  HENT: Negative for congestion, ear discharge and sinus pressure.   Eyes: Negative for discharge.  Respiratory: Negative for cough.   Cardiovascular: Negative for chest pain.  Gastrointestinal: Negative for abdominal pain and diarrhea.  Genitourinary: Negative for frequency and hematuria.  Musculoskeletal: Negative for back pain.  Skin: Positive for wound. Negative for rash.  Neurological: Negative for seizures and headaches.  Psychiatric/Behavioral: Negative for hallucinations.   Allergies  Sulfa antibiotics; Azilect; Fenofibrate; Sinemet; Tape; and Tomato  Home Medications   Prior to Admission medications   Medication Sig Start Date End Date Taking? Authorizing Provider  amantadine (SYMMETREL) 100 MG capsule Take 100 mg by mouth 2  (two) times daily.     Historical Provider, MD  aspirin EC 325 MG EC tablet Take 1 tablet (325 mg total) by mouth daily with breakfast. 05/25/15   Annita Brod, MD  benzonatate (TESSALON) 100 MG capsule Take 100 mg by mouth 2 (two) times daily. 2 week course for cough to be completed on 05/07/15    Historical Provider, MD  dextromethorphan-guaiFENesin (ROBITUSSIN COLD COUGH+ CHEST) 10-100 MG/5ML liquid Take 20 mLs by mouth 3 (three) times daily. Reported on 04/30/2015    Historical Provider, MD  diclofenac sodium (VOLTAREN) 1 % GEL Place 2 g onto the skin daily as needed (pain).  05/17/14   Historical Provider, MD  docusate sodium (COLACE) 100 MG capsule Take 1 capsule (100 mg total) by mouth 2 (two) times daily. 05/25/15   Annita Brod, MD  furosemide (LASIX) 20 MG tablet Take 20 mg by mouth daily.    Historical Provider, MD  HYDROcodone-acetaminophen (NORCO/VICODIN) 5-325 MG tablet Take 1-2 tablets by mouth every 6 (six) hours as needed for moderate pain. 05/29/15   Lauree Chandler, NP  loratadine (CLARITIN) 10 MG tablet Take 10 mg by mouth daily.    Historical Provider, MD  mineral oil liquid Place into both ears every Monday. Instill 2 drops in both ears once daily every Monday for Cerumen Impaction.    Historical Provider, MD  polyethylene glycol (MIRALAX / GLYCOLAX) packet Take 17 g by mouth daily as needed for mild constipation.    Historical Provider, MD  potassium chloride (K-DUR) 10 MEQ tablet Take 10 mEq by mouth daily.    Historical Provider, MD  ramipril (ALTACE) 2.5 MG capsule Take 2.5 mg by mouth daily.    Historical Provider, MD  senna (SENOKOT) 8.6 MG TABS tablet Take 1 tablet by mouth at bedtime.    Historical Provider, MD  vitamin B-12 (CYANOCOBALAMIN) 1000 MCG tablet Take 1,000 mcg by mouth daily.    Historical Provider, MD   BP 133/53 mmHg  Pulse 70  Temp(Src) 97.8 F (36.6 C) (Oral)  Resp 20  Ht 5' (1.524 m)  Wt 121 lb (54.885 kg)  BMI 23.63 kg/m2  SpO2  100% Physical Exam  Constitutional: She is oriented to person, place, and time. She appears well-developed.  HENT:  Head: Normocephalic.  Eyes: Conjunctivae and EOM are normal. No scleral icterus.  Neck: Neck supple. No thyromegaly present.  Cardiovascular: Normal rate and regular rhythm.  Exam reveals no gallop and no friction rub.   No murmur heard. Pulmonary/Chest: No stridor. She has no wheezes. She has no rales. She exhibits no tenderness.  Abdominal: She exhibits no distension. There is no tenderness. There is no rebound.  Musculoskeletal: Normal range of motion. She exhibits no edema.  Abrasions to pt's right hand and left wrist.   Lymphadenopathy:    She has no cervical adenopathy.  Neurological: She is oriented to person, place, and time. She exhibits normal muscle  tone. Coordination normal.  Skin: No rash noted. No erythema.  Psychiatric: She has a normal mood and affect. Her behavior is normal.    ED Course  Procedures  DIAGNOSTIC STUDIES:    Oxygen Saturation is 100% on RA, normal by my interpretation.   COORDINATION OF CARE:  12:56 PM Will order x ray of pt's hip. Will order CT scan of pt's head and cervical spine. Discussed treatment plan with pt at bedside and pt agreed to plan.   Imaging Review No results found. I have personally reviewed and evaluated these images as part of my medical decision-making.   MDM   Final diagnoses:  None   Fall without loc.  Ct head and neck neg.  Pt will follow up as needed   Milton Ferguson, MD 06/29/15 [phone]

## 2015-06-29 NOTE — ED Notes (Signed)
Patient with no complaints at this time. Respirations even and unlabored. Skin warm/dry. Discharge instructions reviewed with patient at this time. Patient given opportunity to voice concerns/ask questions. Patient discharged at this time and left Emergency Department via wheelchair. 

## 2015-06-29 NOTE — ED Notes (Signed)
EMS reports pt resident of [facility].  Reports tripped while walking with a walker.  Pt struck head on the wall and "slid" down the wall.  Reports no loss of consciousness.  EMS reports pt has skin tears to left wrist, left hand, and r hand.

## 2015-06-29 NOTE — ED Notes (Signed)
Skin tears to left forearm and right index finger cleaned and dressed with sterile non-adherent dressing.

## 2015-06-29 NOTE — Discharge Instructions (Signed)
Clean abrasions to arms twice a day with soap and water.  Follow up as needed

## 2015-07-01 ENCOUNTER — Ambulatory Visit: Payer: Medicare Other | Admitting: Orthopedic Surgery

## 2015-07-05 ENCOUNTER — Emergency Department (HOSPITAL_COMMUNITY): Payer: Medicare Other

## 2015-07-05 ENCOUNTER — Emergency Department (HOSPITAL_COMMUNITY)
Admission: EM | Admit: 2015-07-05 | Discharge: 2015-07-09 | Disposition: A | Discharge status: Other Acute Inpatient Hospital | Payer: Medicare Other | Attending: Emergency Medicine | Admitting: Emergency Medicine

## 2015-07-05 ENCOUNTER — Encounter (HOSPITAL_COMMUNITY): Payer: Self-pay | Admitting: *Deleted

## 2015-07-05 DIAGNOSIS — G20 Parkinson's disease: Secondary | ICD-10-CM | POA: Insufficient documentation

## 2015-07-05 DIAGNOSIS — R4182 Altered mental status, unspecified: Secondary | ICD-10-CM | POA: Diagnosis present

## 2015-07-05 DIAGNOSIS — I10 Essential (primary) hypertension: Secondary | ICD-10-CM | POA: Insufficient documentation

## 2015-07-05 DIAGNOSIS — E119 Type 2 diabetes mellitus without complications: Secondary | ICD-10-CM | POA: Diagnosis not present

## 2015-07-05 DIAGNOSIS — E669 Obesity, unspecified: Secondary | ICD-10-CM | POA: Diagnosis not present

## 2015-07-05 DIAGNOSIS — M1990 Unspecified osteoarthritis, unspecified site: Secondary | ICD-10-CM | POA: Insufficient documentation

## 2015-07-05 DIAGNOSIS — F22 Delusional disorders: Secondary | ICD-10-CM | POA: Diagnosis not present

## 2015-07-05 DIAGNOSIS — Z85828 Personal history of other malignant neoplasm of skin: Secondary | ICD-10-CM | POA: Diagnosis not present

## 2015-07-05 DIAGNOSIS — Z7982 Long term (current) use of aspirin: Secondary | ICD-10-CM | POA: Insufficient documentation

## 2015-07-05 DIAGNOSIS — Z79899 Other long term (current) drug therapy: Secondary | ICD-10-CM | POA: Diagnosis not present

## 2015-07-05 DIAGNOSIS — R410 Disorientation, unspecified: Secondary | ICD-10-CM | POA: Diagnosis not present

## 2015-07-05 LAB — URINALYSIS, ROUTINE W REFLEX MICROSCOPIC
BILIRUBIN URINE: NEGATIVE
Glucose, UA: NEGATIVE mg/dL
HGB URINE DIPSTICK: NEGATIVE
Leukocytes, UA: NEGATIVE
Nitrite: NEGATIVE
pH: 6 (ref 5.0–8.0)

## 2015-07-05 LAB — CBC WITH DIFFERENTIAL/PLATELET
BASOS ABS: 0 10*3/uL (ref 0.0–0.1)
BASOS PCT: 0 %
EOS ABS: 0 10*3/uL (ref 0.0–0.7)
EOS PCT: 0 %
HCT: 37.3 % (ref 36.0–46.0)
Hemoglobin: 12.6 g/dL (ref 12.0–15.0)
LYMPHS PCT: 9 %
Lymphs Abs: 0.7 10*3/uL (ref 0.7–4.0)
MCH: 32 pg (ref 26.0–34.0)
MCHC: 33.8 g/dL (ref 30.0–36.0)
MCV: 94.7 fL (ref 78.0–100.0)
Monocytes Absolute: 0.6 10*3/uL (ref 0.1–1.0)
Monocytes Relative: 8 %
Neutro Abs: 6.1 10*3/uL (ref 1.7–7.7)
Neutrophils Relative %: 83 %
PLATELETS: 159 10*3/uL (ref 150–400)
RBC: 3.94 MIL/uL (ref 3.87–5.11)
RDW: 14 % (ref 11.5–15.5)
WBC: 7.5 10*3/uL (ref 4.0–10.5)

## 2015-07-05 LAB — COMPREHENSIVE METABOLIC PANEL
ALK PHOS: 68 U/L (ref 38–126)
ALT: 15 U/L (ref 14–54)
ANION GAP: 10 (ref 5–15)
AST: 18 U/L (ref 15–41)
Albumin: 4.1 g/dL (ref 3.5–5.0)
BUN: 16 mg/dL (ref 6–20)
CO2: 27 mmol/L (ref 22–32)
Calcium: 9.1 mg/dL (ref 8.9–10.3)
Chloride: 108 mmol/L (ref 101–111)
Creatinine, Ser: 0.98 mg/dL (ref 0.44–1.00)
GFR calc non Af Amer: 51 mL/min — ABNORMAL LOW (ref >60)
GFR, EST AFRICAN AMERICAN: 59 mL/min — AB (ref >60)
Glucose, Bld: 106 mg/dL — ABNORMAL HIGH (ref 65–99)
Potassium: 4 mmol/L (ref 3.5–5.1)
SODIUM: 145 mmol/L (ref 135–145)
TOTAL PROTEIN: 6.7 g/dL (ref 6.5–8.1)
Total Bilirubin: 0.5 mg/dL (ref 0.3–1.2)

## 2015-07-05 LAB — TROPONIN I: Troponin I: <0.03 ng/mL (ref <0.031)

## 2015-07-05 LAB — URINE MICROSCOPIC-ADD ON

## 2015-07-05 LAB — ETHANOL

## 2015-07-05 LAB — LACTIC ACID, PLASMA: LACTIC ACID, VENOUS: 1.1 mmol/L (ref 0.5–2.0)

## 2015-07-05 MED ORDER — ASPIRIN EC 81 MG PO TBEC
81.0000 mg | DELAYED_RELEASE_TABLET | Freq: Every day | ORAL | Status: DC
  Administered 2015-07-05 – 2015-07-07 (×3): 81 mg via ORAL
  Filled 2015-07-05 (×4): qty 1

## 2015-07-05 MED ORDER — LORATADINE 10 MG PO TABS
10.0000 mg | ORAL_TABLET | Freq: Every day | ORAL | Status: DC
  Administered 2015-07-05 – 2015-07-07 (×3): 10 mg via ORAL
  Filled 2015-07-05 (×4): qty 1

## 2015-07-05 MED ORDER — AMANTADINE HCL 100 MG PO CAPS
100.0000 mg | ORAL_CAPSULE | Freq: Two times a day (BID) | ORAL | Status: DC
  Administered 2015-07-06 – 2015-07-07 (×4): 100 mg via ORAL
  Filled 2015-07-05 (×8): qty 1

## 2015-07-05 MED ORDER — SENNA 8.6 MG PO TABS
1.0000 | ORAL_TABLET | Freq: Every day | ORAL | Status: DC
  Administered 2015-07-06 – 2015-07-07 (×2): 8.6 mg via ORAL
  Filled 2015-07-05 (×4): qty 1

## 2015-07-05 MED ORDER — HYDROCODONE-ACETAMINOPHEN 5-325 MG PO TABS: 1.0000 | ORAL_TABLET | Freq: Four times a day (QID) | ORAL | Status: DC | PRN

## 2015-07-05 MED ORDER — VITAMIN B-12 1000 MCG PO TABS
1000.0000 ug | ORAL_TABLET | Freq: Every day | ORAL | Status: DC
  Administered 2015-07-06 – 2015-07-07 (×2): 1000 ug via ORAL
  Filled 2015-07-05 (×5): qty 1

## 2015-07-05 MED ORDER — RAMIPRIL 1.25 MG PO CAPS
2.5000 mg | ORAL_CAPSULE | Freq: Every day | ORAL | Status: DC
  Administered 2015-07-06 – 2015-07-07 (×2): 2.5 mg via ORAL
  Filled 2015-07-05 (×3): qty 1

## 2015-07-05 MED ORDER — DOCUSATE SODIUM 100 MG PO CAPS
100.0000 mg | ORAL_CAPSULE | Freq: Two times a day (BID) | ORAL | Status: DC
  Administered 2015-07-06 – 2015-07-07 (×4): 100 mg via ORAL
  Filled 2015-07-05 (×8): qty 1

## 2015-07-05 MED ORDER — NAPROXEN SODIUM 275 MG PO TABS
275.0000 mg | ORAL_TABLET | Freq: Two times a day (BID) | ORAL | Status: DC | PRN
  Filled 2015-07-05: qty 1

## 2015-07-05 MED ORDER — AMANTADINE HCL 100 MG PO CAPS
ORAL_CAPSULE | ORAL | Status: AC
  Filled 2015-07-05: qty 1

## 2015-07-05 MED ORDER — NAPROXEN SODIUM 220 MG PO TABS: 220.0000 mg | ORAL_TABLET | Freq: Two times a day (BID) | ORAL | Status: DC | PRN
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
## 2015-07-05 NOTE — ED Notes (Signed)
Patient assisted to restroom via wheelchair. Patient requires hands on assistance. Follows verbal cueing. Sitter at bedside.
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
## 2015-07-05 NOTE — Discharge Instructions (Signed)
°Emergency Department Resource Guide °1) Find a Doctor and Pay Out of Pocket °Although you won't have to find out who is covered by your insurance plan, it is a good idea to ask around and get recommendations. You will then need to call the office and see if the doctor you have chosen will accept you as a new patient and what types of options they offer for patients who are self-pay. Some doctors offer discounts or will set up payment plans for their patients who do not have insurance, but you will need to ask so you aren't surprised when you get to your appointment. ° °2) Contact Your Local Health Department °Not all health departments have doctors that can see patients for sick visits, but many do, so it is worth a call to see if yours does. If you don't know where your local health department is, you can check in your phone book. The CDC also has a tool to help you locate your state's health department, and many state websites also have listings of all of their local health departments. ° °3) Find a Walk-in Clinic °If your illness is not likely to be very severe or complicated, you may want to try a walk in clinic. These are popping up all over the country in pharmacies, drugstores, and shopping centers. They're usually staffed by nurse practitioners or physician assistants that have been trained to treat common illnesses and complaints. They're usually fairly quick and inexpensive. However, if you have serious medical issues or chronic medical problems, these are probably not your best option. ° °No Primary Care Doctor: °- Call [facility] at  832-8000 - they can help you locate a primary care doctor that  accepts your insurance, provides certain services, etc. °- Physician Referral Service- 1-800-533-3463 ° °Chronic Pain Problems: °Organization         Address  Phone   Notes  °[facility]  (336) 297-2271 Patients need to be referred by their primary care doctor.  ° °Medication  Assistance: °Organization         Address  Phone   Notes  °[place] [facility] [address] °[place], [place] [address] (336) 641-8030 --Must be a resident of [place] °-- Must have NO insurance coverage whatsoever (no Medicaid/ Medicare, etc.) °-- The pt. MUST have a primary care doctor that directs their care regularly and follows them in the community °  °[facility]  (866) 331-1348   °[employer]  (888) 892-1162   ° °Agencies that provide inexpensive medical care: °Organization         Address  Phone   Notes  °[facility]  (336) 832-8035   °[facility]    (336) 832-7272   °[facility] [address] °[place], [place] [address] (336) 832-4777   °[facility] [address], °[place] (336) 271-4999   °[facility]    (336) 373-0678   °[facility]    (336) 272-1050   °[facility] ° [address], [place] Phone:  (336) 832-4444, Fax:  (336) 832-4440 Hours of Operation:  9 am - 6 pm, M-F.  Also accepts Medicaid/Medicare and self-pay.  °[facility] ° [address], [place] Phone: (336) 832-3150, Fax: (336) [phone]. Hours of Operation:  8:30 am - 5:30 pm, M-F.  Also accepts Medicaid and self-pay.  °[facility] [place] [address], [place] Phone: (336) 878-6027   °[facility] [address], [place], [place] (336)723-1848, Ext. 123 Mondays & Thursdays: 7-9 AM.  First 15 patients are seen on a first come, first serve basis. °  ° °Medicaid-accepting [place] Providers: ° °Organization         Address  Phone   Notes  °[facility] [address], [place] (336) 641-2100 Also accepts self-pay patients.  °[facility] [address], [place] ° (336) 856-9996   °[facility] [address], [place]  (336) 288-8857   °[facility] [address] [place] [address], [place] (336) 299-7000   °Veita Bland [address], [place]  ° (336) 373-1557 Only accepts [place] Access Medicaid patients after they have their name applied to their card.  ° °Self-Pay (no insurance) in [place]: ° °Organization         Address  Phone   Notes  °Sickle Cell Patients, [facility] [address], [place] (336) 832-1970   °[facility] [address], [place] (336) 832-4400   °[facility] [place] ° [address], [place] (336) 992-4800   °[facility]/Dr. Osei-Bonsu ° [address] [place] [address], [place] or [address], [place] (336) 841-8500 Phone number for both [place] and [place] locations is the same.  °[facility] [address], [place] (336) 299-0000   °[facility] [address] [place] [address], [place] or [address] (336) 852-7530 °(336) 878-2260   °[facility] [address], [place] (336) 350-1642, phone; (336) 294-5005, fax Sees patients 1st and 3rd Saturday of every month.  Must not qualify for public or private insurance (i.e. Medicaid, Medicare, [place] Health Choice, Veterans' Benefits) • Household income should be no more than 200% of the poverty level •The clinic cannot treat you if you are pregnant or think you are pregnant • Sexually transmitted diseases are not treated at the clinic.  ° ° °Dental Care: °Organization         Address  Phone  Notes  °[place] [facility] [address],  (336) 641-6152 Accepts children up to age 21 who are enrolled in Medicaid or [place] Health Choice; pregnant women with a Medicaid card; and children who have applied for Medicaid or [place] Health Choice, but were declined, whose parents can pay a reduced fee at time of service.  °[place]  [facility] [place]  [address], [place] (336) 641-7733 Accepts children up to age 21 who are enrolled in Medicaid or [place] Health Choice; pregnant women with a Medicaid card; and children who have applied for Medicaid or [place] Health Choice, but were declined, whose parents can pay a reduced fee at time of service.  °[facility] ° [address],  (336) 641-4533 Patients are seen by appointment only. Walk-ins are not accepted. [facility] will see patients 18 years of age and older. °Monday - Tuesday (8am-5pm) °Most Wednesdays (8:30-5pm) °$30 per visit, cash only  °[facility] ° [address], [place] (336) 641-4533 Patients are seen by appointment only. Walk-ins are not accepted. [facility] will see patients 18 years of age and older. °One   Wednesday Evening (Monthly: Volunteer Based).  $30 per visit, cash only  °[facility]  (919) 537-3737 for adults; Children under age 4, call Graduate Pediatric Dentistry at ([phone]) [phone]. Children aged 4-14, please call ([phone]) [phone] to request a pediatric application. ° Dental services are provided in all areas of dental care including fillings, crowns and bridges, complete and partial dentures, implants, gum treatment, root canals, and extractions. Preventive care is also provided. Treatment is provided to both adults and children. °Patients are selected via a lottery and there is often a waiting list. °  °[facility] [address], °[place] ° (336) 763-8833 [email or web address] °  °[facility] [address], [place], [place] (336)723-1848, Ext. 123 Second and Fourth Thursday of each month, opens at 6:30 AM; Clinic ends at 9 AM.  Patients are seen on a first-come first-served basis, and a limited number are seen during each clinic.  ° °[facility] ° [address], [place], [place] (336) 723-7904    Eligibility Requirements °You must have lived in [place], [place], or [place] counties for at least the last three months. °  You cannot be eligible for state or federal sponsored healthcare insurance, including Veterans Administration, Medicaid, or Medicare. °  You generally cannot be eligible for healthcare insurance through your employer.  °  How to apply: °Eligibility screenings are held every Tuesday and Wednesday afternoon from 1:00 pm until 4:00 pm. You do not need an appointment for the interview!  °[facility] [address], [place], [place] 336-631-2330   °[facility]  336-342-8273   °[facility]  336-703-3100   °[facility]  336-570-6415   ° °Behavioral Health Resources in the Community: °Intensive Outpatient Programs °Organization         Address  Phone  Notes  °[facility] [address], [place], [place] 336-878-6098   °[facility] [address], [place], [place] 336-832-9800   °[facility] [address], [place], [place] ° 336-882-2125   °[facility] [address],  °[place], [place] 1-800-853-5163 or 336-641-4981   °Substance Abuse Resources °Organization         Address  Phone  Notes  °[facility]  336-882-2125   °[facility]  336-784-9470   °[facility]  336-285-9073   °[facility]  336-845-3988   °Residential & Outpatient Substance Abuse Program  1-800-659-3381   °Psychological Services °Organization         Address  Phone  Notes  °[facility]  336- 832-9600   °[facility]  336- 378-7881   °[facility] [address], [place] 1-800-853-5163 or 336-641-4981   ° °Mobile Crisis Teams °Organization         Address  Phone  Notes  °[facility], Mobile Crisis Care Unit  1-877-626-1772   °Assertive °[facility] ° [address]  [place], [place] 336-834-9664   °Sharon DeEsch [address] °[place] 336-554-5454   ° °Self-Help/Support Groups °Organization         Address  Phone             Notes  °[facility] - variety of support groups  336- 373-1402 Call for more information  °Narcotics Anonymous (NA), [facility] [address], °[place]  2 meetings at this location  ° °  Residential Treatment Programs °Organization         Address  Phone  Notes  °[facility] [address],    °[place]  1-866-801-8205   °[facility] ° [address], [place], [place] 704-293-8524   °[facility] [address], [place] 336-845-3988 Admissions: 8am-3pm M-F  °[facility] [address]    °[place], [place] 336-841-1104   °[facility] [address], [place], [place] 336-379-7146   °[facility] [address]  °[place], [place] 336-285-9073   °[facility] - Intensive Outpatient [address], [place], [place] 336-852-3033   °[facility] [address]  °[place], [place] 1-877-615-2722 or 336-784-9470   °[facility] [address] [place], [place] 336-227-7417 Accepts Medicaid  °[facility] [address]  °[place] 1-800-659-3381 Substance Abuse/Addiction Treatment  ° °[place] Behavioral Health Resources °Organization         Address  Phone  Notes  °[facility]  (888) 581-9988   °Julie Brannon, PhD [address] [place], [place]   (336) 349-5553 or (336) 951-0000   °[facility]   [address] °[place], [place] (336) 349-4454   °[facility] [address], [place], [place] (336) 342-8316 Insurance/Medicaid/sponsorship through [facility]  °[facility] [address]                                    [place], [place] (336) 342-8316 Therapy/tele-psych/case    °[facility] [address]  ° [place], [place] (336) 349-2233    °Dr. Arfeen  (336) 349-4544   °[facility] [place]  [employer] [place] [facility] 1) [address], [place] °2) [address], [place] °3)  [address], [place] (336) 349-3220 °(336) 342-7768 ° °(336) 342-8140   °[place] [facility] (336) 342-1394 or (336) 342-3537 (After Hours)    ° ° °Take your usual prescriptions as previously directed.  Call your regular medical doctor on Monday to schedule a follow up appointment within the next 2 days.  Return to the Emergency Department immediately sooner if worsening.  ° °
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
## 2015-07-06 MED ORDER — NAPROXEN 250 MG PO TABS
250.0000 mg | ORAL_TABLET | Freq: Two times a day (BID) | ORAL | Status: DC | PRN
  Administered 2015-07-07: 250 mg via ORAL
  Filled 2015-07-06: qty 1

## 2015-07-06 NOTE — ED Notes (Signed)
Pt moved to bed 17 with safety sitter.

## 2015-07-06 NOTE — ED Notes (Signed)
Emergency contact and healthcare POA, [employer]. Home # [phone], cell [phone]

## 2015-07-06 NOTE — Progress Notes (Signed)
Disposition CSW completed patient referrals to the following inpatient Gero-Psych facilities:  [facility]  CSW will follow up with patient for placement needs.   [place] Disposition CSW [phone]

## 2015-07-06 NOTE — ED Notes (Signed)
Patient sipped on Ginger Ale with assistance. Patient refuses to eat.

## 2015-07-06 NOTE — ED Notes (Signed)
Catherine [name] from [facility] (resident care coordinator) called and states that patient has had this problem before  when her meds were changed. She believes it is one of her parkinson's meds that was being changed or stopped that is causing problem.

## 2015-07-07 LAB — URINE CULTURE: Culture: NO GROWTH

## 2015-07-07 LAB — RAPID URINE DRUG SCREEN, HOSP PERFORMED
Amphetamines: NOT DETECTED
Barbiturates: NOT DETECTED
Benzodiazepines: NOT DETECTED
COCAINE: NOT DETECTED
OPIATES: NOT DETECTED
TETRAHYDROCANNABINOL: NOT DETECTED

## 2015-07-07 MED ORDER — ACETAMINOPHEN 325 MG PO TABS
650.0000 mg | ORAL_TABLET | Freq: Once | ORAL | Status: AC
  Administered 2015-07-07: 650 mg via ORAL
  Filled 2015-07-07: qty 2

## 2015-07-07 NOTE — ED Notes (Signed)
Dinner tray given to patient. Family assisted with dinner. Patient ate 25% of meal tray without difficulty.

## 2015-07-07 NOTE — ED Notes (Signed)
Patient ate 1 cheeseburger and drank approx. 71ml of juice. Tolerated well.

## 2015-07-07 NOTE — ED Notes (Signed)
Assisted patient with bedpan use. Patient voided 290ml of dark yellow urine. Depends changed and peri care performed with CNA at bedside.

## 2015-07-07 NOTE — ED Notes (Signed)
Family states to nurse patient will not eat or drink for them. Attempted to assist patient with food- patient refuss to eat with Probation officer.

## 2015-07-07 NOTE — ED Notes (Signed)
Eyes closed. Chest rise and fall even and unlabored. NAD noted.

## 2015-07-07 NOTE — ED Notes (Signed)
Patient had a complete bed bath @ 0630. Patients clothing placed in belongings bag.

## 2015-07-07 NOTE — ED Notes (Signed)
Pt drank approximately 4 oz of water at this time. Pt refusing food or additional drinks at this time.

## 2015-07-07 NOTE — ED Notes (Signed)
Pt resting with eyes closed, appears to be in no distress. Respirations are even and unlabored.  

## 2015-07-07 NOTE — ED Notes (Signed)
Patient with active hallucinations of police officers, snakes, and spiders in room. RN in room attempting to given medication to patient who would not cooperate with staff. States we are holding her hostage and not treating her right. Attempted multiple times to reorient pt without success. Pt eventually took medication in one spoonful of ice cream. Refusing to eat breakfast offered. Patient drank approximately 3 oz of water over the 45 minutes.

## 2015-07-07 NOTE — ED Notes (Signed)
Faxed POA paperwork that [employer]  had. Appears that he only has a notarized copy of the revocation of power of attorney. Behavioral Health notified that this is the only thing he has besides a letter from the lawyer stating he is currently POA as of 11/12/14. Faxed per Megan's request. Advised Mr Catherine [name] that he needs to contact the lawyer to be advised how to get the notarized copy of current POA paper and that he might need to go to the Records office at the [employer] for the county it was filed in.

## 2015-07-07 NOTE — BHH Counselor (Signed)
[facility] Assessment Progress Note  Attempted to call [facility] this afternoon to re-assess pt. There was no success in speaking to anyone. Nursing notes reviewed and writer consulted pt's case based on latest nursing note @ 10am that indicated pt was having active hallucinations and was not oriented. Per Elmarie Shiley, NP, pt still requires IP hospitalization. TTS to continue to seek placement.   Kenna Gilbert. Lovena Le, [place], [place], LPCA Counselor

## 2015-07-07 NOTE — Progress Notes (Signed)
[facility] called stating pt looks appropriate for admission, however, they must receive copy of POA paperwork prior to accepting pt. Pt's POA is neighbor [employer] [phone], [phone] (cell). Mr. Linus Salmons has copy of a letter from an attorney stating he is POA and also a letter from 2016 when pt revoked another individual as her POA, but states attorney has actual court paperwork. CSW faxed copies of letter to [facility] after leaving voicemail for intake explaining that POA is working to locate the notarized court documents needed. Will send to [facility] when available.   Sharren Bridge, MSW, LCSW Clinical Social Work, Disposition  07/07/2015 [phone]

## 2015-07-07 NOTE — ED Notes (Signed)
Family here at bedside.

## 2015-07-07 NOTE — ED Notes (Signed)
Patients skin feel warm to touch and redness noted to patients cheeks.

## 2015-07-07 NOTE — ED Notes (Signed)
Pt resting with eyes open, appears to be in no distress. Respirations are even and unlabored.  

## 2015-07-07 NOTE — ED Notes (Signed)
Family request to assist patient with lunch. Tray provided to family.

## 2015-07-08 ENCOUNTER — Ambulatory Visit: Payer: Medicare Other | Admitting: Orthopedic Surgery

## 2015-07-08 LAB — CBG MONITORING, ED
GLUCOSE-CAPILLARY: 76 mg/dL (ref 65–99)
Glucose-Capillary: 64 mg/dL — ABNORMAL LOW (ref 65–99)

## 2015-07-08 NOTE — Progress Notes (Signed)
Catherine [name] at [facility] informed Probation officer that he has been unable to get the accepting information for patient, that the patient is still accepted, and that he will be contacting TTS soon to provide that info. Phone numbers at [place] [facility] [place], [phone]) and [phone]) were given to [facility]. [facility] RN Darrick Penna and TTS office aware.  [name], [place] Disposition staff 07/08/2015 11:11 PM

## 2015-07-08 NOTE — Progress Notes (Signed)
POA paperwork received, faxed to [facility]. [facility] states it will be reviewed and unless anythuing further is needed, they will call when they have a d/c and pt can be transferred.   Kept copy of POA paperwork to have filed in pt's chart.  Sharren Bridge, MSW, LCSW Clinical Social Work, Disposition  07/08/2015 [phone]

## 2015-07-08 NOTE — ED Notes (Signed)
Pt difficult to wake up to take PO medications. Pt has not slept in over 40 hours. Pt will wake up to stimuli, but will then fall back asleep. Will attempt to give medications at a later time. VSS.

## 2015-07-08 NOTE — ED Notes (Addendum)
Pt has been sleeping all day. Pt will wake up with verbal stimuli, but almost immediately fall back asleep. Dr. Rogene Houston made aware.

## 2015-07-08 NOTE — Progress Notes (Signed)
Spoke with pt's POA (neighbor) Alla Feeling [phone]. States that he and wife are going to meet with their attorney this morning to get notarized copy of court POA documents. Will bring copy to [facility] so that it can be faxed to [facility]. Spoke with Stanton Kidney at [place]. She states it seems POA paperwork and subsequent consents for admission are all that is needed prior to accepting pt. CSW informed Cashlynn that copy of paperwork will be sent as soon as available.  Sharren Bridge, MSW, LCSW Clinical Social Work, Disposition  07/08/2015 [phone]

## 2015-07-08 NOTE — ED Notes (Signed)
IVC paperwork being completed and will be sent to magistrate. [facility] requesting IVC paperwork be faxed to [phone] after being served.

## 2015-07-08 NOTE — ED Notes (Addendum)
Gave pt orange juice. Dr. Rogene Houston notified.

## 2015-07-08 NOTE — ED Notes (Signed)
Patient given water, sip at a time, tolerated well

## 2015-07-08 NOTE — ED Notes (Signed)
POA paperwork faxed to [place] at [facility].

## 2015-07-08 NOTE — Progress Notes (Signed)
Received message from [place] at [place]- states POA paperwork does not specify Healthcare POA. [place], who states that he was under impression he does have Healthcare POA also, but is not able to confirm that based on paperwork available. Relayed info to [place], advised if no Healthcare POA can be identified, pt will need to be under IVC for admission.  Spoke with [facility]. Will update [facility] re: decision to IVC.  Sharren Bridge, MSW, LCSW Clinical Social Work, Disposition  07/08/2015 [phone]

## 2015-07-08 NOTE — ED Notes (Signed)
Turn patient slightly onto right side to change position in bed. Patient tolerated well.

## 2015-07-08 NOTE — Progress Notes (Addendum)
Emelly at [facility] informed that patient was accepted at [facility] and that IVC papers are pending. RN Margreta Journey aware. Please fax patient's IVC papers to [facility] at fax#: [phone].   [name], [place] Disposition staff 07/08/2015 4:12 PM

## 2015-07-08 NOTE — ED Notes (Signed)
Pt's neighbor (POA) arrived with POA paperwork from attorney's office. Will notify [facility] and fax paperwork to appropriate facilities.

## 2015-07-08 NOTE — Progress Notes (Addendum)
Patient's IVC papers were received by Probation officer and faxed to [facility].  Dub Mikes at [facility] confirmed that pt's IVC were received and requested that writer calls back at 23:00 for accepting information. RN aware.  [name], [place] Disposition staff 07/08/2015 9:28 PM

## 2015-07-09 NOTE — ED Notes (Signed)
Patient resting in bed at this time, eyes closed, even rise and fall of chest. NAD noted at this time

## 2015-07-09 NOTE — BHH Counselor (Addendum)
Spoke to [place] asking for [employer] [employer].  Stated they received only Petition.  Called & asked [facility] Darrick Penna) to re-fax to Intake [phone].    Boykin Nearing stated that pt could come anytime transportation can be arranged.    Call report in to [phone].  Notified [place] of above information.    Faylene Kurtz, MS, CRC, [place] Triage Specialist [facility]

## 2015-07-25 ENCOUNTER — Other Ambulatory Visit (HOSPITAL_COMMUNITY)
Admission: RE | Admit: 2015-07-25 | Discharge: 2015-07-25 | Disposition: A | Discharge status: Home / Self Care | Payer: Medicare Other | Source: Other Acute Inpatient Hospital | Attending: Nephrology | Admitting: Nephrology

## 2015-07-25 DIAGNOSIS — N390 Urinary tract infection, site not specified: Secondary | ICD-10-CM | POA: Insufficient documentation

## 2015-07-25 LAB — URINALYSIS, ROUTINE W REFLEX MICROSCOPIC
Bilirubin Urine: NEGATIVE
GLUCOSE, UA: NEGATIVE mg/dL
HGB URINE DIPSTICK: NEGATIVE
Ketones, ur: NEGATIVE mg/dL
LEUKOCYTES UA: NEGATIVE
Nitrite: NEGATIVE
PH: 5 (ref 5.0–8.0)
Protein, ur: NEGATIVE mg/dL
Specific Gravity, Urine: >1.03 — ABNORMAL HIGH (ref 1.005–1.030)

## 2015-07-27 LAB — URINE CULTURE

## 2015-08-08 ENCOUNTER — Encounter (HOSPITAL_COMMUNITY): Payer: Self-pay

## 2015-08-08 ENCOUNTER — Emergency Department (HOSPITAL_COMMUNITY): Payer: Medicare Other

## 2015-08-08 ENCOUNTER — Emergency Department (HOSPITAL_COMMUNITY)
Admission: EM | Admit: 2015-08-08 | Discharge: 2015-08-08 | Disposition: A | Discharge status: Home / Self Care | Payer: Medicare Other | Attending: Emergency Medicine | Admitting: Emergency Medicine

## 2015-08-08 ENCOUNTER — Telehealth: Payer: Self-pay | Admitting: Orthopedic Surgery

## 2015-08-08 DIAGNOSIS — Y9389 Activity, other specified: Secondary | ICD-10-CM | POA: Insufficient documentation

## 2015-08-08 DIAGNOSIS — I10 Essential (primary) hypertension: Secondary | ICD-10-CM | POA: Diagnosis not present

## 2015-08-08 DIAGNOSIS — M1990 Unspecified osteoarthritis, unspecified site: Secondary | ICD-10-CM | POA: Insufficient documentation

## 2015-08-08 DIAGNOSIS — Y929 Unspecified place or not applicable: Secondary | ICD-10-CM | POA: Insufficient documentation

## 2015-08-08 DIAGNOSIS — G20 Parkinson's disease: Secondary | ICD-10-CM | POA: Diagnosis not present

## 2015-08-08 DIAGNOSIS — W1839XA Other fall on same level, initial encounter: Secondary | ICD-10-CM | POA: Insufficient documentation

## 2015-08-08 DIAGNOSIS — S0990XA Unspecified injury of head, initial encounter: Secondary | ICD-10-CM | POA: Insufficient documentation

## 2015-08-08 DIAGNOSIS — W19XXXA Unspecified fall, initial encounter: Secondary | ICD-10-CM

## 2015-08-08 DIAGNOSIS — Z79899 Other long term (current) drug therapy: Secondary | ICD-10-CM | POA: Insufficient documentation

## 2015-08-08 DIAGNOSIS — Y999 Unspecified external cause status: Secondary | ICD-10-CM | POA: Diagnosis not present

## 2015-08-08 DIAGNOSIS — E119 Type 2 diabetes mellitus without complications: Secondary | ICD-10-CM | POA: Diagnosis not present

## 2015-08-08 NOTE — ED Notes (Signed)
Pt ambulated with walker to the bathroom without difficulty.  Pt does have poor safety awareness and is not aware of obstacles in her walker's path.

## 2015-08-08 NOTE — Telephone Encounter (Signed)
Levada Dy from [facility] called and scheduled an appointment for this patient.  She has been unable to come back in to see Dr. Aline Brochure due to other medical issues and having been in the hospital.  An appointment was offered for this coming week but it was stated that they could not bring her in until the week of April 17th.  Apppointment was given for Tuesday, April 18th at 11:50.  There was a question as to whether or not the pt needed an xray prior to the appointment.  Will you check this out and please call either [facility] [place] or Levada Dy at [facility] at [phone] and let them know if pt needs xray prior.

## 2015-08-08 NOTE — Discharge Instructions (Signed)

## 2015-08-08 NOTE — ED Notes (Signed)
Pt here from [facility] for evaluation of fall. Pt states she lost her balance and fell. Denies injury or pain.

## 2015-08-08 NOTE — ED Provider Notes (Signed)
CSN: [id number]     Arrival date & time 08/08/15  1136 History  By signing my name below, I, Terressa Koyanagi, attest that this documentation has been prepared under the direction and in the presence of No att. providers found. Electronically Signed: Terressa Koyanagi, ED Scribe. 08/08/2015. 12:02 PM.   Chief Complaint  Patient presents with  . Fall   HPI PCP: Chesley Noon, MD HPI Comments: Catherine [name] is a 82 y.o. female, with PMHx noted below and who uses a walker to ambulate at baseline, who presents to the Emergency Department from [facility] complaining of a fall sustained today when pt lost her balance and fell. She was trying to adjust her skirt when she lost her balance and fell onto her left side. Pt confirms head trauma reporting she hit her head on a cabinet door when she fell; pt denies LOC. Pt denies any pain including abd pain, hip pain, leg pain, neck pain, back pain or headache. Pt further denies dizziness or lightheadedness. Pt states she "feels good." Pt denies blood thinner use.   Past Medical History  Diagnosis Date  . Tremor   . Paralysis agitans ([place]) 09/20/2012  . Sciatica of left side   . Obese   . Diabetes mellitus without complication ([place])   . Dyslipidemia   . Migraine without aura, without mention of intractable migraine without mention of status migrainosus   . Hypertension   . Arthritis   . Parkinson's disease ([place])   . Cancer ([place])     basal cell-forehead   Past Surgical History  Procedure Laterality Date  . Appendectomy    . Basal cell carcinoma resection      Forehead  . Mouth surgery    . Tonsillectomy    . Arthroscopic surgery      Left knee  . Trigger finger surgery      Bilateral thumb  . Cataract extraction w/phaco Right 11/19/2013    Procedure: CATARACT EXTRACTION PHACO AND INTRAOCULAR LENS PLACEMENT (IOC);  Surgeon: Tonny Branch, MD;  Location: [facility];  Service: Ophthalmology;  Laterality: Right;  CDE:  12.95  . Cataract extraction w/phaco  Left 12/17/2013    Procedure: CATARACT EXTRACTION PHACO AND INTRAOCULAR LENS PLACEMENT (IOC);  Surgeon: Tonny Branch, MD;  Location: [facility];  Service: Ophthalmology;  Laterality: Left;  CDE 12.10  . Hip pinning,cannulated Left 05/23/2015    Procedure: INTERNAL FIXATION LEFT HIP;  Surgeon: Carole Civil, MD;  Location: [facility];  Service: Orthopedics;  Laterality: Left;   Family History  Problem Relation Age of Onset  . Heart attack Father     not certain of COD  . Rheum arthritis Mother   . Hypertension Paternal Grandfather   . Stroke Paternal Grandfather   . Cirrhosis Paternal Grandmother    Social History  Substance Use Topics  . Smoking status: Never Smoker   . Smokeless tobacco: Never Used  . Alcohol Use: No   OB History    No data available     Review of Systems  A complete 10 system review of systems was obtained and all systems are negative except as noted in the HPI and PMH.   Allergies  Sulfa antibiotics; Azilect; Fenofibrate; Sinemet; Tape; and Tomato  Home Medications   Prior to Admission medications   Medication Sig Start Date End Date Taking? Authorizing Provider  amantadine (SYMMETREL) 100 MG capsule Take 100 mg by mouth 3 (three) times daily.     Historical Provider, MD  aspirin EC  81 MG tablet Take 81 mg by mouth daily.    Historical Provider, MD  docusate sodium (COLACE) 100 MG capsule Take 1 capsule (100 mg total) by mouth 2 (two) times daily. 05/25/15   Annita Brod, MD  loratadine (CLARITIN) 10 MG tablet Take 10 mg by mouth daily.    Historical Provider, MD  mineral oil liquid Place into both ears every Monday. Instill 2 drops in both ears once daily every Monday for Cerumen Impaction.    Historical Provider, MD  ramipril (ALTACE) 2.5 MG capsule Take 2.5 mg by mouth daily.    Historical Provider, MD  risperiDONE (RISPERDAL) 0.25 MG tablet Take 0.25 mg by mouth at bedtime.    Historical Provider, MD  senna (SENOKOT) 8.6 MG TABS tablet Take 1 tablet by  mouth at bedtime.    Historical Provider, MD  vitamin B-12 (CYANOCOBALAMIN) 1000 MCG tablet Take 1,000 mcg by mouth daily.    Historical Provider, MD   Triage Vitals: BP 138/62 mmHg  Pulse 75  Resp 18  SpO2 99% Physical Exam  Constitutional: She is oriented to person, place, and time. She appears well-developed and well-nourished. No distress.  HENT:  Head: Normocephalic and atraumatic.  Mouth/Throat: Oropharynx is clear and moist. No oropharyngeal exudate.  Eyes: Conjunctivae and EOM are normal. Pupils are equal, round, and reactive to light.  Neck: Normal range of motion. Neck supple.  No meningismus.  Cardiovascular: Normal rate, regular rhythm, normal heart sounds and intact distal pulses.   No murmur heard. Pulmonary/Chest: Effort normal and breath sounds normal. No respiratory distress.  Abdominal: Soft. There is no tenderness. There is no rebound and no guarding.  Musculoskeletal: Normal range of motion. She exhibits no edema or tenderness.  FROM of hip without pain. No C spine tenderness.   Neurological: She is alert and oriented to person, place, and time. No cranial nerve deficit. She exhibits normal muscle tone. Coordination normal.  5/5 strength throughout. CN 2-12 intact.Equal grip strength. Sensation intact.  Resting tremor of right arm.   Skin: Skin is warm.  Abrasion to L shoulder  Psychiatric: She has a normal mood and affect. Her behavior is normal.  Nursing note and vitals reviewed.   ED Course  Procedures (including critical care time) DIAGNOSTIC STUDIES: Oxygen Saturation is 99% on ra, nl by my interpretation.    COORDINATION OF CARE: 12:02 PM: Discussed treatment plan which includes head CT with pt at bedside; patient verbalizes understanding and agrees with treatment plan.  Imaging Review Ct Head Wo Contrast  08/08/2015  CLINICAL DATA:  Fall today getting out of shower.  Hit back of head. EXAM: CT HEAD WITHOUT CONTRAST TECHNIQUE: Contiguous axial images  were obtained from the base of the skull through the vertex without intravenous contrast. COMPARISON:  07/05/2015 FINDINGS: Mild age related volume loss. No acute intracranial abnormality. Specifically, no hemorrhage, hydrocephalus, mass lesion, acute infarction, or significant intracranial injury. No acute calvarial abnormality. Visualized paranasal sinuses and mastoids clear. Orbital soft tissues unremarkable. IMPRESSION: No acute intracranial abnormality. Electronically Signed   By: Rolm Baptise M.D.   On: 08/08/2015 12:39   Dg Shoulder Left  08/08/2015  CLINICAL DATA:  Acute left shoulder pain.  Initial encounter. EXAM: LEFT SHOULDER - 2+ VIEW COMPARISON:  None. FINDINGS: There is no evidence of fracture or dislocation. There is no evidence of arthropathy or other focal bone abnormality. Soft tissues are unremarkable. IMPRESSION: Negative. Electronically Signed   By: Margarette Canada M.D.   On: 08/08/2015 13:45  I have personally reviewed and evaluated these images as part of my medical decision-making.  MDM   Final diagnoses:  Fall, initial encounter  Head injury, initial encounter   From nursing home after losing balance while adjusting her pants and falling. States she did hit her head but did not lose consciousness. No blood thinner use. Abrasion to left shoulder.  CT head negative. Shoulder x-ray negative.  Patient tolerating by mouth, she is ambulatory with her walker which is baseline. She is pleasant and cooperative. Neighbor at bedside states she appears to be at her baseline.  She appears stable for outpatient follow-up, no major injury from fall.   I personally performed the services described in this documentation, which was scribed in my presence. The recorded information has been reviewed and is accurate.    [name], MD 08/08/15 [phone]

## 2015-08-09 ENCOUNTER — Emergency Department (HOSPITAL_COMMUNITY)
Admission: EM | Admit: 2015-08-09 | Discharge: 2015-08-09 | Disposition: A | Discharge status: Home / Self Care | Payer: Medicare Other | Attending: Emergency Medicine | Admitting: Emergency Medicine

## 2015-08-09 ENCOUNTER — Encounter (HOSPITAL_COMMUNITY): Payer: Self-pay | Admitting: *Deleted

## 2015-08-09 ENCOUNTER — Emergency Department (HOSPITAL_COMMUNITY): Payer: Medicare Other

## 2015-08-09 DIAGNOSIS — E669 Obesity, unspecified: Secondary | ICD-10-CM | POA: Diagnosis not present

## 2015-08-09 DIAGNOSIS — Z79899 Other long term (current) drug therapy: Secondary | ICD-10-CM | POA: Insufficient documentation

## 2015-08-09 DIAGNOSIS — I10 Essential (primary) hypertension: Secondary | ICD-10-CM | POA: Insufficient documentation

## 2015-08-09 DIAGNOSIS — Y999 Unspecified external cause status: Secondary | ICD-10-CM | POA: Insufficient documentation

## 2015-08-09 DIAGNOSIS — W19XXXA Unspecified fall, initial encounter: Secondary | ICD-10-CM | POA: Diagnosis not present

## 2015-08-09 DIAGNOSIS — S40212A Abrasion of left shoulder, initial encounter: Secondary | ICD-10-CM | POA: Insufficient documentation

## 2015-08-09 DIAGNOSIS — Y929 Unspecified place or not applicable: Secondary | ICD-10-CM | POA: Insufficient documentation

## 2015-08-09 DIAGNOSIS — Y939 Activity, unspecified: Secondary | ICD-10-CM | POA: Insufficient documentation

## 2015-08-09 DIAGNOSIS — G20 Parkinson's disease: Secondary | ICD-10-CM | POA: Diagnosis not present

## 2015-08-09 DIAGNOSIS — S4992XA Unspecified injury of left shoulder and upper arm, initial encounter: Secondary | ICD-10-CM | POA: Diagnosis present

## 2015-08-09 DIAGNOSIS — E119 Type 2 diabetes mellitus without complications: Secondary | ICD-10-CM | POA: Diagnosis not present

## 2015-08-09 DIAGNOSIS — Z7982 Long term (current) use of aspirin: Secondary | ICD-10-CM | POA: Diagnosis not present

## 2015-08-09 LAB — BASIC METABOLIC PANEL
Anion gap: 9 (ref 5–15)
BUN: 17 mg/dL (ref 6–20)
CALCIUM: 8.8 mg/dL — AB (ref 8.9–10.3)
CO2: 26 mmol/L (ref 22–32)
Chloride: 106 mmol/L (ref 101–111)
Creatinine, Ser: 0.98 mg/dL (ref 0.44–1.00)
GFR calc Af Amer: 59 mL/min — ABNORMAL LOW (ref >60)
GFR, EST NON AFRICAN AMERICAN: 51 mL/min — AB (ref >60)
GLUCOSE: 120 mg/dL — AB (ref 65–99)
POTASSIUM: 3.9 mmol/L (ref 3.5–5.1)
SODIUM: 141 mmol/L (ref 135–145)

## 2015-08-09 LAB — CBC WITH DIFFERENTIAL/PLATELET
BASOS ABS: 0.1 10*3/uL (ref 0.0–0.1)
Basophils Relative: 1 %
EOS ABS: 0.1 10*3/uL (ref 0.0–0.7)
EOS PCT: 1 %
HCT: 35.6 % — ABNORMAL LOW (ref 36.0–46.0)
Hemoglobin: 12.1 g/dL (ref 12.0–15.0)
LYMPHS ABS: 0.9 10*3/uL (ref 0.7–4.0)
LYMPHS PCT: 9 %
MCH: 31.8 pg (ref 26.0–34.0)
MCHC: 34 g/dL (ref 30.0–36.0)
MCV: 93.4 fL (ref 78.0–100.0)
MONO ABS: 0.9 10*3/uL (ref 0.1–1.0)
Monocytes Relative: 8 %
Neutro Abs: 8.9 10*3/uL — ABNORMAL HIGH (ref 1.7–7.7)
Neutrophils Relative %: 81 %
PLATELETS: 220 10*3/uL (ref 150–400)
RBC: 3.81 MIL/uL — AB (ref 3.87–5.11)
RDW: 13.8 % (ref 11.5–15.5)
WBC: 10.8 10*3/uL — AB (ref 4.0–10.5)

## 2015-08-09 NOTE — ED Notes (Signed)
Pt ambulated with walker. Pt leaned to left side, per pt neighbor that pt baseline. EDP aware. Pt also tolerating PO fluids well. nad noted.

## 2015-08-09 NOTE — ED Notes (Signed)
Pt still refusing urine sample. Pt denies having to void.

## 2015-08-09 NOTE — ED Notes (Signed)
Rounded on patient. Pt refusing BP cuff. Pt aware urine sample needed. Pt reported " I don't want to give you one."

## 2015-08-09 NOTE — ED Notes (Signed)
Pt comes in from [place]. Pt has her neighbor at the bedside who states that Nanine Means called him because she fell again (Pt was seen here yesterday for a fall). Pt denies falling and states "They are lying, I was on my knees praying for the firefighters who helped my yesterday." Pt states her left hip does hurt.

## 2015-08-09 NOTE — ED Provider Notes (Signed)
CSN: [id number]     Arrival date & time 08/09/15  1002 History  By signing my name below, I, Catherine [name], attest that this documentation has been prepared under the direction and in the presence of Catherine Essex, MD. Electronically Signed: Virgel [name], ED Scribe. 08/09/2015. 1:54 PM.   Chief Complaint  Patient presents with  . Fall   The history is provided by the patient. No language interpreter was used.   HPI Comments: Catherine [name] is a 82 y.o. female brought in from EMS who presents to the Emergency Department after a fall that occurred yesterday. Patient reports that she fell yesterday and was on her knees this morning praying when she was found by her neighbor who called EMS. Her assisted living facility thought that she fell again today but she denies this. She endorses sore left hip pain that she associates with surgery and abrasions on her left shoulder. She was ambulatory after the fall. Denies falling today. Denies head trauma, LOC, back pain, dizziness currently, lightheadedness, or any other pains.  Past Medical History  Diagnosis Date  . Tremor   . Paralysis agitans ([place]) 09/20/2012  . Sciatica of left side   . Obese   . Diabetes mellitus without complication ([place])   . Dyslipidemia   . Migraine without aura, without mention of intractable migraine without mention of status migrainosus   . Hypertension   . Arthritis   . Parkinson's disease ([place])   . Cancer ([place])     basal cell-forehead   Past Surgical History  Procedure Laterality Date  . Appendectomy    . Basal cell carcinoma resection      Forehead  . Mouth surgery    . Tonsillectomy    . Arthroscopic surgery      Left knee  . Trigger finger surgery      Bilateral thumb  . Cataract extraction w/phaco Right 11/19/2013    Procedure: CATARACT EXTRACTION PHACO AND INTRAOCULAR LENS PLACEMENT (IOC);  Surgeon: Tonny Branch, MD;  Location: [facility];  Service: Ophthalmology;  Laterality: Right;  CDE:  12.95   . Cataract extraction w/phaco Left 12/17/2013    Procedure: CATARACT EXTRACTION PHACO AND INTRAOCULAR LENS PLACEMENT (IOC);  Surgeon: Tonny Branch, MD;  Location: [facility];  Service: Ophthalmology;  Laterality: Left;  CDE 12.10  . Hip pinning,cannulated Left 05/23/2015    Procedure: INTERNAL FIXATION LEFT HIP;  Surgeon: Carole Civil, MD;  Location: [facility];  Service: Orthopedics;  Laterality: Left;   Family History  Problem Relation Age of Onset  . Heart attack Father     not certain of COD  . Rheum arthritis Mother   . Hypertension Paternal Grandfather   . Stroke Paternal Grandfather   . Cirrhosis Paternal Grandmother    Social History  Substance Use Topics  . Smoking status: Never Smoker   . Smokeless tobacco: Never Used  . Alcohol Use: No   OB History    No data available     Review of Systems A complete 10 system review of systems was obtained and all systems are negative except as noted in the HPI and PMH.    Allergies  Sulfa antibiotics; Azilect; Fenofibrate; Sinemet; Tape; and Tomato  Home Medications   Prior to Admission medications   Medication Sig Start Date End Date Taking? Authorizing Provider  amantadine (SYMMETREL) 100 MG capsule Take 100 mg by mouth 3 (three) times daily.    Yes Historical Provider, MD  aspirin EC 81 MG tablet Take  81 mg by mouth daily.   Yes Historical Provider, MD  carbidopa-levodopa (SINEMET IR) 25-100 MG tablet Take 1 tablet by mouth daily.   Yes Historical Provider, MD  citalopram (CELEXA) 20 MG tablet Take 20 mg by mouth daily.   Yes Historical Provider, MD  diclofenac sodium (VOLTAREN) 1 % GEL Apply 2 g topically 4 (four) times daily as needed (joint damage/pain).   Yes Historical Provider, MD  docusate sodium (COLACE) 100 MG capsule Take 1 capsule (100 mg total) by mouth 2 (two) times daily. 05/25/15  Yes Annita Brod, MD  furosemide (LASIX) 20 MG tablet Take 20 mg by mouth.   Yes Historical Provider, MD  HYDROcodone-acetaminophen  (NORCO/VICODIN) 5-325 MG tablet Take 1 tablet by mouth every 6 (six) hours as needed for moderate pain.   Yes Historical Provider, MD  loratadine (CLARITIN) 10 MG tablet Take 10 mg by mouth daily.   Yes Historical Provider, MD  meloxicam (MOBIC) 7.5 MG tablet Take 7.5 mg by mouth daily.   Yes Historical Provider, MD  mineral oil liquid Place into both ears every Monday. Instill 2 drops in both ears once daily every Monday for Cerumen Impaction.   Yes Historical Provider, MD  Multiple Vitamin (MULTIVITAMIN) capsule Take 1 capsule by mouth daily.   Yes Historical Provider, MD  naproxen sodium (ANAPROX) 220 MG tablet Take 220 mg by mouth 2 (two) times daily as needed (pain).   Yes Historical Provider, MD  ondansetron (ZOFRAN) 4 MG tablet Take 4 mg by mouth every 8 (eight) hours as needed for nausea or vomiting.   Yes Historical Provider, MD  polyethylene glycol (MIRALAX / GLYCOLAX) packet Take 17 g by mouth daily as needed for mild constipation.   Yes Historical Provider, MD  potassium chloride (K-DUR) 10 MEQ tablet Take 10 mEq by mouth daily.   Yes Historical Provider, MD  ramipril (ALTACE) 2.5 MG capsule Take 2.5 mg by mouth daily.   Yes Historical Provider, MD  risperiDONE (RISPERDAL) 0.25 MG tablet Take 0.25 mg by mouth at bedtime.   Yes Historical Provider, MD  senna (SENOKOT) 8.6 MG TABS tablet Take 1 tablet by mouth at bedtime.   Yes Historical Provider, MD  traZODone (DESYREL) 50 MG tablet Take 50 mg by mouth at bedtime.   Yes Historical Provider, MD  vitamin B-12 (CYANOCOBALAMIN) 1000 MCG tablet Take 1,000 mcg by mouth daily.   Yes Historical Provider, MD   BP 137/57 mmHg  Pulse 80  Temp(Src) 98.5 F (36.9 C) (Oral)  Resp 14  Wt 120 lb (54.432 kg)  SpO2 96% Physical Exam  Constitutional: She is oriented to person, place, and time. She appears well-developed and well-nourished. No distress.  HENT:  Head: Normocephalic and atraumatic.  Mouth/Throat: Oropharynx is clear and moist. No  oropharyngeal exudate.  Eyes: Conjunctivae and EOM are normal. Pupils are equal, round, and reactive to light.  Neck: Normal range of motion. Neck supple.  No meningismus.  Cardiovascular: Normal rate, regular rhythm, normal heart sounds and intact distal pulses.   No murmur heard. Pulmonary/Chest: Effort normal and breath sounds normal. No respiratory distress. She exhibits no tenderness.  Abdominal: Soft. There is no tenderness. There is no rebound and no guarding.  Musculoskeletal: Normal range of motion. She exhibits no edema or tenderness.  No C-spine tenderness. Left lateral hip tenderness. No shortening or external rotation. Able to range without pain.  Neurological: She is alert and oriented to person, place, and time. No cranial nerve deficit. She exhibits normal  muscle tone. Coordination normal.  Resting tremor of right arm. Cranial nerves intact. 5/5 strength intact.  Skin: Skin is warm.  Old ecchymosis to left scapula. Abrasion to left shoulder.  Psychiatric: She has a normal mood and affect. Her behavior is normal.  Nursing note and vitals reviewed.   ED Course  Procedures   DIAGNOSTIC STUDIES: Oxygen Saturation is 100% on RA, normal by my interpretation.    COORDINATION OF CARE: 10:23 AM Will order x-rays of chest and hip, labs, and EKG. Discussed treatment plan with pt at bedside and pt agreed to plan.  12:10 PM Returned to reevaluate pt and discuss lab and imaging results. Patient has improved and is walking normally with walker. Discussed ordering urinalysis and pt refused this testing. Will discharge home.  Labs Review Labs Reviewed  CBC WITH DIFFERENTIAL/PLATELET - Abnormal; Notable for the following:    WBC 10.8 (*)    RBC 3.81 (*)    HCT 35.6 (*)    Neutro Abs 8.9 (*)    All other components within normal limits  BASIC METABOLIC PANEL - Abnormal; Notable for the following:    Glucose, Bld 120 (*)    Calcium 8.8 (*)    GFR calc non Af Amer 51 (*)    GFR  calc Af Amer 59 (*)    All other components within normal limits  URINALYSIS, ROUTINE W REFLEX MICROSCOPIC (NOT AT [facility])    Imaging Review Dg Chest 2 View  08/09/2015  CLINICAL DATA:  Fall yesterday.  Loss of consciousness. EXAM: CHEST  2 VIEW COMPARISON:  07/05/2015 FINDINGS: The heart size and mediastinal contours are within normal limits. Mild diffuse pulmonary interstitial prominence is stable. No evidence of acute infiltrate or pleural effusion. The visualized skeletal structures are unremarkable. IMPRESSION: No active cardiopulmonary disease. Electronically Signed   By: Earle Gell M.D.   On: 08/09/2015 11:21   Ct Head Wo Contrast  08/08/2015  CLINICAL DATA:  Fall today getting out of shower.  Hit back of head. EXAM: CT HEAD WITHOUT CONTRAST TECHNIQUE: Contiguous axial images were obtained from the base of the skull through the vertex without intravenous contrast. COMPARISON:  07/05/2015 FINDINGS: Mild age related volume loss. No acute intracranial abnormality. Specifically, no hemorrhage, hydrocephalus, mass lesion, acute infarction, or significant intracranial injury. No acute calvarial abnormality. Visualized paranasal sinuses and mastoids clear. Orbital soft tissues unremarkable. IMPRESSION: No acute intracranial abnormality. Electronically Signed   By: Rolm Baptise M.D.   On: 08/08/2015 12:39   Dg Shoulder Left  08/08/2015  CLINICAL DATA:  Acute left shoulder pain.  Initial encounter. EXAM: LEFT SHOULDER - 2+ VIEW COMPARISON:  None. FINDINGS: There is no evidence of fracture or dislocation. There is no evidence of arthropathy or other focal bone abnormality. Soft tissues are unremarkable. IMPRESSION: Negative. Electronically Signed   By: Margarette Canada M.D.   On: 08/08/2015 13:45   Dg Hip Unilat With Pelvis 2-3 Views Left  08/09/2015  CLINICAL DATA:  Fall yesterday.  Left hip pain.  Initial encounter. EXAM: DG HIP (WITH OR WITHOUT PELVIS) 2-3V LEFT COMPARISON:  06/29/2015 FINDINGS: Three  fixation screws are seen within the left hip. Subcapital left femoral neck fracture deformity is stable in appearance. No evidence of acute fracture or dislocation. No pelvic fracture identified. IMPRESSION: Internal fixation of prior left femoral neck fracture appears stable. No acute fracture or dislocation identified. Electronically Signed   By: Earle Gell M.D.   On: 08/09/2015 11:23   I have personally reviewed  and evaluated these images and lab results as part of my medical decision-making.   EKG Interpretation   Date/Time:  Saturday August 09 2015 11:05:39 EDT Ventricular Rate:  75 PR Interval:  160 QRS Duration: 79 QT Interval:  412 QTC Calculation: 460 R Axis:   1 Text Interpretation:  Sinus rhythm Low voltage, extremity and precordial  leads Abnormal R-wave progression, early transition No significant change  was found Confirmed by [place] [phone]) on 08/09/2015 11:42:45  AM      MDM   Final diagnoses:  Fall, initial encounter   Patient presents from assisted living after reported fall. She was seen yesterday by myself after a fall. She denies falling today and states she was just on her knees praying. Denies falling since she was seen yesterday. Denies head, neck, back, chest, abdominal pain. No dizziness or lightheadedness.  Xray of hip and chest negative. Screening labs at baseline.  Patient tolerating PO and ambulatory.  Refusing to give urine sample in the ED and refusing cath.   Patient appears to be at her baseline and stable to return to her facility.  BP 137/57 mmHg  Pulse 80  Temp(Src) 98.5 F (36.9 C) (Oral)  Resp 14  Wt 120 lb (54.432 kg)  SpO2 96%   I personally performed the services described in this documentation, which was scribed in my presence. The recorded information has been reviewed and is accurate.   Catherine Essex, MD 08/09/15 1356

## 2015-08-09 NOTE — ED Notes (Signed)
Report given to [facility] facility regarding pt disposition. Facility verbalized understanding.

## 2015-08-09 NOTE — ED Notes (Signed)
Per AM report, pt refusing EKG.

## 2015-08-09 NOTE — Discharge Instructions (Signed)

## 2015-08-12 ENCOUNTER — Other Ambulatory Visit (HOSPITAL_COMMUNITY)
Admission: RE | Admit: 2015-08-12 | Discharge: 2015-08-12 | Disposition: A | Discharge status: Home / Self Care | Payer: Medicare Other | Source: Skilled Nursing Facility | Attending: Vascular Surgery | Admitting: Vascular Surgery

## 2015-08-12 DIAGNOSIS — N390 Urinary tract infection, site not specified: Secondary | ICD-10-CM | POA: Insufficient documentation

## 2015-08-12 LAB — URINALYSIS, ROUTINE W REFLEX MICROSCOPIC
Bilirubin Urine: NEGATIVE
Glucose, UA: NEGATIVE mg/dL
Hgb urine dipstick: NEGATIVE
Ketones, ur: NEGATIVE mg/dL
Nitrite: NEGATIVE
Specific Gravity, Urine: >1.03 — ABNORMAL HIGH (ref 1.005–1.030)
pH: 6 (ref 5.0–8.0)

## 2015-08-12 LAB — URINE MICROSCOPIC-ADD ON: RBC / HPF: NONE SEEN RBC/hpf (ref 0–5)

## 2015-08-13 LAB — URINE CULTURE

## 2015-08-25 ENCOUNTER — Other Ambulatory Visit: Payer: Self-pay | Admitting: *Deleted

## 2015-08-25 DIAGNOSIS — S72142D Displaced intertrochanteric fracture of left femur, subsequent encounter for closed fracture with routine healing: Secondary | ICD-10-CM

## 2015-08-25 NOTE — Telephone Encounter (Signed)
Please return call to them and find out if she can ambulate on her own, if so we can do xray if not will be at hospital. I have tried to call multiple times and only get a busy signal. Thanks

## 2015-08-26 ENCOUNTER — Ambulatory Visit (HOSPITAL_COMMUNITY)
Admission: RE | Admit: 2015-08-26 | Discharge: 2015-08-26 | Disposition: A | Discharge status: Home / Self Care | Payer: Medicare Other | Source: Ambulatory Visit | Attending: Orthopedic Surgery | Admitting: Orthopedic Surgery

## 2015-08-26 ENCOUNTER — Ambulatory Visit (INDEPENDENT_AMBULATORY_CARE_PROVIDER_SITE_OTHER): Payer: Medicare Other | Admitting: Orthopedic Surgery

## 2015-08-26 VITALS — BP 157/74 | HR 64

## 2015-08-26 DIAGNOSIS — S72002D Fracture of unspecified part of neck of left femur, subsequent encounter for closed fracture with routine healing: Secondary | ICD-10-CM

## 2015-08-26 DIAGNOSIS — X58XXXD Exposure to other specified factors, subsequent encounter: Secondary | ICD-10-CM | POA: Insufficient documentation

## 2015-08-26 DIAGNOSIS — Z4789 Encounter for other orthopedic aftercare: Secondary | ICD-10-CM | POA: Diagnosis not present

## 2015-08-26 DIAGNOSIS — S72142D Displaced intertrochanteric fracture of left femur, subsequent encounter for closed fracture with routine healing: Secondary | ICD-10-CM

## 2015-08-26 NOTE — Progress Notes (Signed)
Chief Complaint  Patient presents with  . Follow-up    ER follow up fall s/p  left hip fracture DOS 05/23/15   Follow-up after open treatment internal fixation left hip with 3 cannulated screws this is the first postop visit for this patient did not see her because of scheduling issues transportation issues but she presents for postop visit #1. X-rays today show fracture fixation is in good position and the fracture has healed.  The patient has had some health issues over the last week none related to the fracture she is getting physical therapy and is making appropriate progress  Review of systems the patient has felt tired over the last week  Our examination reveals BP 157/74 mmHg  Pulse 64 Normal lengths of each leg no pain with hip flexion in either leg she is awake and alert although somewhat somnolent neurovascular exam to each lower extremity normal good pulse perfusion without edema in each leg  I reviewed the x-rays the fracture looks healed there is 3 screws is no hardware complication  I would like to see the patient in 3 months to check on her walking progress

## 2015-08-28 ENCOUNTER — Other Ambulatory Visit (HOSPITAL_COMMUNITY)
Admission: RE | Admit: 2015-08-28 | Discharge: 2015-08-28 | Disposition: A | Discharge status: Home / Self Care | Payer: Medicare Other | Source: Other Acute Inpatient Hospital | Attending: *Deleted | Admitting: *Deleted

## 2015-08-28 DIAGNOSIS — N390 Urinary tract infection, site not specified: Secondary | ICD-10-CM | POA: Insufficient documentation

## 2015-08-28 LAB — URINALYSIS, ROUTINE W REFLEX MICROSCOPIC
Bilirubin Urine: NEGATIVE
Glucose, UA: NEGATIVE mg/dL
Hgb urine dipstick: NEGATIVE
KETONES UR: NEGATIVE mg/dL
LEUKOCYTES UA: NEGATIVE
NITRITE: NEGATIVE
PH: 7 (ref 5.0–8.0)
Protein, ur: NEGATIVE mg/dL

## 2015-08-30 LAB — URINE CULTURE

## 2015-10-03 ENCOUNTER — Other Ambulatory Visit (HOSPITAL_COMMUNITY)
Admission: AD | Admit: 2015-10-03 | Discharge: 2015-10-03 | Disposition: A | Discharge status: Home / Self Care | Payer: Medicare Other | Source: Skilled Nursing Facility | Attending: *Deleted | Admitting: *Deleted

## 2015-10-03 DIAGNOSIS — N390 Urinary tract infection, site not specified: Secondary | ICD-10-CM | POA: Insufficient documentation

## 2015-10-03 LAB — URINALYSIS, ROUTINE W REFLEX MICROSCOPIC
BILIRUBIN URINE: NEGATIVE
Glucose, UA: NEGATIVE mg/dL
HGB URINE DIPSTICK: NEGATIVE
Ketones, ur: NEGATIVE mg/dL
Leukocytes, UA: NEGATIVE
Nitrite: NEGATIVE
PROTEIN: NEGATIVE mg/dL
Specific Gravity, Urine: >1.03 — ABNORMAL HIGH (ref 1.005–1.030)
pH: 5.5 (ref 5.0–8.0)

## 2015-10-05 LAB — URINE CULTURE

## 2015-10-20 ENCOUNTER — Encounter: Payer: Self-pay | Admitting: Adult Health

## 2015-10-20 ENCOUNTER — Ambulatory Visit (INDEPENDENT_AMBULATORY_CARE_PROVIDER_SITE_OTHER): Payer: Medicare Other | Admitting: Adult Health

## 2015-10-20 VITALS — BP 142/76 | HR 72 | Ht 60.0 in | Wt 116.4 lb

## 2015-10-20 DIAGNOSIS — R443 Hallucinations, unspecified: Secondary | ICD-10-CM | POA: Diagnosis not present

## 2015-10-20 DIAGNOSIS — G20 Parkinson's disease: Secondary | ICD-10-CM | POA: Diagnosis not present

## 2015-10-20 MED ORDER — CARBIDOPA-LEVODOPA 25-100 MG PO TABS: 0.5000 | ORAL_TABLET | Freq: Three times a day (TID) | ORAL | Status: DC

## 2015-10-20 NOTE — Patient Instructions (Addendum)
Decrease Sinemet to 1/2 tablet three times a day If your symptoms worsen or you develop new symptoms please let us know.

## 2015-10-20 NOTE — Progress Notes (Addendum)
PATIENT: Catherine [name] DOB: 12-20-1927  REASON FOR VISIT: follow up- Parkinson's disease HISTORY FROM: patient  HISTORY OF PRESENT ILLNESS: Catherine [name] is a 82 year old female with a history of Parkinson's disease. She has not followed up with our office in over 2 years. She returns today with her caregiver. Her caregiver states that their primary concern is hallucinations. She states that the patient has hallucinations throughout the day. She reports that is hard to engage in conversation due to the hallucinations. She reports that the patient broke her hip in December and then in January was hospitalized at [facility] due to hallucinations. She states at that time the patient thought her room was on fire. She also thought that candy in her room was yellow jackets. She reported words on the floor. The caregiver reports that there is any direct correlation with Sinemet in her hallucinations. She states that her primary care increase the Sinemet and her hallucinations increased. Once the Sinemet was reduced her hallucinations improved. She states that she continues to have hallucinations daily. The caregiver reports that her hallucinations are not violent or fearful. She states that she will see things or hear people talking. She states that the patient continues to use a walker when ambulating. She reports that she has a sitter this with her 24 7 and that has reduced her falls. She denies any trouble swallowing. She does have a tremor in the right hand. She returns today for an evaluation.  HISTORY 09/20/13 (WILLIS): Catherine [name] is an 82 year old right-handed white female with a history of Parkinson's disease primarily with right-sided features. Over the last year, she has gradually worsened with her parkinsonism. She indicates that she fell 2 weeks ago, and she was unable to get up by herself. She had to call a neighbor to help her get up on her feet again. She is not using a cane  for ambulation. She continues to have a prominent resting tremor on the right arm. She lives alone, and she is doing all of her activities of daily living, but she indicates that it is taking more time to complete these tasks. She denies any problems with choking with swallowing. In the past, the patient indicates that Azilect and Sinemet resulted in a skin rash. The patient comes to this office for further evaluation.  REVIEW OF SYSTEMS: Out of a complete 14 system review of symptoms, the patient complains only of the following symptoms, and all other reviewed systems are negative.  Appetite change, runny nose, light sensitivity, double vision, cough, constipation, joint pain, back pain, walking difficulty, daytime sleepiness, confusion, hallucinations, tremors, numbness, dizziness, bruise/bleed easily  ALLERGIES: Allergies  Allergen Reactions  . Sulfa Antibiotics Rash  . Azilect [Rasagiline] Rash  . Fenofibrate Rash  . Sinemet [Carbidopa-Levodopa] Rash  . Tape Rash and Other (See Comments)    Redness  . Tomato Rash    HOME MEDICATIONS: Outpatient Prescriptions Prior to Visit  Medication Sig Dispense Refill  . amantadine (SYMMETREL) 100 MG capsule Take 100 mg by mouth 3 (three) times daily.     Marland Kitchen aspirin EC 81 MG tablet Take 81 mg by mouth daily.    . diclofenac sodium (VOLTAREN) 1 % GEL Apply 2 g topically 4 (four) times daily as needed (joint damage/pain).    Marland Kitchen docusate sodium (COLACE) 100 MG capsule Take 1 capsule (100 mg total) by mouth 2 (two) times daily. 10 capsule 0  . furosemide (LASIX) 20 MG tablet Take 20 mg by  mouth.    Marland Kitchen HYDROcodone-acetaminophen (NORCO/VICODIN) 5-325 MG tablet Take 1 tablet by mouth every 6 (six) hours as needed for moderate pain.    . meloxicam (MOBIC) 7.5 MG tablet Take 7.5 mg by mouth daily.    . mineral oil liquid Place into both ears every Monday. Instill 2 drops in both ears once daily every Monday for Cerumen Impaction.    . Multiple Vitamin  (MULTIVITAMIN) capsule Take 1 capsule by mouth daily.    . naproxen sodium (ANAPROX) 220 MG tablet Take 220 mg by mouth 2 (two) times daily as needed (pain).    . ondansetron (ZOFRAN) 4 MG tablet Take 4 mg by mouth every 6 (six) hours as needed for nausea or vomiting.     . polyethylene glycol (MIRALAX / GLYCOLAX) packet Take 17 g by mouth daily as needed for mild constipation.    . potassium chloride (K-DUR) 10 MEQ tablet Take 10 mEq by mouth daily.    . ramipril (ALTACE) 2.5 MG capsule Take 2.5 mg by mouth daily.    . risperiDONE (RISPERDAL) 0.25 MG tablet Take 0.25 mg by mouth at bedtime.    . senna (SENOKOT) 8.6 MG TABS tablet Take 1 tablet by mouth at bedtime.    . traZODone (DESYREL) 50 MG tablet Take 50 mg by mouth at bedtime.    . vitamin B-12 (CYANOCOBALAMIN) 1000 MCG tablet Take 1,000 mcg by mouth daily.    . carbidopa-levodopa (SINEMET IR) 25-100 MG tablet Take 1 tablet by mouth 3 (three) times daily.     . citalopram (CELEXA) 20 MG tablet Take 20 mg by mouth daily. Reported on 08/26/2015    . loratadine (CLARITIN) 10 MG tablet Take 10 mg by mouth daily.     No facility-administered medications prior to visit.    PAST MEDICAL HISTORY: Past Medical History  Diagnosis Date  . Tremor   . Paralysis agitans ([place]) 09/20/2012  . Sciatica of left side   . Obese   . Diabetes mellitus without complication ([place])   . Dyslipidemia   . Migraine without aura, without mention of intractable migraine without mention of status migrainosus   . Hypertension   . Arthritis   . Parkinson's disease ([place])   . Cancer ([place])     basal cell-forehead    PAST SURGICAL HISTORY: Past Surgical History  Procedure Laterality Date  . Appendectomy    . Basal cell carcinoma resection      Forehead  . Mouth surgery    . Tonsillectomy    . Arthroscopic surgery      Left knee  . Trigger finger surgery      Bilateral thumb  . Cataract extraction w/phaco Right 11/19/2013    Procedure: CATARACT EXTRACTION  PHACO AND INTRAOCULAR LENS PLACEMENT (IOC);  Surgeon: Tonny Branch, MD;  Location: [facility];  Service: Ophthalmology;  Laterality: Right;  CDE:  12.95  . Cataract extraction w/phaco Left 12/17/2013    Procedure: CATARACT EXTRACTION PHACO AND INTRAOCULAR LENS PLACEMENT (IOC);  Surgeon: Tonny Branch, MD;  Location: [facility];  Service: Ophthalmology;  Laterality: Left;  CDE 12.10  . Hip pinning,cannulated Left 05/23/2015    Procedure: INTERNAL FIXATION LEFT HIP;  Surgeon: Carole Civil, MD;  Location: [facility];  Service: Orthopedics;  Laterality: Left;    FAMILY HISTORY: Family History  Problem Relation Age of Onset  . Heart attack Father     not certain of COD  . Rheum arthritis Mother   . Hypertension Paternal Grandfather   .  Stroke Paternal Grandfather   . Cirrhosis Paternal Grandmother     SOCIAL HISTORY: Social History   Social History  . Marital Status: Widowed    Spouse Name: N/A  . Number of Children: 0  . Years of Education: N/A   Occupational History  . Retired     Optometrist [employer]   Social History Main Topics  . Smoking status: Never Smoker   . Smokeless tobacco: Never Used  . Alcohol Use: No  . Drug Use: No  . Sexual Activity: Yes    Birth Control/ Protection: Post-menopausal   Other Topics Concern  . Not on file   Social History Narrative      PHYSICAL EXAM  Filed Vitals:   10/20/15 1410  BP: 142/76  Pulse: 72  Height: 5' (1.524 m)  Weight: 116 lb 6.4 oz (52.799 kg)   Body mass index is 22.73 kg/(m^2).  Generalized: Well developed, in no acute distress   Neurological examination  Mentation: Alert oriented to time, place, history taking. Follows all commands speech and language fluent. Patient does feel that there is a dog in the room with [place]. Often during conversation she begins to start talking out of context possibly due to hallucinations. Cranial nerve II-XII: Pupils were equal round reactive to light. Extraocular movements were full, visual field  were full on confrontational test. Facial sensation and strength were normal. Uvula tongue midline. Head turning and shoulder shrug  were normal and symmetric. Motor: The motor testing reveals 5 over 5 strength of all 4 extremities. Good symmetric motor tone is noted throughout. Resting tremor noted in right hand. Sensory: Sensory testing is intact to soft touch on all 4 extremities. No evidence of extinction is noted.  Coordination: Cerebellar testing reveals good finger-nose-finger and heel-to-shin bilaterally.  Gait and station: Patient is in a wheelchair today. Did not bring her walker.  Reflexes: Deep tendon reflexes are symmetric and normal bilaterally.   DIAGNOSTIC DATA (LABS, IMAGING, TESTING) - I reviewed patient records, labs, notes, testing and imaging myself where available.  Lab Results  Component Value Date   WBC 10.8* 08/09/2015   HGB 12.1 08/09/2015   HCT 35.6* 08/09/2015   MCV 93.4 08/09/2015   PLT 220 08/09/2015      Component Value Date/Time   NA 141 08/09/2015 1102   K 3.9 08/09/2015 1102   CL 106 08/09/2015 1102   CO2 26 08/09/2015 1102   GLUCOSE 120* 08/09/2015 1102   BUN 17 08/09/2015 1102   CREATININE 0.98 08/09/2015 1102   CALCIUM 8.8* 08/09/2015 1102   PROT 6.7 07/05/2015 1553   ALBUMIN 4.1 07/05/2015 1553   AST 18 07/05/2015 1553   ALT 15 07/05/2015 1553   ALKPHOS 68 07/05/2015 1553   BILITOT 0.5 07/05/2015 1553   GFRNONAA 51* 08/09/2015 1102   GFRAA 59* 08/09/2015 1102   Lab Results  Component Value Date   CHOL 161 12/22/2014   HDL 36* 12/22/2014   LDLCALC 110* 12/22/2014   TRIG 73 12/22/2014   CHOLHDL 4.5 12/22/2014   Lab Results  Component Value Date   HGBA1C 6.3* 12/21/2014        ASSESSMENT AND PLAN 83 y.o. year old female  has a past medical history of Tremor; Paralysis agitans ([place]) (09/20/2012); Sciatica of left side; Obese; Diabetes mellitus without complication ([place]); Dyslipidemia; Migraine without aura, without mention of  intractable migraine without mention of status migrainosus; Hypertension; Arthritis; Parkinson's disease ([place]); and Cancer ([place]). here with:  1. Parkinson's disease 2. Hallucinations  The caregiver feel that her hallucinations correlate with Initiation of sinement. I will decrease her Sinemet to half a tablet 3 times a day. I did advise that her Parkinson symptoms may get slightly worse with this decrease. She will need to be followed closely. The patient is already on Risperdal and Seroquel. She will follow-up in one to 2 months with Dr. Jannifer Franklin.  I consulted with Dr. Rexene Alberts regarding plan of care.      Ward Givens, MSN, NP-C 10/20/2015, 3:33 PM [facility] [address], [place], [place] [address] [phone]   I reviewed the above note and documentation by the Nurse Practitioner and agree with the history, physical exam, assessment and plan as outlined above. I was immediately available for face-to-face consultation. Star Age, MD, PhD [facility])

## 2015-11-25 ENCOUNTER — Ambulatory Visit (INDEPENDENT_AMBULATORY_CARE_PROVIDER_SITE_OTHER): Payer: Medicare Other | Admitting: Orthopedic Surgery

## 2015-11-25 VITALS — BP 148/69 | HR 74

## 2015-11-25 DIAGNOSIS — S72002D Fracture of unspecified part of neck of left femur, subsequent encounter for closed fracture with routine healing: Secondary | ICD-10-CM | POA: Diagnosis not present

## 2015-11-25 NOTE — Progress Notes (Signed)
Chief Complaint  Patient presents with  . Follow-up    Left hip   BP 148/69 mmHg  Pulse 74  DOS May 23 2015  Status post cannulated screws left hip. Patient has recovered well. Leg lengths are equal. Hip flexion normal. Patient discharge.

## 2015-12-25 ENCOUNTER — Encounter: Payer: Self-pay | Admitting: Neurology

## 2015-12-25 ENCOUNTER — Ambulatory Visit (INDEPENDENT_AMBULATORY_CARE_PROVIDER_SITE_OTHER): Payer: Medicare Other | Admitting: Neurology

## 2015-12-25 VITALS — BP 137/74 | HR 73 | Ht 60.0 in | Wt 116.0 lb

## 2015-12-25 DIAGNOSIS — F0280 Dementia in other diseases classified elsewhere without behavioral disturbance: Secondary | ICD-10-CM

## 2015-12-25 DIAGNOSIS — G20 Parkinson's disease: Secondary | ICD-10-CM | POA: Diagnosis not present

## 2015-12-25 HISTORY — DX: Dementia in other diseases classified elsewhere, unspecified severity, without behavioral disturbance, psychotic disturbance, mood disturbance, and anxiety: F02.80

## 2015-12-25 NOTE — Patient Instructions (Signed)
   We will stop the risperidone and the amantadine. Go up on the Seroquel to 25 mg in the morning and 75 mg in the evening. Keep the sinemet dose at 25/100 mg 1/2 tablet three times a day.

## 2015-12-25 NOTE — Progress Notes (Signed)
Reason for visit: Parkinson's disease, dementia  Catherine [name] is an [name] y.o. female  History of present illness:  Catherine [name] year old right-handed white female with a history of Parkinson's disease associated with a gait disorder and dementia. The patient has not been seen through this office since 2015, she was followed by Dr. Lily Lovings until recently. The patient required a hospitalization at [facility] secondary due to severe hallucinations and confusion. The patient has been taken down her Sinemet dose to the 25/100 mg tablets taking one half tablet 3 times daily. This has helped her confusion, but she has lost the ability to ambulate. She had been ambulatory with a walker. The patient more recently has had a urinary tract infection, she currently is on Cipro. The confusion has worsened around this time. The patient is on Seroquel taking 50 mg at night, 25 mg in the morning. She does not consistently sleep at night. She was also on Risperdal taking 0.25 mg at night. The patient takes trazodone 50 mg at night. She is also on amantadine taking 100 mg 3 times daily. The patient sometimes requires assistance with feeding, she requires assistance with transfers, bathing and dressing. She continues to hallucinate on a regular basis, but she does not have severe agitation. She has fallen on one occasion since last seen when she was in the shower. She returns for an evaluation.  Past Medical History:  Diagnosis Date  . Arthritis   . Cancer ([place])    basal cell-forehead  . Diabetes mellitus without complication ([place])   . Dyslipidemia   . Hypertension   . Migraine without aura, without mention of intractable migraine without mention of status migrainosus   . Obese   . Paralysis agitans ([place]) 09/20/2012  . Parkinson's disease ([place])   . Sciatica of left side   . Tremor     Past Surgical History:  Procedure Laterality Date  . APPENDECTOMY    . Arthroscopic surgery     Left knee  .  basal cell carcinoma resection     Forehead  . CATARACT EXTRACTION W/PHACO Right 11/19/2013   Procedure: CATARACT EXTRACTION PHACO AND INTRAOCULAR LENS PLACEMENT (IOC);  Surgeon: Tonny Branch, MD;  Location: [facility];  Service: Ophthalmology;  Laterality: Right;  CDE:  12.95  . CATARACT EXTRACTION W/PHACO Left 12/17/2013   Procedure: CATARACT EXTRACTION PHACO AND INTRAOCULAR LENS PLACEMENT (IOC);  Surgeon: Tonny Branch, MD;  Location: [facility];  Service: Ophthalmology;  Laterality: Left;  CDE 12.10  . HIP PINNING,CANNULATED Left 05/23/2015   Procedure: INTERNAL FIXATION LEFT HIP;  Surgeon: Carole Civil, MD;  Location: [facility];  Service: Orthopedics;  Laterality: Left;  . MOUTH SURGERY    . TONSILLECTOMY    . trigger finger surgery     Bilateral thumb    Family History  Problem Relation Age of Onset  . Rheum arthritis Mother   . Heart attack Father     not certain of COD  . Hypertension Paternal Grandfather   . Stroke Paternal Grandfather   . Cirrhosis Paternal Grandmother     Social history:  reports that she has never smoked. She has never used smokeless tobacco. She reports that she does not drink alcohol or use drugs.    Allergies  Allergen Reactions  . Sulfa Antibiotics Rash  . Azilect [Rasagiline] Rash  . Fenofibrate Rash  . Sinemet [Carbidopa-Levodopa] Rash  . Tape Rash and Other (See Comments)    Redness  . Tomato Rash  Medications:  Prior to Admission medications   Medication Sig Start Date End Date Taking? Authorizing Provider  amantadine (SYMMETREL) 100 MG capsule Take 100 mg by mouth 3 (three) times daily.    Yes Historical Provider, MD  aspirin EC 81 MG tablet Take 81 mg by mouth daily.   Yes Historical Provider, MD  carbidopa-levodopa (SINEMET IR) 25-100 MG tablet Take 0.5 tablets by mouth 3 (three) times daily. 10/20/15  Yes Ward Givens, NP  carboxymethylcellulose 1 % ophthalmic solution 1 drop 3 (three) times daily.   Yes Historical Provider, MD    ciprofloxacin (CIPRO) 250 MG tablet Take 250 mg by mouth 2 (two) times daily.   Yes Historical Provider, MD  docusate sodium (COLACE) 100 MG capsule Take 1 capsule (100 mg total) by mouth 2 (two) times daily. 05/25/15  Yes Annita Brod, MD  fexofenadine (ALLEGRA) 180 MG tablet Take 180 mg by mouth daily.   Yes Historical Provider, MD  furosemide (LASIX) 20 MG tablet Take 20 mg by mouth.   Yes Historical Provider, MD  LORazepam (ATIVAN) 0.5 MG tablet Take 0.5 mg by mouth 2 (two) times daily.   Yes Historical Provider, MD  meloxicam (MOBIC) 7.5 MG tablet Take 7.5 mg by mouth daily.   Yes Historical Provider, MD  mineral oil liquid Place into both ears every Monday. Instill 2 drops in both ears once daily every Monday for Cerumen Impaction.   Yes Historical Provider, MD  Multiple Vitamin (MULTIVITAMIN) capsule Take 1 capsule by mouth daily.   Yes Historical Provider, MD  naproxen sodium (ANAPROX) 220 MG tablet Take 220 mg by mouth 2 (two) times daily as needed (pain).   Yes Historical Provider, MD  ondansetron (ZOFRAN) 4 MG tablet Take 4 mg by mouth every 6 (six) hours as needed for nausea or vomiting.    Yes Historical Provider, MD  polyethylene glycol (MIRALAX / GLYCOLAX) packet Take 17 g by mouth daily as needed for mild constipation.   Yes Historical Provider, MD  potassium chloride (K-DUR) 10 MEQ tablet Take 10 mEq by mouth daily.   Yes Historical Provider, MD  QUEtiapine (SEROQUEL) 25 MG tablet 25 mg. Take one tab daily and two tabs at qhs.   Yes Historical Provider, MD  ramipril (ALTACE) 2.5 MG capsule Take 2.5 mg by mouth daily.   Yes Historical Provider, MD  risperiDONE (RISPERDAL) 0.25 MG tablet Take 0.25 mg by mouth at bedtime.   Yes Historical Provider, MD  senna (SENOKOT) 8.6 MG TABS tablet Take 1 tablet by mouth at bedtime.   Yes Historical Provider, MD  traZODone (DESYREL) 50 MG tablet Take 50 mg by mouth at bedtime.   Yes Historical Provider, MD    ROS:  Out of a complete 14  system review of symptoms, the patient complains only of the following symptoms, and all other reviewed systems are negative.  Speech difficulty, tremors Confusion, memory problems Hallucinations Insomnia  Blood pressure 137/74, pulse 73, height 5' (1.524 m), weight 116 lb (52.6 kg).  Physical Exam  General: The patient is alert, but she is quite confused at the time of examination.  Skin: No significant peripheral edema is noted.   Neurologic Exam  Mental status: The patient is alert, she is oriented to her name, she knows the month, not the year, she is not oriented to place. At times, she will babble incoherently.   Cranial nerves: Facial symmetry is present. Speech is normal, no aphasia or dysarthria is noted. Extraocular movements are full. Visual  fields are full.  Motor: The patient has good strength in all 4 extremities.  Sensory examination: Soft touch sensation is symmetric on the face, arms, and legs.  Coordination: The patient has good finger-nose-finger, but she has some apraxia with heel-to-shin bilaterally.  Gait and station: The patient cannot be ambulated. She can stand with assistance, but she has a tendency to lean backwards, she cannot take a step.  Reflexes: Deep tendon reflexes are symmetric.   Assessment/Plan:  1. Parkinson's disease  2. Dementia, hallucinations  3. Severe gait disorder  The patient is essentially end-stage with her Parkinson's disease. As the Sinemet has been reduced, her confusion and hallucinations have improved, but her ability to ambulate has significantly declined. Improvement of her mental state is likely more important in her mobility at this point. The Sinemet will be kept at the current dose. The amantadine will be discontinued, the Risperdal will be discontinued. The Seroquel can be increased to 75 mg at night to help sleep as well. If she does begin to sleep better, the trazodone and possibly the Ativan can be discontinued.  Simplification of the medication regimen is important at this time. I suppose that Nuplazid could be used in the future for confusion and hallucinations if needed. The patient will follow-up in 5 months.  Jill Alexanders MD 12/25/2015 12:18 PM  [facility] [address] [place], [place] [address]  Phone [phone] Fax [phone]

## 2015-12-28 ENCOUNTER — Encounter (HOSPITAL_COMMUNITY): Payer: Self-pay | Admitting: *Deleted

## 2015-12-28 ENCOUNTER — Emergency Department (HOSPITAL_COMMUNITY)
Admission: EM | Admit: 2015-12-28 | Discharge: 2015-12-28 | Disposition: A | Discharge status: Home / Self Care | Payer: Medicare Other | Attending: Emergency Medicine | Admitting: Emergency Medicine

## 2015-12-28 DIAGNOSIS — I10 Essential (primary) hypertension: Secondary | ICD-10-CM | POA: Diagnosis not present

## 2015-12-28 DIAGNOSIS — Z79899 Other long term (current) drug therapy: Secondary | ICD-10-CM | POA: Insufficient documentation

## 2015-12-28 DIAGNOSIS — G20 Parkinson's disease: Secondary | ICD-10-CM | POA: Insufficient documentation

## 2015-12-28 DIAGNOSIS — Z7982 Long term (current) use of aspirin: Secondary | ICD-10-CM | POA: Diagnosis not present

## 2015-12-28 DIAGNOSIS — R55 Syncope and collapse: Secondary | ICD-10-CM | POA: Diagnosis present

## 2015-12-28 DIAGNOSIS — E119 Type 2 diabetes mellitus without complications: Secondary | ICD-10-CM | POA: Insufficient documentation

## 2015-12-28 LAB — CBC
HEMATOCRIT: 36.8 % (ref 36.0–46.0)
HEMOGLOBIN: 12.5 g/dL (ref 12.0–15.0)
MCH: 32.4 pg (ref 26.0–34.0)
MCHC: 34 g/dL (ref 30.0–36.0)
MCV: 95.3 fL (ref 78.0–100.0)
Platelets: 165 10*3/uL (ref 150–400)
RBC: 3.86 MIL/uL — AB (ref 3.87–5.11)
RDW: 12.7 % (ref 11.5–15.5)
WBC: 6.2 10*3/uL (ref 4.0–10.5)

## 2015-12-28 LAB — CBG MONITORING, ED: GLUCOSE-CAPILLARY: 187 mg/dL — AB (ref 65–99)

## 2015-12-28 LAB — BASIC METABOLIC PANEL
ANION GAP: 5 (ref 5–15)
BUN: 20 mg/dL (ref 6–20)
CHLORIDE: 107 mmol/L (ref 101–111)
CO2: 24 mmol/L (ref 22–32)
Calcium: 8.4 mg/dL — ABNORMAL LOW (ref 8.9–10.3)
Creatinine, Ser: 1.25 mg/dL — ABNORMAL HIGH (ref 0.44–1.00)
GFR calc non Af Amer: 38 mL/min — ABNORMAL LOW (ref >60)
GFR, EST AFRICAN AMERICAN: 44 mL/min — AB (ref >60)
GLUCOSE: 172 mg/dL — AB (ref 65–99)
Potassium: 3.6 mmol/L (ref 3.5–5.1)
Sodium: 136 mmol/L (ref 135–145)

## 2015-12-28 LAB — TROPONIN I: Troponin I: <0.03 ng/mL (ref <0.03)

## 2015-12-28 MED ORDER — SODIUM CHLORIDE 0.9 % IV BOLUS (SEPSIS)
1000.0000 mL | Freq: Once | INTRAVENOUS | Status: AC
  Administered 2015-12-28: 1000 mL via INTRAVENOUS

## 2015-12-28 NOTE — ED Notes (Signed)
Dr Christy Gentles in to assess

## 2015-12-28 NOTE — ED Notes (Signed)
Orthostatic VS with lying and sitting- PT DOES NOT STAND, SHE IS WHEELCHAIR BOUND-

## 2015-12-28 NOTE — ED Notes (Signed)
Report to Lori, RN

## 2015-12-28 NOTE — ED Notes (Signed)
Sitter reports that pt has had a UTI and that her meds were changed Thursday to Cipro. Tonight, she went to the bathroom and had a syncopal episode

## 2015-12-28 NOTE — ED Notes (Signed)
CBG 187 

## 2015-12-28 NOTE — Discharge Instructions (Signed)
INCREASE YOUR FLUID INTAKE PLEASE RETURN FOR ANY NEW EPISODES OF PASSING OUT

## 2015-12-28 NOTE — ED Notes (Signed)
Pt on bedpan- small amount of urine and one small hard stool- pt has a rash to her lower abd and perineum. She has a dressing to a bedsore on her sacral area.

## 2015-12-28 NOTE — ED Notes (Signed)
Lab at bedside

## 2015-12-28 NOTE — ED Notes (Signed)
Report given to Lelan Pons, RN at [facility].

## 2015-12-28 NOTE — ED Provider Notes (Signed)
[facility] DEPT Provider Note   CSN: [id number] Arrival date & time: 12/28/15  0034     History   Chief Complaint Chief Complaint  Patient presents with  . Loss of Consciousness  LEVEL 5 CAVEAT DUE TO DEMENTIA   HPI Catherine [name] is a 82 y.o. female.  The history is provided by the patient and a caregiver. The history is limited by the condition of the patient.  Loss of Consciousness   This is a new problem. Episode onset: just prior to arrival. The problem has been gradually improving. She lost consciousness for a period of less than one minute. Associated symptoms include malaise/fatigue and vomiting.   Patient with h/o Parkinson and dementia presents with syncope Per caregiver, pt was at her baseline She went to the toilet, and while sitting on toilet she had episode of unresponsiveness for 30 seconds. She was spontaneously breathing but difficult to arouse.  No seizure activity No CPR was administered Since then she has reported nausea with some vomiting  Per caregiver, pt is at baseline mental status However she reports nausea/weakness upon trying to sit up in ER  She recently diagnosed with UTI and is on Cipro Also - pt recently had increase of seroquel at bedtime Past Medical History:  Diagnosis Date  . Arthritis   . Cancer ([name])    basal cell-forehead  . Dementia in Parkinson's disease ([name]) 12/25/2015  . Diabetes mellitus without complication ([name])   . Dyslipidemia   . Hypertension   . Migraine without aura, without mention of intractable migraine without mention of status migrainosus   . Obese   . Paralysis agitans ([name]) 09/20/2012  . Parkinson's disease ([name])   . Sciatica of left side   . Tremor     Patient Active Problem List   Diagnosis Date Noted  . Dementia in Parkinson's disease ([name]) 12/25/2015  . Parkinson's disease ([name]) 05/24/2015  . 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 Left displaced femoral neck fracture ([name]) 05/23/2015  . Closed  left hip fracture ([name])   . Hip fracture ([name]) 05/22/2015  . Weakness 12/22/2014  . Ataxia 12/22/2014  . PAT (paroxysmal atrial tachycardia) ([place]) 01/25/2013  . Palpitations 01/11/2013  . Paralysis agitans ([name]) 09/20/2012  . Type 2 diabetes mellitus ([name]) 07/21/2011  . Hyperlipidemia 07/21/2011    Past Surgical History:  Procedure Laterality Date  . APPENDECTOMY    . Arthroscopic surgery     Left knee  . basal cell carcinoma resection     Forehead  . CATARACT EXTRACTION W/PHACO Right 11/19/2013   Procedure: CATARACT EXTRACTION PHACO AND INTRAOCULAR LENS PLACEMENT (IOC);  Surgeon: Tonny Branch, MD;  Location: [facility];  Service: Ophthalmology;  Laterality: Right;  CDE:  12.95  . CATARACT EXTRACTION W/PHACO Left 12/17/2013   Procedure: CATARACT EXTRACTION PHACO AND INTRAOCULAR LENS PLACEMENT (IOC);  Surgeon: Tonny Branch, MD;  Location: [facility];  Service: Ophthalmology;  Laterality: Left;  CDE 12.10  . HIP PINNING,CANNULATED Left 05/23/2015   Procedure: INTERNAL FIXATION LEFT HIP;  Surgeon: Carole Civil, MD;  Location: [facility];  Service: Orthopedics;  Laterality: Left;  . MOUTH SURGERY    . TONSILLECTOMY    . trigger finger surgery     Bilateral thumb    OB History    No data available       Home Medications    Prior to Admission medications   Medication Sig Start Date End Date Taking? Authorizing Provider  aspirin EC 81 MG tablet Take 81  mg by mouth daily.    Historical Provider, MD  carbidopa-levodopa (SINEMET IR) 25-100 MG tablet Take 0.5 tablets by mouth 3 (three) times daily. 10/20/15   Ward Givens, NP  carboxymethylcellulose 1 % ophthalmic solution 1 drop 3 (three) times daily.    Historical Provider, MD  ciprofloxacin (CIPRO) 250 MG tablet Take 250 mg by mouth 2 (two) times daily.    Historical Provider, MD  docusate sodium (COLACE) 100 MG capsule Take 1 capsule (100 mg total) by mouth 2 (two) times daily. 05/25/15   Annita Brod, MD  fexofenadine (ALLEGRA) 180 MG  tablet Take 180 mg by mouth daily.    Historical Provider, MD  furosemide (LASIX) 20 MG tablet Take 20 mg by mouth.    Historical Provider, MD  LORazepam (ATIVAN) 0.5 MG tablet Take 0.5 mg by mouth 2 (two) times daily.    Historical Provider, MD  meloxicam (MOBIC) 7.5 MG tablet Take 7.5 mg by mouth daily.    Historical Provider, MD  mineral oil liquid Place into both ears every Monday. Instill 2 drops in both ears once daily every Monday for Cerumen Impaction.    Historical Provider, MD  Multiple Vitamin (MULTIVITAMIN) capsule Take 1 capsule by mouth daily.    Historical Provider, MD  naproxen sodium (ANAPROX) 220 MG tablet Take 220 mg by mouth 2 (two) times daily as needed (pain).    Historical Provider, MD  ondansetron (ZOFRAN) 4 MG tablet Take 4 mg by mouth every 6 (six) hours as needed for nausea or vomiting.     Historical Provider, MD  polyethylene glycol (MIRALAX / GLYCOLAX) packet Take 17 g by mouth daily as needed for mild constipation.    Historical Provider, MD  potassium chloride (K-DUR) 10 MEQ tablet Take 10 mEq by mouth daily.    Historical Provider, MD  QUEtiapine (SEROQUEL) 25 MG tablet Take 25 mg by mouth. Take one tab daily and three tabs at qhs.     Historical Provider, MD  ramipril (ALTACE) 2.5 MG capsule Take 2.5 mg by mouth daily.    Historical Provider, MD  senna (SENOKOT) 8.6 MG TABS tablet Take 1 tablet by mouth at bedtime.    Historical Provider, MD  traZODone (DESYREL) 50 MG tablet Take 50 mg by mouth at bedtime.    Historical Provider, MD    Family History Family History  Problem Relation Age of Onset  . Rheum arthritis Mother   . Heart attack Father     not certain of COD  . Hypertension Paternal Grandfather   . Stroke Paternal Grandfather   . Cirrhosis Paternal Grandmother     Social History Social History  Substance Use Topics  . Smoking status: Never Smoker  . Smokeless tobacco: Never Used  . Alcohol use No     Allergies   Sulfa antibiotics;  Azilect [rasagiline]; Fenofibrate; Sinemet [carbidopa-levodopa]; Tape; and Tomato   Review of Systems Review of Systems  Unable to perform ROS: Dementia  Constitutional: Positive for malaise/fatigue.  Cardiovascular: Positive for syncope.  Gastrointestinal: Positive for vomiting.     Physical Exam Updated Vital Signs BP (!) 127/54   Pulse 72   Temp 97.4 F (36.3 C) (Oral)   Resp 14   Ht 5' (1.524 m)   Wt 52.2 kg   SpO2 96%   BMI 22.46 kg/m   Physical Exam CONSTITUTIONAL: Elderly, frail HEAD: Normocephalic/atraumatic EYES: EOMI ENMT: Mucous membranes moist NECK: supple no meningeal signs SPINE/BACK:entire spine nontender CV: S1/S2 noted, no murmurs/rubs/gallops noted  LUNGS: Lungs are clear to auscultation bilaterally, no apparent distress ABDOMEN: soft, nontender NEURO: Pt is sleeping but easily arousable.  She follows commands and will speak incoherently at times.  No focal weakness noted EXTREMITIES: pulses normal/equal, full ROM SKIN: warm, color normal PSYCH: unable to assess   ED Treatments / Results  Labs (all labs ordered are listed, but only abnormal results are displayed) Labs Reviewed  BASIC METABOLIC PANEL - Abnormal; Notable for the following:       Result Value   Glucose, Bld 172 (*)    Creatinine, Ser 1.25 (*)    Calcium 8.4 (*)    GFR calc non Af Amer 38 (*)    GFR calc Af Amer 44 (*)    All other components within normal limits  CBC - Abnormal; Notable for the following:    RBC 3.86 (*)    All other components within normal limits  CBG MONITORING, ED - Abnormal; Notable for the following:    Glucose-Capillary 187 (*)    All other components within normal limits  TROPONIN I    EKG  EKG Interpretation  Date/Time:  Sunday December 28 2015 01:04:54 EDT Ventricular Rate:  64 PR Interval:    QRS Duration: 94 QT Interval:  438 QTC Calculation: 452 R Axis:   -2 Text Interpretation:  Sinus rhythm Low voltage, precordial leads Abnormal R-wave  progression, early transition No significant change since last tracing Confirmed by Christy Gentles  MD, Carli Lefevers ([id number]) on 12/28/2015 1:17:28 AM       Radiology No results found.  Procedures Procedures (including critical care time)  Medications Ordered in ED Medications  sodium chloride 0.9 % bolus 1,000 mL (1,000 mLs Intravenous New Bag/Given 12/28/15 0325)     Initial Impression / Assessment and Plan / ED Course  I have reviewed the triage vital signs and the nursing notes.  Pertinent labs  results that were available during my care of the patient were reviewed by me and considered in my medical decision making (see chart for details).  Clinical Course    4:17 AM Pt here from facility for syncopal episode She had hypotension here upon sitting up IV fluids ordered This could be related to recent seroquel increase 5:18 AM Caregiver reports pt at baseline She is awake/alert, no distress She appears improved This could have represented a vasovagal episode while on toilet No dysrhythmia on EKG Also, her hypotension earlier could be med related but improved with IV fluids I doubt other acute cardiopulmonary emergency at this time She is safe for d/c back to facility BP 131/62   Pulse 74   Temp 97.4 F (36.3 C) (Oral)   Resp 15   Ht 5' (1.524 m)   Wt 52.2 kg   SpO2 99%   BMI 22.46 kg/m    Final Clinical Impressions(s) / ED Diagnoses   Final diagnoses:  Syncope, unspecified syncope type    New Prescriptions New Prescriptions   No medications on file     Ripley Fraise, MD 12/28/15 [phone]

## 2015-12-28 NOTE — ED Triage Notes (Signed)
Pt arrived to er with caregiver for further evaluation of syncopal episode, caregiver and ems reports that pt on commode having a bowel movement when pt became upresponsive for approx. 30 seconds then started vomiting, on arrival to er pt alert, able to answer questions, denies any pain, states "I just dont feel good right now"

## 2015-12-31 ENCOUNTER — Telehealth: Payer: Self-pay | Admitting: Neurology

## 2015-12-31 NOTE — Telephone Encounter (Signed)
Pervis Hocking NP (pronounced like the number 2) @ [facility] [phone] called said the pt is very lethargic since increasing the seroquel to 75mg . She is inquiring if Dr Viona Gilmore will let her decrease it back to 50mg . Please call

## 2015-12-31 NOTE — Telephone Encounter (Signed)
I called Dr. Donell Sievert back, left a message. Okay to cut back on Seroquel to 50 mg at night. If hallucinations remain a problem, we can start Nuplazid and eventually stop the Seroquel altogether.

## 2016-01-01 ENCOUNTER — Observation Stay (HOSPITAL_COMMUNITY): Payer: Medicare Other

## 2016-01-01 ENCOUNTER — Encounter (HOSPITAL_COMMUNITY): Payer: Self-pay

## 2016-01-01 ENCOUNTER — Inpatient Hospital Stay (HOSPITAL_COMMUNITY)
Admission: EM | Admit: 2016-01-01 | Discharge: 2016-01-06 | DRG: 091 | Disposition: A | Discharge status: Home Health | Payer: Medicare Other | Attending: Internal Medicine | Admitting: Internal Medicine

## 2016-01-01 DIAGNOSIS — L89892 Pressure ulcer of other site, stage 2: Secondary | ICD-10-CM | POA: Diagnosis present

## 2016-01-01 DIAGNOSIS — T43595A Adverse effect of other antipsychotics and neuroleptics, initial encounter: Secondary | ICD-10-CM | POA: Diagnosis present

## 2016-01-01 DIAGNOSIS — Z823 Family history of stroke: Secondary | ICD-10-CM

## 2016-01-01 DIAGNOSIS — R4182 Altered mental status, unspecified: Secondary | ICD-10-CM | POA: Diagnosis not present

## 2016-01-01 DIAGNOSIS — F0280 Dementia in other diseases classified elsewhere without behavioral disturbance: Secondary | ICD-10-CM | POA: Diagnosis present

## 2016-01-01 DIAGNOSIS — N390 Urinary tract infection, site not specified: Secondary | ICD-10-CM

## 2016-01-01 DIAGNOSIS — E119 Type 2 diabetes mellitus without complications: Secondary | ICD-10-CM | POA: Diagnosis present

## 2016-01-01 DIAGNOSIS — Z85828 Personal history of other malignant neoplasm of skin: Secondary | ICD-10-CM

## 2016-01-01 DIAGNOSIS — E785 Hyperlipidemia, unspecified: Secondary | ICD-10-CM | POA: Diagnosis present

## 2016-01-01 DIAGNOSIS — T424X5A Adverse effect of benzodiazepines, initial encounter: Secondary | ICD-10-CM | POA: Diagnosis present

## 2016-01-01 DIAGNOSIS — Z8249 Family history of ischemic heart disease and other diseases of the circulatory system: Secondary | ICD-10-CM

## 2016-01-01 DIAGNOSIS — G92 Toxic encephalopathy: Secondary | ICD-10-CM | POA: Diagnosis not present

## 2016-01-01 DIAGNOSIS — T43215A Adverse effect of selective serotonin and norepinephrine reuptake inhibitors, initial encounter: Secondary | ICD-10-CM | POA: Diagnosis present

## 2016-01-01 DIAGNOSIS — G20 Parkinson's disease: Secondary | ICD-10-CM | POA: Diagnosis present

## 2016-01-01 DIAGNOSIS — Z0189 Encounter for other specified special examinations: Secondary | ICD-10-CM

## 2016-01-01 DIAGNOSIS — L8990 Pressure ulcer of unspecified site, unspecified stage: Secondary | ICD-10-CM | POA: Diagnosis present

## 2016-01-01 DIAGNOSIS — I10 Essential (primary) hypertension: Secondary | ICD-10-CM | POA: Diagnosis present

## 2016-01-01 DIAGNOSIS — G20C Parkinsonism, unspecified: Secondary | ICD-10-CM | POA: Diagnosis present

## 2016-01-01 DIAGNOSIS — G20A1 Parkinson's disease without dyskinesia, without mention of fluctuations: Secondary | ICD-10-CM | POA: Diagnosis present

## 2016-01-01 DIAGNOSIS — Z66 Do not resuscitate: Secondary | ICD-10-CM | POA: Diagnosis present

## 2016-01-01 DIAGNOSIS — I214 Non-ST elevation (NSTEMI) myocardial infarction: Secondary | ICD-10-CM | POA: Diagnosis present

## 2016-01-01 DIAGNOSIS — G9340 Encephalopathy, unspecified: Secondary | ICD-10-CM | POA: Diagnosis present

## 2016-01-01 DIAGNOSIS — Z8261 Family history of arthritis: Secondary | ICD-10-CM

## 2016-01-01 LAB — CBC WITH DIFFERENTIAL/PLATELET
Basophils Absolute: 0.1 10*3/uL (ref 0.0–0.1)
Basophils Relative: 1 %
Eosinophils Absolute: 0.3 10*3/uL (ref 0.0–0.7)
Eosinophils Relative: 4 %
HCT: 40.7 % (ref 36.0–46.0)
Hemoglobin: 13.7 g/dL (ref 12.0–15.0)
Lymphocytes Relative: 23 %
Lymphs Abs: 1.8 10*3/uL (ref 0.7–4.0)
MCH: 32.1 pg (ref 26.0–34.0)
MCHC: 33.7 g/dL (ref 30.0–36.0)
MCV: 95.3 fL (ref 78.0–100.0)
Monocytes Absolute: 0.7 10*3/uL (ref 0.1–1.0)
Monocytes Relative: 9 %
Neutro Abs: 4.8 10*3/uL (ref 1.7–7.7)
Neutrophils Relative %: 63 %
Platelets: 204 10*3/uL (ref 150–400)
RBC: 4.27 MIL/uL (ref 3.87–5.11)
RDW: 13.3 % (ref 11.5–15.5)
WBC: 7.6 10*3/uL (ref 4.0–10.5)

## 2016-01-01 LAB — BASIC METABOLIC PANEL
Anion gap: 4 — ABNORMAL LOW (ref 5–15)
BUN: 18 mg/dL (ref 6–20)
CO2: 28 mmol/L (ref 22–32)
Calcium: 9.2 mg/dL (ref 8.9–10.3)
Chloride: 106 mmol/L (ref 101–111)
Creatinine, Ser: 0.96 mg/dL (ref 0.44–1.00)
GFR calc Af Amer: >60 mL/min (ref >60)
GFR calc non Af Amer: 52 mL/min — ABNORMAL LOW (ref >60)
Glucose, Bld: 127 mg/dL — ABNORMAL HIGH (ref 65–99)
Potassium: 3.9 mmol/L (ref 3.5–5.1)
Sodium: 138 mmol/L (ref 135–145)

## 2016-01-01 LAB — URINALYSIS, ROUTINE W REFLEX MICROSCOPIC
Bilirubin Urine: NEGATIVE
Glucose, UA: NEGATIVE mg/dL
Hgb urine dipstick: NEGATIVE
Ketones, ur: NEGATIVE mg/dL
Leukocytes, UA: NEGATIVE
Nitrite: NEGATIVE
Protein, ur: NEGATIVE mg/dL
Specific Gravity, Urine: 1.015 (ref 1.005–1.030)
pH: 5.5 (ref 5.0–8.0)

## 2016-01-01 LAB — GLUCOSE, CAPILLARY: GLUCOSE-CAPILLARY: 109 mg/dL — AB (ref 65–99)

## 2016-01-01 LAB — TROPONIN I
TROPONIN I: 1.32 ng/mL — AB (ref <0.03)
Troponin I: 1.74 ng/mL (ref <0.03)

## 2016-01-01 LAB — TSH: TSH: 3.102 u[IU]/mL (ref 0.350–4.500)

## 2016-01-01 MED ORDER — ONDANSETRON HCL 4 MG PO TABS: 4.0000 mg | ORAL_TABLET | Freq: Four times a day (QID) | ORAL | Status: DC | PRN

## 2016-01-01 MED ORDER — ASPIRIN 81 MG PO CHEW
324.0000 mg | CHEWABLE_TABLET | Freq: Once | ORAL | Status: DC
  Filled 2016-01-01: qty 4

## 2016-01-01 MED ORDER — QUETIAPINE FUMARATE 25 MG PO TABS: 25.0000 mg | ORAL_TABLET | Freq: Every day | ORAL | Status: DC

## 2016-01-01 MED ORDER — WHITE PETROLATUM GEL
1.0000 "application " | Freq: Every day | Status: DC
  Filled 2016-01-01 (×9): qty 28.35

## 2016-01-01 MED ORDER — LORATADINE 10 MG PO TABS
10.0000 mg | ORAL_TABLET | Freq: Every day | ORAL | Status: DC
Start: 2016-01-02 — End: 2016-01-06
  Administered 2016-01-03 – 2016-01-06 (×4): 10 mg via ORAL
  Filled 2016-01-01 (×4): qty 1

## 2016-01-01 MED ORDER — SODIUM CHLORIDE 0.9 % IV SOLN
INTRAVENOUS | Status: DC
  Administered 2016-01-01 – 2016-01-03 (×3): via INTRAVENOUS

## 2016-01-01 MED ORDER — ACETAMINOPHEN 650 MG RE SUPP
650.0000 mg | Freq: Four times a day (QID) | RECTAL | Status: DC | PRN
Start: 2016-01-01 — End: 2016-01-06
  Filled 2016-01-01 (×2): qty 1

## 2016-01-01 MED ORDER — ONDANSETRON HCL 4 MG/2ML IJ SOLN: 4.0000 mg | Freq: Four times a day (QID) | INTRAMUSCULAR | Status: DC | PRN

## 2016-01-01 MED ORDER — ENOXAPARIN SODIUM 40 MG/0.4ML ~~LOC~~ SOLN
40.0000 mg | SUBCUTANEOUS | Status: DC
  Administered 2016-01-01 – 2016-01-02 (×2): 40 mg via SUBCUTANEOUS
  Filled 2016-01-01 (×2): qty 0.4

## 2016-01-01 MED ORDER — QUETIAPINE FUMARATE 25 MG PO TABS: 75.0000 mg | ORAL_TABLET | Freq: Every day | ORAL | Status: DC

## 2016-01-01 MED ORDER — POLYVINYL ALCOHOL 1.4 % OP SOLN: 1.0000 [drp] | Freq: Three times a day (TID) | OPHTHALMIC | Status: DC | PRN

## 2016-01-01 MED ORDER — CARBOXYMETHYLCELLULOSE SODIUM 1 % OP SOLN: 1.0000 [drp] | Freq: Three times a day (TID) | OPHTHALMIC | Status: DC

## 2016-01-01 MED ORDER — ACETAMINOPHEN 325 MG PO TABS
650.0000 mg | ORAL_TABLET | Freq: Four times a day (QID) | ORAL | Status: DC | PRN
Start: 2016-01-01 — End: 2016-01-06
  Administered 2016-01-03 (×2): 650 mg via ORAL
  Filled 2016-01-01 (×2): qty 2

## 2016-01-01 MED ORDER — CARBIDOPA-LEVODOPA 25-100 MG PO TABS
0.5000 | ORAL_TABLET | Freq: Three times a day (TID) | ORAL | Status: DC
  Administered 2016-01-01 – 2016-01-06 (×12): 0.5 via ORAL
  Filled 2016-01-01 (×12): qty 1

## 2016-01-01 MED ORDER — QUETIAPINE FUMARATE 25 MG PO TABS: 25.0000 mg | ORAL_TABLET | Freq: Two times a day (BID) | ORAL | Status: DC

## 2016-01-01 NOTE — ED Notes (Signed)
CRITICAL VALUE ALERT  Critical value received:  Troponin 1.74  Date of notification:  01/01/16  Time of notification:  1940  Critical value read back:Yes.    Nurse who received alert:  bkn  MD notified (1st page):  [place]  Time of first page:  1943  MD notified (2nd page):  Time of second page:  Responding MD:  Wilson Singer  Time MD responded:

## 2016-01-01 NOTE — ED Notes (Signed)
Patient with run of tachycardia on monitor. In to check on patient. Patient shaking and hot to touch. Rectal temp obtained. WNL. Patient sweating. Tachycardia improved when shaking is controlled.

## 2016-01-01 NOTE — ED Triage Notes (Signed)
Pt resident of [facility] senior [facility].  EMS reports that family says pt has had decreased LOC for the past week.  CBG with ems was 133, hr 96, bp was 124/74, 02 sat 98% on room air.

## 2016-01-01 NOTE — H&P (Signed)
TR[facility] H&P   Patient Demographics:    Catherine [name], is a 82 y.o. female  MRN: [id number]  DOB - 19-Sep-1927  Admit Date - 01/01/2016  Outpatient Primary MD for the patient is Renata Caprice, DO  Referring MD/NP/PA: Wilson Singer  Patient coming from: [facility]  Chief Complaint  Patient presents with  . Altered Mental Status      HPI:    Catherine [name]  is a 82 y.o. female, Who is a [facility] was properly the hospital for decreased level of consciousness for past few days. Patient was diagnosed with UTI on 12/22/2015 and started on ciprofloxacin, in the meantime her dose of Seroquel also was changed from 50 mg daily to 75 mg daily by her neurologist, since then patient has become more lethargic, less talkative, also had poor by mouth intake. There is no history of recent nausea or vomiting. No chest pain no shortness of breath no cough and phlegm.  In the ED lab showed troponin 1.74. EKG shows nonspecific T wave abnormalities in lateral leads.    Review of systems:     review of systems not obtainable due to patient's altered mental status     With Past History of the following :    Past Medical History:  Diagnosis Date  . Arthritis   . Cancer ([place])    basal cell-forehead  . 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 Diabetes mellitus without complication ([place])   . Dyslipidemia   . Hypertension   . Migraine without aura, without mention of intractable migraine without mention of status migrainosus   . Obese   . Paralysis agitans ([place]) 09/20/2012  . Parkinson's disease ([place])   . Sciatica of left side   . Tremor       Past Surgical History:  Procedure Laterality Date  . APPENDECTOMY    . Arthroscopic surgery     Left knee  . basal cell carcinoma resection     Forehead  . CATARACT EXTRACTION W/PHACO Right 11/19/2013   Procedure: CATARACT EXTRACTION  PHACO AND INTRAOCULAR LENS PLACEMENT (IOC);  Surgeon: Tonny Branch, MD;  Location: [facility];  Service: Ophthalmology;  Laterality: Right;  CDE:  12.95  . CATARACT EXTRACTION W/PHACO Left 12/17/2013   Procedure: CATARACT EXTRACTION PHACO AND INTRAOCULAR LENS PLACEMENT (IOC);  Surgeon: Tonny Branch, MD;  Location: [facility];  Service: Ophthalmology;  Laterality: Left;  CDE 12.10  . HIP PINNING,CANNULATED Left 05/23/2015   Procedure: INTERNAL FIXATION LEFT HIP;  Surgeon: Carole Civil, MD;  Location: [facility];  Service: Orthopedics;  Laterality: Left;  . MOUTH SURGERY    . TONSILLECTOMY    . trigger finger surgery     Bilateral thumb      Social History:     Social History  Substance Use Topics  . Smoking status: Never Smoker  . Smokeless tobacco: Never Used  . Alcohol use No        Family History :     Family  History  Problem Relation Age of Onset  . Rheum arthritis Mother   . Heart attack Father     not certain of COD  . Hypertension Paternal Grandfather   . Stroke Paternal Grandfather   . Cirrhosis Paternal Grandmother      Home Medications:   Prior to Admission medications   Medication Sig Start Date End Date Taking? Authorizing Provider  aspirin EC 81 MG tablet Take 81 mg by mouth daily.   Yes Historical Provider, MD  carbidopa-levodopa (SINEMET IR) 25-100 MG tablet Take 0.5 tablets by mouth 3 (three) times daily. 10/20/15  Yes Ward Givens, NP  carboxymethylcellulose 1 % ophthalmic solution 1 drop 3 (three) times daily.   Yes Historical Provider, MD  Control Gel Formula Dressing (DUODERM CGF DRESSING EX) Apply 1 application topically every 3 (three) days. Applied to right buttock wound every 3 days   Yes Historical Provider, MD  docusate sodium (COLACE) 100 MG capsule Take 1 capsule (100 mg total) by mouth 2 (two) times daily. 05/25/15  Yes Annita Brod, MD  fexofenadine (ALLEGRA) 180 MG tablet Take 180 mg by mouth daily.   Yes Historical Provider, MD  furosemide  (LASIX) 20 MG tablet Take 20 mg by mouth daily.    Yes Historical Provider, MD  LORazepam (ATIVAN) 0.5 MG tablet Take 0.5 mg by mouth 2 (two) times daily.   Yes Historical Provider, MD  meloxicam (MOBIC) 7.5 MG tablet Take 7.5 mg by mouth daily.   Yes Historical Provider, MD  mineral oil liquid Place into both ears every Monday. Instill 2 drops in both ears once daily every Monday for Cerumen Impaction.   Yes Historical Provider, MD  Multiple Vitamin (MULTIVITAMIN) capsule Take 1 capsule by mouth daily.   Yes Historical Provider, MD  naproxen sodium (ANAPROX) 220 MG tablet Take 220 mg by mouth 2 (two) times daily as needed (pain).   Yes Historical Provider, MD  ondansetron (ZOFRAN) 4 MG tablet Take 4 mg by mouth every 6 (six) hours as needed for nausea or vomiting.    Yes Historical Provider, MD  polyethylene glycol (MIRALAX / GLYCOLAX) packet Take 17 g by mouth daily as needed for mild constipation.   Yes Historical Provider, MD  potassium chloride (K-DUR) 10 MEQ tablet Take 10 mEq by mouth daily.   Yes Historical Provider, MD  QUEtiapine (SEROQUEL) 25 MG tablet Take 25-75 mg by mouth 2 (two) times daily. Take one tab daily and three tabs at bedtime   Yes Historical Provider, MD  ramipril (ALTACE) 2.5 MG capsule Take 2.5 mg by mouth daily.   Yes Historical Provider, MD  senna (SENOKOT) 8.6 MG TABS tablet Take 1 tablet by mouth at bedtime.   Yes Historical Provider, MD  traZODone (DESYREL) 50 MG tablet Take 50 mg by mouth at bedtime.   Yes Historical Provider, MD  white petrolatum (VASELINE) GEL Apply 1 application topically daily. Applied to bottom, peri area, and thighs topically for rash   Yes Historical Provider, MD  ciprofloxacin (CIPRO) 250 MG tablet Take 250 mg by mouth 2 (two) times daily.    Historical Provider, MD     Allergies:     Allergies  Allergen Reactions  . Sulfa Antibiotics Rash  . Azilect [Rasagiline] Rash  . Fenofibrate Rash  . Sinemet [Carbidopa-Levodopa] Rash  . Tape  Rash and Other (See Comments)    Redness  . Tomato Rash    Raw only     Physical Exam:   Vitals  Blood  pressure 92/67, pulse 111, temperature 98.4 F (36.9 C), temperature source Rectal, resp. rate 17, SpO2 94 %.   1. General Lethargic appearing female lying in bed in NAD, follows commands.  2. Normal affect and insight, drowsy but easily arousable to verbal stimulation  3. No F.N deficits, ALL C.Nerves Intact, Strength 5/5 all 4 extremities, Sensation intact all 4 extremities, Plantars down going.  4. Ears and Eyes appear Normal, Conjunctivae clear, PERRLA. Moist Oral Mucosa.  5. Supple Neck, No JVD, No cervical lymphadenopathy appriciated, No Carotid Bruits.  6. Symmetrical Chest wall movement, Good air movement bilaterally, CTAB.  7. RRR, No Gallops, Rubs or Murmurs, No Parasternal Heave.No Leg edema  8. Positive Bowel Sounds, Abdomen Soft, No tenderness, No organomegaly appriciated,No rebound -guarding or rigidity.  9.  No Cyanosis, Normal Skin Turgor, No Skin Rash or Bruise.  10. Good muscle tone,  joints appear normal , no effusions, Normal ROM.      Data Review:    CBC  Recent Labs Lab 12/28/15 0106 01/01/16 1800  WBC 6.2 7.6  HGB 12.5 13.7  HCT 36.8 40.7  PLT 165 204  MCV 95.3 95.3  MCH 32.4 32.1  MCHC 34.0 33.7  RDW 12.7 13.3  LYMPHSABS  --  1.8  MONOABS  --  0.7  EOSABS  --  0.3  BASOSABS  --  0.1   ------------------------------------------------------------------------------------------------------------------  Chemistries   Recent Labs Lab 12/28/15 0106 01/01/16 1800  NA 136 138  K 3.6 3.9  CL 107 106  CO2 24 28  GLUCOSE 172* 127*  BUN 20 18  CREATININE 1.25* 0.96  CALCIUM 8.4* 9.2   ------------------------------------------------------------------------------------------------------------------  ------------------------------------------------------------------------------------------------------------------   Recent  Labs Lab 12/28/15 0106 01/01/16 1800  TROPONINI <0.03 1.74*   BNP (last 3 results) No results for input(s): PROBNP in the last 8760 hours. HbA1C: No results for input(s): HGBA1C in the last 72 hours. CBG:  Recent Labs Lab 12/28/15 0119  GLUCAP 187*   Lipid Profile: No results for input(s): CHOL, HDL, LDLCALC, TRIG, CHOLHDL, LDLDIRECT in the last 72 hours. Thyroid Function Tests: No results for input(s): TSH, T4TOTAL, FREET4, T3FREE, THYROIDAB in the last 72 hours. Anemia Panel: No results for input(s): VITAMINB12, FOLATE, FERRITIN, TIBC, IRON, RETICCTPCT in the last 72 hours.  --------------------------------------------------------------------------------------------------------------- Urine analysis:    Component Value Date/Time   COLORURINE YELLOW 01/01/2016 1845   APPEARANCEUR CLEAR 01/01/2016 1845   LABSPEC 1.015 01/01/2016 1845   PHURINE 5.5 01/01/2016 1845   GLUCOSEU NEGATIVE 01/01/2016 1845   HGBUR NEGATIVE 01/01/2016 1845   BILIRUBINUR NEGATIVE 01/01/2016 1845   KETONESUR NEGATIVE 01/01/2016 1845   PROTEINUR NEGATIVE 01/01/2016 1845   UROBILINOGEN 0.2 12/21/2014 2315   NITRITE NEGATIVE 01/01/2016 1845   LEUKOCYTESUR NEGATIVE 01/01/2016 1845      ----------------------------------------------------------------------------------------------------------------   Imaging Results:    No results found.  My personal review of EKG: Rhythm NSR, Nonspecific T-wave changes in lateral leads   Assessment & Plan:    Active Problems:   Parkinson's disease ([place])   Altered mental status   1. Altered mental status- likely  result of polypharmacy, will hold Seroquel tonight and start from reduced dose 25 mg twice a day from tomorrow morning. Hold Ativan and trazodone. If patient does not improve in next 12 hours, consider imaging study including CT head/MRI brain/neurology consultation. Will check chest x-ray 2. Parkinson disease- patient is verbal at baseline,  ambulates with assistance. We'll continue with Sinemet. 3. Recent UTI- patient completed the antibiotic ciprofloxacin treatment as  outpatient. Her urine analysis showed no  significant abnormalities. 4. Elevated troponin- patient found to have elevation of troponin 1.74, she did have sinus tachycardia in the hospital. No history of chest pain or CAD .This could be from demand ischemia, we will cycle troponins in the hospital. If troponin remains elevated consider cardiology consultation a.m. 5.    DVT Prophylaxis-   Lovenox   AM Labs Ordered, also please review Full Orders  Family Communication: Admission, patients condition and plan of care including tests being ordered have been discussed with the patient and her caregivers and wife of HC POA at bedside* who indicate understanding and agree with the plan and Code Status.  Code Status: Full code  Admission status: Observation    Time spent in minutes : 60 minutes   Domonique Brouillard S M.D on 01/01/2016 at 8:54 PM  Between 7am to 7pm - Pager - [phone]. After 7pm go to [email or web address] - password [facility]  [facility] - Office  [phone]

## 2016-01-02 ENCOUNTER — Inpatient Hospital Stay (HOSPITAL_COMMUNITY): Payer: Medicare Other

## 2016-01-02 DIAGNOSIS — R4182 Altered mental status, unspecified: Secondary | ICD-10-CM | POA: Diagnosis present

## 2016-01-02 DIAGNOSIS — T424X5A Adverse effect of benzodiazepines, initial encounter: Secondary | ICD-10-CM | POA: Diagnosis present

## 2016-01-02 DIAGNOSIS — I10 Essential (primary) hypertension: Secondary | ICD-10-CM

## 2016-01-02 DIAGNOSIS — L8990 Pressure ulcer of unspecified site, unspecified stage: Secondary | ICD-10-CM | POA: Diagnosis present

## 2016-01-02 DIAGNOSIS — Z8261 Family history of arthritis: Secondary | ICD-10-CM | POA: Diagnosis not present

## 2016-01-02 DIAGNOSIS — Z66 Do not resuscitate: Secondary | ICD-10-CM | POA: Diagnosis present

## 2016-01-02 DIAGNOSIS — F0280 Dementia in other diseases classified elsewhere without behavioral disturbance: Secondary | ICD-10-CM | POA: Diagnosis present

## 2016-01-02 DIAGNOSIS — T43215A Adverse effect of selective serotonin and norepinephrine reuptake inhibitors, initial encounter: Secondary | ICD-10-CM | POA: Diagnosis present

## 2016-01-02 DIAGNOSIS — G20 Parkinson's disease: Secondary | ICD-10-CM

## 2016-01-02 DIAGNOSIS — Z823 Family history of stroke: Secondary | ICD-10-CM | POA: Diagnosis not present

## 2016-01-02 DIAGNOSIS — Z85828 Personal history of other malignant neoplasm of skin: Secondary | ICD-10-CM | POA: Diagnosis not present

## 2016-01-02 DIAGNOSIS — T43595A Adverse effect of other antipsychotics and neuroleptics, initial encounter: Secondary | ICD-10-CM | POA: Diagnosis present

## 2016-01-02 DIAGNOSIS — N390 Urinary tract infection, site not specified: Secondary | ICD-10-CM | POA: Diagnosis not present

## 2016-01-02 DIAGNOSIS — G9340 Encephalopathy, unspecified: Secondary | ICD-10-CM | POA: Diagnosis present

## 2016-01-02 DIAGNOSIS — L89892 Pressure ulcer of other site, stage 2: Secondary | ICD-10-CM | POA: Diagnosis present

## 2016-01-02 DIAGNOSIS — E785 Hyperlipidemia, unspecified: Secondary | ICD-10-CM | POA: Diagnosis present

## 2016-01-02 DIAGNOSIS — G92 Toxic encephalopathy: Secondary | ICD-10-CM | POA: Diagnosis present

## 2016-01-02 DIAGNOSIS — I214 Non-ST elevation (NSTEMI) myocardial infarction: Secondary | ICD-10-CM | POA: Diagnosis present

## 2016-01-02 DIAGNOSIS — E119 Type 2 diabetes mellitus without complications: Secondary | ICD-10-CM | POA: Diagnosis present

## 2016-01-02 DIAGNOSIS — Z8249 Family history of ischemic heart disease and other diseases of the circulatory system: Secondary | ICD-10-CM | POA: Diagnosis not present

## 2016-01-02 LAB — BLOOD GAS, ARTERIAL
ACID-BASE DEFICIT: 2 mmol/L (ref 0.0–2.0)
BICARBONATE: 23.1 meq/L (ref 20.0–24.0)
Drawn by: 221791
FIO2: 21
O2 SAT: 95.2 %
PATIENT TEMPERATURE: 37
PO2 ART: 80.8 mmHg (ref 80.0–100.0)
TCO2: 17.4 mmol/L (ref 0–100)
pCO2 arterial: 33 mmHg — ABNORMAL LOW (ref 35.0–45.0)
pH, Arterial: 7.432 (ref 7.350–7.450)

## 2016-01-02 LAB — GLUCOSE, CAPILLARY
GLUCOSE-CAPILLARY: 140 mg/dL — AB (ref 65–99)
GLUCOSE-CAPILLARY: 128 mg/dL — AB (ref 65–99)
Glucose-Capillary: 108 mg/dL — ABNORMAL HIGH (ref 65–99)
Glucose-Capillary: 126 mg/dL — ABNORMAL HIGH (ref 65–99)
Glucose-Capillary: 116 mg/dL — ABNORMAL HIGH (ref 65–99)

## 2016-01-02 LAB — CBC
HEMATOCRIT: 39.1 % (ref 36.0–46.0)
HEMOGLOBIN: 13.2 g/dL (ref 12.0–15.0)
MCH: 32 pg (ref 26.0–34.0)
MCHC: 33.8 g/dL (ref 30.0–36.0)
MCV: 94.7 fL (ref 78.0–100.0)
Platelets: 220 10*3/uL (ref 150–400)
RBC: 4.13 MIL/uL (ref 3.87–5.11)
RDW: 13.1 % (ref 11.5–15.5)
WBC: 11.7 10*3/uL — ABNORMAL HIGH (ref 4.0–10.5)

## 2016-01-02 LAB — COMPREHENSIVE METABOLIC PANEL
ALBUMIN: 3.9 g/dL (ref 3.5–5.0)
ALK PHOS: 49 U/L (ref 38–126)
ALT: 6 U/L — ABNORMAL LOW (ref 14–54)
ANION GAP: 13 (ref 5–15)
AST: 30 U/L (ref 15–41)
BILIRUBIN TOTAL: 0.9 mg/dL (ref 0.3–1.2)
BUN: 21 mg/dL — ABNORMAL HIGH (ref 6–20)
CALCIUM: 9.6 mg/dL (ref 8.9–10.3)
CO2: 23 mmol/L (ref 22–32)
Chloride: 105 mmol/L (ref 101–111)
Creatinine, Ser: 1.16 mg/dL — ABNORMAL HIGH (ref 0.44–1.00)
GFR calc Af Amer: 48 mL/min — ABNORMAL LOW (ref >60)
GFR, EST NON AFRICAN AMERICAN: 42 mL/min — AB (ref >60)
GLUCOSE: 128 mg/dL — AB (ref 65–99)
POTASSIUM: 4.4 mmol/L (ref 3.5–5.1)
Sodium: 141 mmol/L (ref 135–145)
TOTAL PROTEIN: 6.7 g/dL (ref 6.5–8.1)

## 2016-01-02 LAB — TROPONIN I
TROPONIN I: 1.24 ng/mL — AB (ref <0.03)
Troponin I: 1.32 ng/mL (ref <0.03)

## 2016-01-02 LAB — AMMONIA: Ammonia: 22 umol/L (ref 9–35)

## 2016-01-02 LAB — MRSA PCR SCREENING: MRSA by PCR: POSITIVE — AB

## 2016-01-02 MED ORDER — AMANTADINE HCL 100 MG PO CAPS
100.0000 mg | ORAL_CAPSULE | Freq: Two times a day (BID) | ORAL | Status: DC
  Administered 2016-01-02 – 2016-01-06 (×8): 100 mg via ORAL
  Filled 2016-01-02 (×10): qty 1

## 2016-01-02 MED ORDER — AMANTADINE HCL 100 MG PO CAPS
ORAL_CAPSULE | ORAL | Status: AC
  Filled 2016-01-02: qty 1

## 2016-01-02 MED ORDER — CHLORHEXIDINE GLUCONATE CLOTH 2 % EX PADS
6.0000 | MEDICATED_PAD | Freq: Every day | CUTANEOUS | Status: DC
  Administered 2016-01-02 – 2016-01-06 (×4): 6 via TOPICAL

## 2016-01-02 MED ORDER — WHITE PETROLATUM GEL
Freq: Every day | Status: DC
  Administered 2016-01-03: 10:00:00 via TOPICAL
  Filled 2016-01-02 (×8): qty 28.35

## 2016-01-02 MED ORDER — MUPIROCIN 2 % EX OINT
1.0000 "application " | TOPICAL_OINTMENT | Freq: Two times a day (BID) | CUTANEOUS | Status: DC
  Administered 2016-01-02 – 2016-01-06 (×9): 1 via NASAL
  Filled 2016-01-02 (×2): qty 22

## 2016-01-02 MED ORDER — ASPIRIN 300 MG RE SUPP
300.0000 mg | Freq: Every day | RECTAL | Status: DC
  Administered 2016-01-02: 300 mg via RECTAL
  Filled 2016-01-02: qty 1

## 2016-01-02 NOTE — Progress Notes (Signed)
CRITICAL VALUE ALERT  Critical value received: MRSA Positive  Date of notification:  01/02/2016  Time of notification:  0030  Critical value read back: yes  Nurse who received alert:  Stephannie Peters  MD notified (1st page):  Followed Protocol  Time of first page:  0030  MD notified (2nd page):  Time of second page:  Responding MD:  Followed Protocol  Time MD responded:  [id number]

## 2016-01-02 NOTE — Consult Note (Signed)
[place] A. Merlene Laughter, MD     [email or web address]          Catherine [name] is an 82 y.o. female.   ASSESSMENT/PLAN: 1. Acute encephalopathy most likely due to multifactorial etiologies particular medication and parkinsonism. Noninfectious etiology uncovered. Imaging has not been done but I doubt that there is significant intracranial explanation.  2. 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   RECOMMENDATION: We will place the patient on a temporary NG tube that medications can be administered. We saw the patient on all her medicines except we will discontinue the Seroquel for now. This can be reintroduced a lower dose. We'll go ahead and start the patient on Sinemet but also restart the amantadine. Follow-up CT scan.  Patient 82 year old white female with complicated medical history. She has had Parkinson disease for a few years. Things was diagnosed just a few years ago probably 5 years or less but her course has been rather complicated. She has not been able to tolerate most parkinsonian medications because of their side effects. She's had significant hallucinations early during her course although I think passed a 1 year marked. The patient has had hallucinations with medications in particular. It appears that the Sinemet seems to consistently cause the patient to have significant hallucinations. Other medication includes Azilect, a dopamine agonist and amantadine. The patient has been seen by me and also by Dr. Jannifer Franklin in [place]. More recently swim back to [facility] to see Dr. Jannifer Franklin. She also has been in behavioral hospital at [facility] because of hallucinations. The hallucinations and optically scary but they occur quite frequently especially with Sinemet. Which he saw Dr. Jannifer Franklin after she left [place], Dr. was discontinued her Risperdal and increase the dose of Seroquel for 50 mg and 5 mg. Amantadine which she was on 100 mg 3 times a day was discontinued as was  trazodone. It appears that patient became quite stuporous shortly after this and hence she was admitted. She was diagnosed as having a UTI about 10 days ago and was treated with Cipro. The patient is unresponsive and a history Obtained.      GENERAL: Unresponsive.  HEENT: Severe axial rigidity.  ABDOMEN: soft  EXTREMITIES: No edema   BACK: Normal   SKIN: Normal by inspection.    MENTAL STATUS: No eye opening even to painful stem light. No verbal output.  CRANIAL NERVES: Pupils are equal, round and reactive to light; extra ocular movements are full - passively, there is no significant nystagmus; visual fields are full; upper and lower facial muscles are symmetric, there is no flattening of the nasolabial folds.  MOTOR: Minimal movement to pain most due to severe parkinsonism.  COORDINATION: Continuous pin rolling tremor right upper extremity. Severe global rigidity and cogwheeling. Some tremor of the left side.  REFLEXES: Deep tendon reflexes are symmetrical and normal.   SENSATION: Unresponsive to deep painful stimuli.      Blood pressure 117/62, pulse (!) 146, temperature 98.2 F (36.8 C), temperature source Axillary, resp. rate 20, weight 112 lb 7 oz (51 kg), SpO2 95 %.  Past Medical History:  Diagnosis Date  . Arthritis   . Cancer ([place])    basal cell-forehead  . Dementia in Parkinson's disease ([place]) 12/25/2015  . Diabetes mellitus without complication ([place])   . Dyslipidemia   . Hypertension   . Migraine without aura, without mention of intractable migraine without mention of status migrainosus   . Obese   . Paralysis agitans ([place]) 09/20/2012  . Parkinson's disease ([place])   .  Sciatica of left side   . Tremor     Past Surgical History:  Procedure Laterality Date  . APPENDECTOMY    . Arthroscopic surgery     Left knee  . basal cell carcinoma resection     Forehead  . CATARACT EXTRACTION W/PHACO Right 11/19/2013   Procedure: CATARACT EXTRACTION PHACO AND  INTRAOCULAR LENS PLACEMENT (IOC);  Surgeon: Tonny Branch, MD;  Location: [facility];  Service: Ophthalmology;  Laterality: Right;  CDE:  12.95  . CATARACT EXTRACTION W/PHACO Left 12/17/2013   Procedure: CATARACT EXTRACTION PHACO AND INTRAOCULAR LENS PLACEMENT (IOC);  Surgeon: Tonny Branch, MD;  Location: [facility];  Service: Ophthalmology;  Laterality: Left;  CDE 12.10  . HIP PINNING,CANNULATED Left 05/23/2015   Procedure: INTERNAL FIXATION LEFT HIP;  Surgeon: Carole Civil, MD;  Location: [facility];  Service: Orthopedics;  Laterality: Left;  . MOUTH SURGERY    . TONSILLECTOMY    . trigger finger surgery     Bilateral thumb    Family History  Problem Relation Age of Onset  . Rheum arthritis Mother   . Heart attack Father     not certain of COD  . Hypertension Paternal Grandfather   . Stroke Paternal Grandfather   . Cirrhosis Paternal Grandmother     Social History:  reports that she has never smoked. She has never used smokeless tobacco. She reports that she does not drink alcohol or use drugs.  Allergies:  Allergies  Allergen Reactions  . Sulfa Antibiotics Rash  . Azilect [Rasagiline] Rash  . Fenofibrate Rash  . Sinemet [Carbidopa-Levodopa] Rash  . Tape Rash and Other (See Comments)    Redness  . Tomato Rash    Pt can eat raw tomatoes.Marland Kitchenate tomato sandwich a few days ago    Medications: Prior to Admission medications   Medication Sig Start Date End Date Taking? Authorizing Provider  aspirin EC 81 MG tablet Take 81 mg by mouth daily.   Yes Historical Provider, MD  carbidopa-levodopa (SINEMET IR) 25-100 MG tablet Take 0.5 tablets by mouth 3 (three) times daily. 10/20/15  Yes Ward Givens, NP  carboxymethylcellulose 1 % ophthalmic solution 1 drop 3 (three) times daily.   Yes Historical Provider, MD  Control Gel Formula Dressing (DUODERM CGF DRESSING EX) Apply 1 application topically every 3 (three) days. Applied to right buttock wound every 3 days   Yes Historical Provider, MD  docusate  sodium (COLACE) 100 MG capsule Take 1 capsule (100 mg total) by mouth 2 (two) times daily. 05/25/15  Yes Annita Brod, MD  fexofenadine (ALLEGRA) 180 MG tablet Take 180 mg by mouth daily.   Yes Historical Provider, MD  furosemide (LASIX) 20 MG tablet Take 20 mg by mouth daily.    Yes Historical Provider, MD  LORazepam (ATIVAN) 0.5 MG tablet Take 0.5 mg by mouth 2 (two) times daily.   Yes Historical Provider, MD  meloxicam (MOBIC) 7.5 MG tablet Take 7.5 mg by mouth daily.   Yes Historical Provider, MD  mineral oil liquid Place into both ears every Monday. Instill 2 drops in both ears once daily every Monday for Cerumen Impaction.   Yes Historical Provider, MD  Multiple Vitamin (MULTIVITAMIN) capsule Take 1 capsule by mouth daily.   Yes Historical Provider, MD  naproxen sodium (ANAPROX) 220 MG tablet Take 220 mg by mouth 2 (two) times daily as needed (pain).   Yes Historical Provider, MD  ondansetron (ZOFRAN) 4 MG tablet Take 4 mg by mouth every 6 (six) hours  as needed for nausea or vomiting.    Yes Historical Provider, MD  polyethylene glycol (MIRALAX / GLYCOLAX) packet Take 17 g by mouth daily as needed for mild constipation.   Yes Historical Provider, MD  potassium chloride (K-DUR) 10 MEQ tablet Take 10 mEq by mouth daily.   Yes Historical Provider, MD  QUEtiapine (SEROQUEL) 25 MG tablet Take 25-75 mg by mouth 2 (two) times daily. Take one tab daily and three tabs at bedtime   Yes Historical Provider, MD  ramipril (ALTACE) 2.5 MG capsule Take 2.5 mg by mouth daily.   Yes Historical Provider, MD  senna (SENOKOT) 8.6 MG TABS tablet Take 1 tablet by mouth at bedtime.   Yes Historical Provider, MD  traZODone (DESYREL) 50 MG tablet Take 50 mg by mouth at bedtime.   Yes Historical Provider, MD  white petrolatum (VASELINE) GEL Apply 1 application topically daily. Applied to bottom, peri area, and thighs topically for rash   Yes Historical Provider, MD  ciprofloxacin (CIPRO) 250 MG tablet Take 250 mg by  mouth 2 (two) times daily.    Historical Provider, MD    Scheduled Meds: . aspirin  300 mg Rectal Daily  . carbidopa-levodopa  0.5 tablet Oral TID  . Chlorhexidine Gluconate Cloth  6 each Topical Q0600  . enoxaparin (LOVENOX) injection  40 mg Subcutaneous Q24H  . loratadine  10 mg Oral Daily  . mupirocin ointment  1 application Nasal BID  . [START ON 01/03/2016] white petrolatum   Topical Daily   Continuous Infusions: . sodium chloride 75 mL/hr at 01/02/16 1625   PRN Meds:.acetaminophen **OR** acetaminophen, ondansetron **OR** ondansetron (ZOFRAN) IV, polyvinyl alcohol     Results for orders placed or performed during the hospital encounter of 01/01/16 (from the past 48 hour(s))  Basic metabolic panel     Status: Abnormal   Collection Time: 01/01/16  6:00 PM  Result Value Ref Range   Sodium 138 135 - 145 mmol/L   Potassium 3.9 3.5 - 5.1 mmol/L   Chloride 106 101 - 111 mmol/L   CO2 28 22 - 32 mmol/L   Glucose, Bld 127 (H) 65 - 99 mg/dL   BUN 18 6 - 20 mg/dL   Creatinine, Ser 0.96 0.44 - 1.00 mg/dL   Calcium 9.2 8.9 - 10.3 mg/dL   GFR calc non Af Amer 52 (L) >60 mL/min   GFR calc Af Amer >60 >60 mL/min    Comment: (NOTE) The eGFR has been calculated using the CKD EPI equation. This calculation has not been validated in all clinical situations. eGFR's persistently <60 mL/min signify possible Chronic Kidney Disease.    Anion gap 4 (L) 5 - 15  CBC with Differential     Status: None   Collection Time: 01/01/16  6:00 PM  Result Value Ref Range   WBC 7.6 4.0 - 10.5 K/uL   RBC 4.27 3.87 - 5.11 MIL/uL   Hemoglobin 13.7 12.0 - 15.0 g/dL   HCT 40.7 36.0 - 46.0 %   MCV 95.3 78.0 - 100.0 fL   MCH 32.1 26.0 - 34.0 pg   MCHC 33.7 30.0 - 36.0 g/dL   RDW 13.3 11.5 - 15.5 %   Platelets 204 150 - 400 K/uL   Neutrophils Relative % 63 %   Neutro Abs 4.8 1.7 - 7.7 K/uL   Lymphocytes Relative 23 %   Lymphs Abs 1.8 0.7 - 4.0 K/uL   Monocytes Relative 9 %   Monocytes Absolute 0.7 0.1 -  1.0  K/uL   Eosinophils Relative 4 %   Eosinophils Absolute 0.3 0.0 - 0.7 K/uL   Basophils Relative 1 %   Basophils Absolute 0.1 0.0 - 0.1 K/uL  Troponin I     Status: Abnormal   Collection Time: 01/01/16  6:00 PM  Result Value Ref Range   Troponin I 1.74 (HH) <0.03 ng/mL    Comment: CRITICAL RESULT CALLED TO, READ BACK BY AND VERIFIED WITH: NORMAN,B AT 1940 ON 01/01/2016 BY ISLEY,B   TSH     Status: None   Collection Time: 01/01/16  6:00 PM  Result Value Ref Range   TSH 3.102 0.350 - 4.500 uIU/mL  Urinalysis, Routine w reflex microscopic (not at [facility])     Status: None   Collection Time: 01/01/16  6:45 PM  Result Value Ref Range   Color, Urine YELLOW YELLOW   APPearance CLEAR CLEAR   Specific Gravity, Urine 1.015 1.005 - 1.030   pH 5.5 5.0 - 8.0   Glucose, UA NEGATIVE NEGATIVE mg/dL   Hgb urine dipstick NEGATIVE NEGATIVE   Bilirubin Urine NEGATIVE NEGATIVE   Ketones, ur NEGATIVE NEGATIVE mg/dL   Protein, ur NEGATIVE NEGATIVE mg/dL   Nitrite NEGATIVE NEGATIVE   Leukocytes, UA NEGATIVE NEGATIVE    Comment: MICROSCOPIC NOT DONE ON URINES WITH NEGATIVE PROTEIN, BLOOD, LEUKOCYTES, NITRITE, OR GLUCOSE <1000 mg/dL.  Glucose, capillary     Status: Abnormal   Collection Time: 01/01/16  9:28 PM  Result Value Ref Range   Glucose-Capillary 109 (H) 65 - 99 mg/dL   Comment 1 Notify RN    Comment 2 Document in Chart   Troponin I     Status: Abnormal   Collection Time: 01/01/16  9:43 PM  Result Value Ref Range   Troponin I 1.32 (HH) <0.03 ng/mL    Comment: CRITICAL RESULT CALLED TO, READ BACK BY AND VERIFIED WITH: HAMLTION,S AT 2240 ON 01/01/2016 BY ISLEY,B   MRSA PCR Screening     Status: Abnormal   Collection Time: 01/01/16  9:45 PM  Result Value Ref Range   MRSA by PCR POSITIVE (A) NEGATIVE    Comment:        The GeneXpert MRSA Assay (FDA approved for NASAL specimens only), is one component of a comprehensive MRSA colonization surveillance program. It is not intended to diagnose  MRSA infection nor to guide or monitor treatment for MRSA infections. RESULT CALLED TO, READ BACK BY AND VERIFIED WITH: HAMILTON S AT 0027 ON [id number] BY FORSYTH K   Glucose, capillary     Status: Abnormal   Collection Time: 01/02/16  1:24 AM  Result Value Ref Range   Glucose-Capillary 140 (H) 65 - 99 mg/dL   Comment 1 Notify RN    Comment 2 Document in Chart   Troponin I     Status: Abnormal   Collection Time: 01/02/16  3:36 AM  Result Value Ref Range   Troponin I 1.32 (HH) <0.03 ng/mL    Comment: CRITICAL VALUE NOTED.  VALUE IS CONSISTENT WITH PREVIOUSLY REPORTED AND CALLED VALUE.  CBC     Status: Abnormal   Collection Time: 01/02/16  3:36 AM  Result Value Ref Range   WBC 11.7 (H) 4.0 - 10.5 K/uL   RBC 4.13 3.87 - 5.11 MIL/uL   Hemoglobin 13.2 12.0 - 15.0 g/dL   HCT 39.1 36.0 - 46.0 %   MCV 94.7 78.0 - 100.0 fL   MCH 32.0 26.0 - 34.0 pg   MCHC 33.8 30.0 - 36.0 g/dL  RDW 13.1 11.5 - 15.5 %   Platelets 220 150 - 400 K/uL  Comprehensive metabolic panel     Status: Abnormal   Collection Time: 01/02/16  3:36 AM  Result Value Ref Range   Sodium 141 135 - 145 mmol/L   Potassium 4.4 3.5 - 5.1 mmol/L   Chloride 105 101 - 111 mmol/L   CO2 23 22 - 32 mmol/L   Glucose, Bld 128 (H) 65 - 99 mg/dL   BUN 21 (H) 6 - 20 mg/dL   Creatinine, Ser 1.16 (H) 0.44 - 1.00 mg/dL   Calcium 9.6 8.9 - 10.3 mg/dL   Total Protein 6.7 6.5 - 8.1 g/dL   Albumin 3.9 3.5 - 5.0 g/dL   AST 30 15 - 41 U/L   ALT 6 (L) 14 - 54 U/L   Alkaline Phosphatase 49 38 - 126 U/L   Total Bilirubin 0.9 0.3 - 1.2 mg/dL   GFR calc non Af Amer 42 (L) >60 mL/min   GFR calc Af Amer 48 (L) >60 mL/min    Comment: (NOTE) The eGFR has been calculated using the CKD EPI equation. This calculation has not been validated in all clinical situations. eGFR's persistently <60 mL/min signify possible Chronic Kidney Disease.    Anion gap 13 5 - 15  Glucose, capillary     Status: Abnormal   Collection Time: 01/02/16  8:03 AM    Result Value Ref Range   Glucose-Capillary 108 (H) 65 - 99 mg/dL  Troponin I     Status: Abnormal   Collection Time: 01/02/16  9:52 AM  Result Value Ref Range   Troponin I 1.24 (HH) <0.03 ng/mL    Comment: CRITICAL VALUE NOTED.  VALUE IS CONSISTENT WITH PREVIOUSLY REPORTED AND CALLED VALUE.  Glucose, capillary     Status: Abnormal   Collection Time: 01/02/16 12:17 PM  Result Value Ref Range   Glucose-Capillary 128 (H) 65 - 99 mg/dL  Glucose, capillary     Status: Abnormal   Collection Time: 01/02/16  4:11 PM  Result Value Ref Range   Glucose-Capillary 126 (H) 65 - 99 mg/dL   Comment 1 Notify RN    Comment 2 Document in Chart   Ammonia     Status: None   Collection Time: 01/02/16  6:09 PM  Result Value Ref Range   Ammonia 22 9 - 35 umol/L  Blood gas, arterial     Status: Abnormal   Collection Time: 01/02/16  6:45 PM  Result Value Ref Range   FIO2 21.00    pH, Arterial 7.432 7.350 - 7.450   pCO2 arterial 33.0 (L) 35.0 - 45.0 mmHg   pO2, Arterial 80.8 80.0 - 100.0 mmHg   Bicarbonate 23.1 20.0 - 24.0 mEq/L   TCO2 17.4 0 - 100 mmol/L   Acid-base deficit 2.0 0.0 - 2.0 mmol/L   O2 Saturation 95.2 %   Patient temperature 37.0    Collection site RIGHT RADIAL    Drawn by [id number]    Sample type ARTERIAL    Allens test (pass/fail) PASS PASS    Studies/Results:     Johniece Hornbaker A. Merlene Laughter, M.D.  Diplomate, Tax adviser of Psychiatry and Neurology ( Neurology). 01/02/2016, 7:36 PM

## 2016-01-02 NOTE — Progress Notes (Signed)
PROGRESS NOTE    Catherine [name]  [id number] DOB: [birth year] DOA: 01/01/2016 PCP: Renata Caprice, DO    Brief Narrative:  10 yof with a past hx of dementia, HTN, and obesity presented with complaints of AMS. Pt is a resident as Engineer, materials living. Pt was dx with a UTI 12/22/15 and has completed a course of antibiotics. While in the ED she was noted to have an elevated troponin of 1.74. CXR revealed mild left basilar atelectasis. EKG showed T wave abnormalities in lateral leads. She was admitted for further evaluation of altered mental status.    Assessment & Plan:   Active Problems:   Parkinson's disease ([place])   Parkinsonian tremor ([facility])   Altered mental status   Acute encephalopathy   NSTEMI (non-ST elevated myocardial infarction) ([place])   HTN (hypertension)  1. Acute encephalopathy. Possibly related to polypharmacy. Seroquel dose was recently increaed and he is also on ativan and trazodone. Continue to hold sedating medications. Urinalysis does not show signs of infection and chest xray is unremarkable. Will check CT head. Will consult neurology.  2. Parkinson Disease. Continue sinemet. She has a resting tremor in right hand 3. NSTEMI. Troponin on admission was 1/7, trending down to 1.3. Sitter reports that patient has been complaining of intermittent chest pain over the last few days. She does not appear to have had any cardiac work up. Will check echo. Start the patient on aspirin. Consult cardiology.  4. HTN. Stable.  5. Recent UTI. Pt has completed a course of antibiotics. UA showed no abnormalities.    DVT prophylaxis: Lovenox Code Status: DNR Family Communication: Discussed with [place] over the phone Disposition Plan: Discharge back to [facility] once improved    Consultants:   Cardiology   Procedures:   None   Antimicrobials:   None    Subjective: Patient answers yes to her name, but does not follow commands or answers  further  Objective: Vitals:   01/01/16 2000 01/01/16 2030 01/02/16 0122 01/02/16 0601  BP: 92/67 (!) 119/54 136/67 94/65  Pulse: 111 74  (!) 116  Resp: 17 14  15   Temp:   98.3 F (36.8 C) 98.9 F (37.2 C)  TempSrc:    Oral  SpO2: 94% 96% 94% 94%  Weight:   51 kg (112 lb 7 oz)     Intake/Output Summary (Last 24 hours) at 01/02/16 1042 Last data filed at 01/02/16 0400  Gross per 24 hour  Intake              355 ml  Output               35 ml  Net              320 ml   Filed Weights   01/02/16 0122  Weight: 51 kg (112 lb 7 oz)    Examination:  General exam: Appears calm and comfortable  Respiratory system: Clear to auscultation. Respiratory effort normal. Cardiovascular system: S1 & S2 heard, RRR. No JVD, murmurs, rubs, gallops or clicks. No pedal edema. Gastrointestinal system: Abdomen is nondistended, soft and nontender. No organomegaly or masses felt. Normal bowel sounds heard. Central nervous system: lethargic. Tremor in right hand Skin: No rashes, lesions or ulcers Psychiatry: lethargic     Data Reviewed: I have personally reviewed following labs and imaging studies  CBC:  Recent Labs Lab 12/28/15 0106 01/01/16 1800 01/02/16 0336  WBC 6.2 7.6 11.7*  NEUTROABS  --  4.8  --  HGB 12.5 13.7 13.2  HCT 36.8 40.7 39.1  MCV 95.3 95.3 94.7  PLT 165 204 [id number]   Basic Metabolic Panel:  Recent Labs Lab 12/28/15 0106 01/01/16 1800 01/02/16 0336  NA 136 138 141  K 3.6 3.9 4.4  CL 107 106 105  CO2 24 28 23   GLUCOSE 172* 127* 128*  BUN 20 18 21*  CREATININE 1.25* 0.96 1.16*  CALCIUM 8.4* 9.2 9.6   GFR: Estimated Creatinine Clearance: 25.5 mL/min (by C-G formula based on SCr of 1.16 mg/dL). Liver Function Tests:  Recent Labs Lab 01/02/16 0336  AST 30  ALT 6*  ALKPHOS 49  BILITOT 0.9  PROT 6.7  ALBUMIN 3.9   No results for input(s): LIPASE, AMYLASE in the last 168 hours. No results for input(s): AMMONIA in the last 168 hours. Coagulation  Profile: No results for input(s): INR, PROTIME in the last 168 hours. Cardiac Enzymes:  Recent Labs Lab 12/28/15 0106 01/01/16 1800 01/01/16 2143 01/02/16 0336  TROPONINI <0.03 1.74* 1.32* 1.32*   BNP (last 3 results) No results for input(s): PROBNP in the last 8760 hours. HbA1C: No results for input(s): HGBA1C in the last 72 hours. CBG:  Recent Labs Lab 12/28/15 0119 01/01/16 2128 01/02/16 0124 01/02/16 0803  GLUCAP 187* 109* 140* 108*   Lipid Profile: No results for input(s): CHOL, HDL, LDLCALC, TRIG, CHOLHDL, LDLDIRECT in the last 72 hours. Thyroid Function Tests:  Recent Labs  01/01/16 1800  TSH 3.102   Anemia Panel: No results for input(s): VITAMINB12, FOLATE, FERRITIN, TIBC, IRON, RETICCTPCT in the last 72 hours. Sepsis Labs: No results for input(s): PROCALCITON, LATICACIDVEN in the last 168 hours.  Recent Results (from the past 240 hour(s))  MRSA PCR Screening     Status: Abnormal   Collection Time: 01/01/16  9:45 PM  Result Value Ref Range Status   MRSA by PCR POSITIVE (A) NEGATIVE Final    Comment:        The GeneXpert MRSA Assay (FDA approved for NASAL specimens only), is one component of a comprehensive MRSA colonization surveillance program. It is not intended to diagnose MRSA infection nor to guide or monitor treatment for MRSA infections. RESULT CALLED TO, READ BACK BY AND VERIFIED WITH: HAMILTON S AT 0027 ON [id number] BY [facility] K          Radiology Studies: Dg Chest [facility] 1 View  Result Date: 01/01/2016 CLINICAL DATA:  Altered mental status. Decreased level of consciousness. Increased lethargy. EXAM: PORTABLE CHEST 1 VIEW COMPARISON:  08/09/2015 FINDINGS: Patient is rotated to the right. Heart size and mediastinal contours are unchanged allowing for rotation. There is atherosclerosis of the thoracic aorta. Mild left basilar atelectasis. No confluent airspace disease. No pulmonary edema, pneumothorax or pleural effusion. Skin folds project  over the right chest. IMPRESSION: Mild left basilar atelectasis. Thoracic aortic atherosclerosis. Electronically Signed   By: Jeb Levering M.D.   On: 01/01/2016 23:33        Scheduled Meds: . aspirin  300 mg Rectal Daily  . carbidopa-levodopa  0.5 tablet Oral TID  . Chlorhexidine Gluconate Cloth  6 each Topical Q0600  . enoxaparin (LOVENOX) injection  40 mg Subcutaneous Q24H  . loratadine  10 mg Oral Daily  . mupirocin ointment  1 application Nasal BID  . white petrolatum  1 application Topical Daily   Continuous Infusions: . sodium chloride 75 mL/hr at 01/01/16 2316     LOS: 0 days    Time spent: 25 minutes  Kathie Dike, MD [facility] If 7PM-7AM, please contact night-coverage [email or web address] Password [facility] 01/02/2016, 10:42 AM

## 2016-01-02 NOTE — Clinical Social Work Note (Signed)
Clinical Social Work Assessment  Patient Details  Name: Catherine [name] MRN: [id number] Date of Birth: [birth year]  Date of referral:  01/02/16               Reason for consult:  Discharge Planning                Permission sought to share information with:  Chartered certified accountant granted to share information::  Yes, Verbal Permission Granted  Name::        Agency::  [facility]  Relationship::  facility  Contact Information:     Housing/Transportation Living arrangements for the past 2 months:  [place] of Information:  Facility, Friend/Neighbor Patient Interpreter Needed:  None Criminal Activity/Legal Involvement Pertinent to Current Situation/Hospitalization:  No - Comment as needed Significant Relationships:  Other(Comment) (POA) Lives with:  Facility Resident Do you feel safe going back to the place where you live?  No Need for family participation in patient care:  Yes (Comment)  Care giving concerns:  None reported. Pt is long term resident at ALF.    Social Worker assessment / plan:  CSW met with pt's POA, Ardyth Gal as pt is oriented to self only. Pt is a resident at [facility]. She is on AL unit and has 24/7 private duty sitters due to several falls in the past. Ardyth Gal indicates things have been going pretty well at facility. Per Tammy at [place], pt is able to ambulate very short distances and requires extensive assist with ADLs. Facility had just added home health RN. Okay for return. Pt had hip fracture in January and went to SNF. Pt has Parkinson's.   Employment status:  Retired Forensic scientist:  Managed Care PT Recommendations:  Not assessed at this time Information / Referral to community resources:  Other (Comment Required) (Return to [facility])  Patient/Family's Response to care:  Pt's POA requests return to [facility] when medically stable.   Patient/Family's Understanding of  and Emotional Response to Diagnosis, Current Treatment, and Prognosis:  Pt's POA spoke with MD this morning regarding pt.   Emotional Assessment Appearance:  Appears stated age Attitude/Demeanor/Rapport:  Unable to Assess Affect (typically observed):  Unable to Assess Orientation:  Oriented to Self Alcohol / Substance use:  Not Applicable Psych involvement (Current and /or in the community):  No (Comment)  Discharge Needs  Concerns to be addressed:  Discharge Planning Concerns Readmission within the last 30 days:  No Current discharge risk:  Cognitively Impaired Barriers to Discharge:  Continued Medical Work up   Salome Arnt, [place] 01/02/2016, 1:07 PM [phone]

## 2016-01-03 ENCOUNTER — Encounter (HOSPITAL_COMMUNITY): Payer: Self-pay | Admitting: *Deleted

## 2016-01-03 ENCOUNTER — Inpatient Hospital Stay (HOSPITAL_COMMUNITY): Payer: Medicare Other

## 2016-01-03 LAB — URINALYSIS, ROUTINE W REFLEX MICROSCOPIC
Bilirubin Urine: NEGATIVE
Glucose, UA: NEGATIVE mg/dL
Ketones, ur: 15 mg/dL — AB
NITRITE: NEGATIVE
PH: 6 (ref 5.0–8.0)
Protein, ur: 30 mg/dL — AB
SPECIFIC GRAVITY, URINE: 1.025 (ref 1.005–1.030)

## 2016-01-03 LAB — BASIC METABOLIC PANEL
ANION GAP: 13 (ref 5–15)
BUN: 29 mg/dL — ABNORMAL HIGH (ref 6–20)
CHLORIDE: 112 mmol/L — AB (ref 101–111)
CO2: 20 mmol/L — AB (ref 22–32)
Calcium: 8.7 mg/dL — ABNORMAL LOW (ref 8.9–10.3)
Creatinine, Ser: 1.16 mg/dL — ABNORMAL HIGH (ref 0.44–1.00)
GFR calc non Af Amer: 42 mL/min — ABNORMAL LOW (ref >60)
GFR, EST AFRICAN AMERICAN: 48 mL/min — AB (ref >60)
Glucose, Bld: 124 mg/dL — ABNORMAL HIGH (ref 65–99)
Potassium: 4 mmol/L (ref 3.5–5.1)
SODIUM: 145 mmol/L (ref 135–145)

## 2016-01-03 LAB — CBC
HEMATOCRIT: 35.9 % — AB (ref 36.0–46.0)
HEMOGLOBIN: 12 g/dL (ref 12.0–15.0)
MCH: 32.1 pg (ref 26.0–34.0)
MCHC: 33.4 g/dL (ref 30.0–36.0)
MCV: 96 fL (ref 78.0–100.0)
Platelets: 190 10*3/uL (ref 150–400)
RBC: 3.74 MIL/uL — AB (ref 3.87–5.11)
RDW: 13.4 % (ref 11.5–15.5)
WBC: 7.5 10*3/uL (ref 4.0–10.5)

## 2016-01-03 LAB — URINE MICROSCOPIC-ADD ON

## 2016-01-03 LAB — GLUCOSE, CAPILLARY: GLUCOSE-CAPILLARY: 126 mg/dL — AB (ref 65–99)

## 2016-01-03 MED ORDER — ORAL CARE MOUTH RINSE
15.0000 mL | Freq: Two times a day (BID) | OROMUCOSAL | Status: DC
  Administered 2016-01-03 – 2016-01-06 (×7): 15 mL via OROMUCOSAL

## 2016-01-03 MED ORDER — ENOXAPARIN SODIUM 30 MG/0.3ML ~~LOC~~ SOLN
30.0000 mg | SUBCUTANEOUS | Status: DC
  Administered 2016-01-03 – 2016-01-05 (×3): 30 mg via SUBCUTANEOUS
  Filled 2016-01-03 (×3): qty 0.3

## 2016-01-03 MED ORDER — CHLORHEXIDINE GLUCONATE 0.12 % MT SOLN
15.0000 mL | Freq: Two times a day (BID) | OROMUCOSAL | Status: DC
  Administered 2016-01-03 – 2016-01-06 (×7): 15 mL via OROMUCOSAL
  Filled 2016-01-03 (×6): qty 15

## 2016-01-03 MED ORDER — KCL IN DEXTROSE-NACL 20-5-0.45 MEQ/L-%-% IV SOLN
INTRAVENOUS | Status: DC
  Administered 2016-01-03 – 2016-01-04 (×3): via INTRAVENOUS

## 2016-01-03 MED ORDER — DEXTROSE 5 % IV SOLN
1.0000 g | INTRAVENOUS | Status: DC
  Administered 2016-01-03 – 2016-01-05 (×3): 1 g via INTRAVENOUS
  Filled 2016-01-03 (×5): qty 10

## 2016-01-03 MED ORDER — POTASSIUM CHLORIDE 2 MEQ/ML IV SOLN: INTRAVENOUS | Status: DC

## 2016-01-03 MED ORDER — ASPIRIN EC 325 MG PO TBEC: 325.0000 mg | DELAYED_RELEASE_TABLET | Freq: Every day | ORAL | Status: DC

## 2016-01-03 MED ORDER — ASPIRIN EC 325 MG PO TBEC
325.0000 mg | DELAYED_RELEASE_TABLET | Freq: Every day | ORAL | Status: DC
Start: 2016-01-03 — End: 2016-01-06
  Administered 2016-01-03 – 2016-01-06 (×4): 325 mg via ORAL
  Filled 2016-01-03 (×4): qty 1

## 2016-01-03 NOTE — Progress Notes (Signed)
PROGRESS NOTE    Catherine [name]  [id number] DOB: 17-Aug-1927 DOA: 01/01/2016 PCP: Renata Caprice, DO    Brief Narrative:  81 yof with a past hx of dementia, HTN, and obesity presented with complaints of AMS. Pt is a resident as Engineer, materials living. Pt was dx with a UTI 12/22/15 and has completed a course of antibiotics. While in the ED she was noted to have an elevated troponin of 1.74. CXR revealed mild left basilar atelectasis. EKG showed T wave flattening in lateral leads. She was admitted for further evaluation of altered mental status.  Neurology was consulted and recommended NG tube placement to administer medications. CT head showed no abnormalities.    Assessment & Plan:   Active Problems:   Parkinson's disease ([place])   Parkinsonian tremor (HCC)   Altered mental status   Acute encephalopathy   NSTEMI (non-ST elevated myocardial infarction) (HCC)   HTN (hypertension)   Pressure ulcer  1. Acute encephalopathy. Possibly related to polypharmacy, although etiology is not entirely clear. Seroquel, ativan and trazodone currently on hold. Urinalysis did not show signs of infection and chest xray is unremarkable. CT head unremarkable. ABG and ammonia also normal. Neurology consult appreciated. Patient did have a mild fever this morning. Will repeat urinalysis, check blood cultures. Will also check EEG.  2. Parkinson Disease. Continue sinemet and amantadine. She has a resting tremor in right hand. NG tube placed so that she could get her medications. 3. NSTEMI. Troponin on admission was 1.7, trending down to 1.24. EKG showed sinus rhythm. Sitter reports that patient had been complaining of intermittent chest pain over the last few days. She does not appear to have had any previous cardiac work up. Will check echo. Continue on aspirin. She does not appear to be a good candidate for invasive cardiac work up at this time. 4. HTN. Stable. Monitor closely.     DVT prophylaxis: Lovenox Code  Status: DNR Family Communication: discussed with POA Alla Feeling at the bedside Disposition Plan: Discharge back to [facility] once improved    Consultants:   Cardiology  Neurology  Procedures:   NG tube 8/25  Antimicrobials:   None    Subjective: unresponsive  Objective: Vitals:   01/02/16 1500 01/02/16 1844 01/02/16 2050 01/03/16 0536  BP: 117/62  (!) 145/56 (!) 116/41  Pulse: (!) 146  83 88  Resp: 20  20 20   Temp: 98.2 F (36.8 C)  99.1 F (37.3 C) 99.1 F (37.3 C)  TempSrc: Axillary  Axillary Axillary  SpO2:  95% 97% 95%  Weight:        Intake/Output Summary (Last 24 hours) at 01/03/16 0650 Last data filed at 01/02/16 1800  Gross per 24 hour  Intake                0 ml  Output                0 ml  Net                0 ml   Filed Weights   01/02/16 0122  Weight: 51 kg (112 lb 7 oz)    Examination:   General exam: unresponsive Respiratory system: Clear to auscultation. Respiratory effort normal. Cardiovascular system: S1 & S2 heard, RRR. No JVD, murmurs, rubs, gallops or clicks. No pedal edema. Gastrointestinal system: Abdomen is nondistended, soft and nontender. No organomegaly or masses felt. Normal bowel sounds heard. Central nervous system: unresponsive Skin: No rashes, lesions or ulcers Psychiatry:  unresponsive.    Data Reviewed: I have personally reviewed following labs and imaging studies  CBC:  Recent Labs Lab 12/28/15 0106 01/01/16 1800 01/02/16 0336  WBC 6.2 7.6 11.7*  NEUTROABS  --  4.8  --   HGB 12.5 13.7 13.2  HCT 36.8 40.7 39.1  MCV 95.3 95.3 94.7  PLT 165 204 [id number]   Basic Metabolic Panel:  Recent Labs Lab 12/28/15 0106 01/01/16 1800 01/02/16 0336  NA 136 138 141  K 3.6 3.9 4.4  CL 107 106 105  CO2 24 28 23   GLUCOSE 172* 127* 128*  BUN 20 18 21*  CREATININE 1.25* 0.96 1.16*  CALCIUM 8.4* 9.2 9.6   GFR: Estimated Creatinine Clearance: 25.5 mL/min (by C-G formula based on SCr of 1.16 mg/dL). Liver Function  Tests:  Recent Labs Lab 01/02/16 0336  AST 30  ALT 6*  ALKPHOS 49  BILITOT 0.9  PROT 6.7  ALBUMIN 3.9    Recent Labs Lab 01/02/16 1809  AMMONIA 22   Cardiac Enzymes:  Recent Labs Lab 12/28/15 0106 01/01/16 1800 01/01/16 2143 01/02/16 0336 01/02/16 0952  TROPONINI <0.03 1.74* 1.32* 1.32* 1.24*   CBG:  Recent Labs Lab 01/02/16 0124 01/02/16 0803 01/02/16 1217 01/02/16 1611 01/02/16 2020  GLUCAP 140* 108* 128* 126* 116*   Lipid Profile: No results for input(s): CHOL, HDL, LDLCALC, TRIG, CHOLHDL, LDLDIRECT in the last 72 hours. Thyroid Function Tests:  Recent Labs  01/01/16 1800  TSH 3.102    Recent Results (from the past 240 hour(s))  MRSA PCR Screening     Status: Abnormal   Collection Time: 01/01/16  9:45 PM  Result Value Ref Range Status   MRSA by PCR POSITIVE (A) NEGATIVE Final    Comment:        The GeneXpert MRSA Assay (FDA approved for NASAL specimens only), is one component of a comprehensive MRSA colonization surveillance program. It is not intended to diagnose MRSA infection nor to guide or monitor treatment for MRSA infections. RESULT CALLED TO, READ BACK BY AND VERIFIED WITH: HAMILTON S AT 0027 ON [id number] BY FORSYTH K      Radiology Studies: Ct Head Wo Contrast  Result Date: 01/02/2016 CLINICAL DATA:  Altered mental status EXAM: CT HEAD WITHOUT CONTRAST TECHNIQUE: Contiguous axial images were obtained from the base of the skull through the vertex without intravenous contrast. COMPARISON:  08/08/2015 FINDINGS: Brain: There is no evidence for acute hemorrhage, hydrocephalus, mass lesion, or abnormal extra-axial fluid collection. No definite CT evidence for acute infarction. Diffuse loss of parenchymal volume is consistent with atrophy. Patchy low attenuation in the deep hemispheric and periventricular white matter is nonspecific, but likely reflects chronic microvascular ischemic demyelination. Vascular: Atherosclerotic calcification is  visualized in the carotid arteries. No dense MCA sign. Major dural sinuses are unremarkable. Skull: No evidence for skull fracture. Sinuses/Orbits: The visualized paranasal sinuses and mastoid air cells are clear. Visualized portions of the globes and intraorbital fat are unremarkable. Other: Unremarkable IMPRESSION: Stable exam.  No acute intracranial abnormality. Atrophy with chronic small vessel white matter ischemic demyelination. Electronically Signed   By: Misty Ruhland M.D.   On: 01/02/2016 20:31   Dg Chest Port 1 View  Result Date: 01/03/2016 CLINICAL DATA:  NG tube placement. EXAM: PORTABLE CHEST 1 VIEW COMPARISON:  Radiograph 01/01/2016 FINDINGS: Tip of the enteric tube below the diaphragm in the stomach, the side port is in the region of the gastroesophageal junction. Advancement of at least 4 cm would lead optimal positioning. Diffuse  increase in left lung opacity compared to right may be due to rotation versus multifocal atelectasis. Heart size and mediastinal contours are normal. IMPRESSION: 1. Tip of the enteric tube below the diaphragm in the stomach, side-port in the region of the gastroesophageal junction. Advancement of at least 4 cm recommended for optimal placement. 2. Increased left lung opacities may be due to rotation versus multifocal atelectasis. Electronically Signed   By: Jeb Levering M.D.   On: 01/03/2016 02:21   Dg Chest Port 1 View  Result Date: 01/01/2016 CLINICAL DATA:  Altered mental status. Decreased level of consciousness. Increased lethargy. EXAM: PORTABLE CHEST 1 VIEW COMPARISON:  08/09/2015 FINDINGS: Patient is rotated to the right. Heart size and mediastinal contours are unchanged allowing for rotation. There is atherosclerosis of the thoracic aorta. Mild left basilar atelectasis. No confluent airspace disease. No pulmonary edema, pneumothorax or pleural effusion. Skin folds project over the right chest. IMPRESSION: Mild left basilar atelectasis. Thoracic aortic  atherosclerosis. Electronically Signed   By: Jeb Levering M.D.   On: 01/01/2016 23:33    Scheduled Meds: . amantadine  100 mg Oral BID  . aspirin  300 mg Rectal Daily  . carbidopa-levodopa  0.5 tablet Oral TID  . chlorhexidine  15 mL Mouth Rinse BID  . Chlorhexidine Gluconate Cloth  6 each Topical Q0600  . enoxaparin (LOVENOX) injection  40 mg Subcutaneous Q24H  . loratadine  10 mg Oral Daily  . mouth rinse  15 mL Mouth Rinse q12n4p  . mupirocin ointment  1 application Nasal BID  . white petrolatum   Topical Daily   Continuous Infusions: . sodium chloride 75 mL/hr at 01/02/16 1625     LOS: 1 day   Time spent: 25 minutes   Kathie Dike, MD [facility] If 7PM-7AM, please contact night-coverage [email or web address] Password [email or web address] 01/03/2016, 6:50 AM

## 2016-01-04 ENCOUNTER — Inpatient Hospital Stay (HOSPITAL_COMMUNITY): Payer: Medicare Other

## 2016-01-04 DIAGNOSIS — N390 Urinary tract infection, site not specified: Secondary | ICD-10-CM

## 2016-01-04 LAB — BASIC METABOLIC PANEL
ANION GAP: 4 — AB (ref 5–15)
BUN: 23 mg/dL — AB (ref 6–20)
CO2: 24 mmol/L (ref 22–32)
Calcium: 8.3 mg/dL — ABNORMAL LOW (ref 8.9–10.3)
Chloride: 114 mmol/L — ABNORMAL HIGH (ref 101–111)
Creatinine, Ser: 0.86 mg/dL (ref 0.44–1.00)
GFR, EST NON AFRICAN AMERICAN: 60 mL/min — AB (ref >60)
Glucose, Bld: 133 mg/dL — ABNORMAL HIGH (ref 65–99)
POTASSIUM: 3.7 mmol/L (ref 3.5–5.1)
SODIUM: 142 mmol/L (ref 135–145)

## 2016-01-04 LAB — ECHOCARDIOGRAM COMPLETE: Weight: 1798.95 oz

## 2016-01-04 LAB — CBC
HEMATOCRIT: 34.6 % — AB (ref 36.0–46.0)
HEMOGLOBIN: 11.5 g/dL — AB (ref 12.0–15.0)
MCH: 31.7 pg (ref 26.0–34.0)
MCHC: 33.2 g/dL (ref 30.0–36.0)
MCV: 95.3 fL (ref 78.0–100.0)
Platelets: 184 10*3/uL (ref 150–400)
RBC: 3.63 MIL/uL — AB (ref 3.87–5.11)
RDW: 13.3 % (ref 11.5–15.5)
WBC: 8.2 10*3/uL (ref 4.0–10.5)

## 2016-01-04 NOTE — Progress Notes (Signed)
PROGRESS NOTE    Catherine [name]  [id number] DOB: 08/24/1927 DOA: 01/01/2016 PCP: Renata Caprice, DO    Brief Narrative:  26 yof with a past hx of dementia, HTN, and obesity presented with complaints of AMS. Pt is a resident as Engineer, materials living. Pt was dx with a UTI 12/22/15 and has completed a course of antibiotics. While in the ED she was noted to have an elevated troponin of 1.74. CXR revealed mild left basilar atelectasis. EKG showed T wave flattening in lateral leads. She was admitted for further evaluation of altered mental status. Since her admission, her troponin has some improvement. Neurology was consulted and recommended NG tube placement to administer medications. CT head showed no abnormalities. Urinalysis indicated infection and she was started on rocephin with improvement in mental status.   Assessment & Plan:   Active Problems:   Parkinson's disease ([place])   Parkinsonian tremor (HCC)   Altered mental status   Acute encephalopathy   NSTEMI (non-ST elevated myocardial infarction) (HCC)   HTN (hypertension)   Pressure ulcer  1. Acute encephalopathy. Possibly related to polypharmacy vs. UTI. Seroquel, ativan and trazodone currently on hold. Chest xray is unremarkable. CT head unremarkable. ABG and ammonia also normal. Neurology consult appreciated. Blood cultures showed no growth in 24 hours. Repeat UA on 8/26 was indicative of infection. She was started on Rocephin for possible UTI. Overall mental status is improving. 2. UTI. Patient had urinalysis yesterday that was indicative of UTI. Urine culture pending. Started on rocephin  3. Parkinson Disease. Continue sinemet and amantadine. She has a resting tremor in right hand.  4. NSTEMI. Troponin on admission was 1.7, trending down to 1.24. EKG showed sinus rhythm. Sitter reports that patient had been complaining of intermittent chest pain prior to admission. She does not appear to have had any previous cardiac work up. Will  check echo. Continue on aspirin. She does not appear to be a good candidate for invasive cardiac work up at this time. 5. HTN. Remains stable. Continue to monitor     DVT prophylaxis: Lovenox Code Status: DNR Family Communication: discussed with HCPOA at bedside Disposition Plan: Discharge back to [facility] once improved    Consultants:   Neurology  Procedures:   NG tube 8/25>>8/27  Antimicrobials:   Rocephin 8/26 >>    Subjective: Patient is more awake today. She is able to answer some questions. Denies any shortness of breath or cough  Objective: Vitals:   01/03/16 0926 01/03/16 1300 01/03/16 1430 01/03/16 2348  BP:  (!) 118/50 (!) 128/51 125/60  Pulse:  84 75 74  Resp:  17 18 18   Temp: 100.1 F (37.8 C) 98.6 F (37 C) 98.7 F (37.1 C) 98.5 F (36.9 C)  TempSrc: Axillary Axillary  Axillary  SpO2:  94% 98% 96%  Weight:        Intake/Output Summary (Last 24 hours) at 01/04/16 0648 Last data filed at 01/03/16 1700  Gross per 24 hour  Intake            382.5 ml  Output                0 ml  Net            382.5 ml   Filed Weights   01/02/16 0122  Weight: 51 kg (112 lb 7 oz)    Examination:   General exam: Appears calm and comfortable  Respiratory system: Clear to auscultation. Respiratory effort normal. Cardiovascular system: S1 & S2  heard, RRR. No JVD, murmurs, rubs, gallops or clicks. No pedal edema. Gastrointestinal system: Abdomen is nondistended, soft and nontender. No organomegaly or masses felt. Normal bowel sounds heard. Central nervous system: she is more alert today. No focal neurological deficits. Extremities: Symmetric 5 x 5 power. Skin: No rashes, lesions or ulcers Psychiatry: Judgement and insight appear normal. Mood & affect appropriate.    Data Reviewed: I have personally reviewed following labs and imaging studies  CBC:  Recent Labs Lab 01/01/16 1800 01/02/16 0336 01/03/16 0614 01/04/16 0555  WBC 7.6 11.7* 7.5 8.2  NEUTROABS  4.8  --   --   --   HGB 13.7 13.2 12.0 11.5*  HCT 40.7 39.1 35.9* 34.6*  MCV 95.3 94.7 96.0 95.3  PLT 204 220 190 [id number]   Basic Metabolic Panel:  Recent Labs Lab 01/01/16 1800 01/02/16 0336 01/03/16 0614  NA 138 141 145  K 3.9 4.4 4.0  CL 106 105 112*  CO2 28 23 20*  GLUCOSE 127* 128* 124*  BUN 18 21* 29*  CREATININE 0.96 1.16* 1.16*  CALCIUM 9.2 9.6 8.7*   GFR: Estimated Creatinine Clearance: 25.5 mL/min (by C-G formula based on SCr of 1.16 mg/dL). Liver Function Tests:  Recent Labs Lab 01/02/16 0336  AST 30  ALT 6*  ALKPHOS 49  BILITOT 0.9  PROT 6.7  ALBUMIN 3.9    Recent Labs Lab 01/02/16 1809  AMMONIA 22   Cardiac Enzymes:  Recent Labs Lab 01/01/16 1800 01/01/16 2143 01/02/16 0336 01/02/16 0952  TROPONINI 1.74* 1.32* 1.32* 1.24*   CBG:  Recent Labs Lab 01/02/16 0803 01/02/16 1217 01/02/16 1611 01/02/16 2020 01/03/16 0720  GLUCAP 108* 128* 126* 116* 126*   Lipid Profile: No results for input(s): CHOL, HDL, LDLCALC, TRIG, CHOLHDL, LDLDIRECT in the last 72 hours. Thyroid Function Tests:  Recent Labs  01/01/16 1800  TSH 3.102    Recent Results (from the past 240 hour(s))  MRSA PCR Screening     Status: Abnormal   Collection Time: 01/01/16  9:45 PM  Result Value Ref Range Status   MRSA by PCR POSITIVE (A) NEGATIVE Final    Comment:        The GeneXpert MRSA Assay (FDA approved for NASAL specimens only), is one component of a comprehensive MRSA colonization surveillance program. It is not intended to diagnose MRSA infection nor to guide or monitor treatment for MRSA infections. RESULT CALLED TO, READ BACK BY AND VERIFIED WITH: HAMILTON S AT 0027 ON [id number] BY FORSYTH K   Culture, blood (routine x 2)     Status: None (Preliminary result)   Collection Time: 01/03/16  1:32 PM  Result Value Ref Range Status   Specimen Description BLOOD BLOOD LEFT HAND  Final   Special Requests BOTTLES DRAWN AEROBIC AND ANAEROBIC 8CC  Final    Culture PENDING  Incomplete   Report Status PENDING  Incomplete  Culture, blood (routine x 2)     Status: None (Preliminary result)   Collection Time: 01/03/16  1:44 PM  Result Value Ref Range Status   Specimen Description BLOOD LEFT ANTECUBITAL  Final   Special Requests BOTTLES DRAWN AEROBIC AND ANAEROBIC 8CC  Final   Culture PENDING  Incomplete   Report Status PENDING  Incomplete     Radiology Studies: Ct Head Wo Contrast  Result Date: 01/02/2016 CLINICAL DATA:  Altered mental status EXAM: CT HEAD WITHOUT CONTRAST TECHNIQUE: Contiguous axial images were obtained from the base of the skull through the vertex without intravenous contrast.  COMPARISON:  08/08/2015 FINDINGS: Brain: There is no evidence for acute hemorrhage, hydrocephalus, mass lesion, or abnormal extra-axial fluid collection. No definite CT evidence for acute infarction. Diffuse loss of parenchymal volume is consistent with atrophy. Patchy low attenuation in the deep hemispheric and periventricular white matter is nonspecific, but likely reflects chronic microvascular ischemic demyelination. Vascular: Atherosclerotic calcification is visualized in the carotid arteries. No dense MCA sign. Major dural sinuses are unremarkable. Skull: No evidence for skull fracture. Sinuses/Orbits: The visualized paranasal sinuses and mastoid air cells are clear. Visualized portions of the globes and intraorbital fat are unremarkable. Other: Unremarkable IMPRESSION: Stable exam.  No acute intracranial abnormality. Atrophy with chronic small vessel white matter ischemic demyelination. Electronically Signed   By: Misty Clair M.D.   On: 01/02/2016 20:31   Dg Chest Port 1 View  Result Date: 01/03/2016 CLINICAL DATA:  NG tube placement. EXAM: PORTABLE CHEST 1 VIEW COMPARISON:  Radiograph 01/01/2016 FINDINGS: Tip of the enteric tube below the diaphragm in the stomach, the side port is in the region of the gastroesophageal junction. Advancement of at least 4  cm would lead optimal positioning. Diffuse increase in left lung opacity compared to right may be due to rotation versus multifocal atelectasis. Heart size and mediastinal contours are normal. IMPRESSION: 1. Tip of the enteric tube below the diaphragm in the stomach, side-port in the region of the gastroesophageal junction. Advancement of at least 4 cm recommended for optimal placement. 2. Increased left lung opacities may be due to rotation versus multifocal atelectasis. Electronically Signed   By: Jeb Levering M.D.   On: 01/03/2016 02:21    Scheduled Meds: . amantadine  100 mg Oral BID  . aspirin EC  325 mg Oral Daily  . carbidopa-levodopa  0.5 tablet Oral TID  . cefTRIAXone (ROCEPHIN)  IV  1 g Intravenous Q24H  . chlorhexidine  15 mL Mouth Rinse BID  . Chlorhexidine Gluconate Cloth  6 each Topical Q0600  . enoxaparin (LOVENOX) injection  30 mg Subcutaneous Q24H  . loratadine  10 mg Oral Daily  . mouth rinse  15 mL Mouth Rinse q12n4p  . mupirocin ointment  1 application Nasal BID  . white petrolatum   Topical Daily   Continuous Infusions: . dextrose 5 % and 0.45 % NaCl with KCl 20 mEq/L 75 mL/hr at 01/04/16 0256     LOS: 2 days   Time spent: 25 minutes   Kathie Dike, MD [facility] If 7PM-7AM, please contact night-coverage [email or web address] Password [facility] 01/04/2016, 6:48 AM

## 2016-01-04 NOTE — Progress Notes (Signed)
Patient more alert today and responding appropriately.  Alert to self and place.  NG tube DC'd and started on full liquids.  Patient ate 100% of jello, pudding, and soup.  Has some difficulty sipping through a straw, but tolerates small sips via cup well.  Had one small, brief cough, but otherwise tolerated well.

## 2016-01-04 NOTE — Progress Notes (Signed)
*  PRELIMINARY RESULTS* Echocardiogram 2D Echocardiogram has been performed.  [name] 01/04/2016, 8:54 AM

## 2016-01-05 DIAGNOSIS — I214 Non-ST elevation (NSTEMI) myocardial infarction: Secondary | ICD-10-CM

## 2016-01-05 LAB — CBC
HCT: 36.7 % (ref 36.0–46.0)
Hemoglobin: 12.4 g/dL (ref 12.0–15.0)
MCH: 32.4 pg (ref 26.0–34.0)
MCHC: 33.8 g/dL (ref 30.0–36.0)
MCV: 95.8 fL (ref 78.0–100.0)
PLATELETS: 189 10*3/uL (ref 150–400)
RBC: 3.83 MIL/uL — ABNORMAL LOW (ref 3.87–5.11)
RDW: 13.3 % (ref 11.5–15.5)
WBC: 6.9 10*3/uL (ref 4.0–10.5)

## 2016-01-05 LAB — BASIC METABOLIC PANEL
ANION GAP: 5 (ref 5–15)
BUN: 12 mg/dL (ref 6–20)
CALCIUM: 8.5 mg/dL — AB (ref 8.9–10.3)
CO2: 24 mmol/L (ref 22–32)
CREATININE: 0.66 mg/dL (ref 0.44–1.00)
Chloride: 111 mmol/L (ref 101–111)
Glucose, Bld: 126 mg/dL — ABNORMAL HIGH (ref 65–99)
POTASSIUM: 3.8 mmol/L (ref 3.5–5.1)
SODIUM: 140 mmol/L (ref 135–145)

## 2016-01-05 MED ORDER — MAGNESIUM CITRATE PO SOLN
1.0000 | Freq: Once | ORAL | Status: AC
  Administered 2016-01-05: 1 via ORAL
  Filled 2016-01-05: qty 296

## 2016-01-05 NOTE — Care Management Important Message (Signed)
Important Message  Patient Details  Name: EMBERLEIGH THAPA MRN: [id number] Date of Birth: Mar 06, 1928   Medicare Important Message Given:  Yes    Marquelle Balow, [place], RN 01/05/2016, 1:37 PM

## 2016-01-05 NOTE — Consult Note (Signed)
Reason for Consult:   Elevated Troponin  Requesting Physician: [facility] Primary Cardiologist Dr Debara Pickett (2014)  HPI:   Pleasant 82 y/o female with a history of Parkinson's, resident of [place]. She is significantly debilitated and not ambulatory. She has no history of CAD or MI. She saw Dr Debara Pickett in 2014 for palpitations and had a short run of PAT documented on Holter. Beta blocker was recommended. The patient was seen in the ED 12/28/15 after a syncopal episode while on the commode. She had recently been diagnosed with a UTI and had been in [place]. In the ED she was hypotensive and they felt that was the etiology of her syncope. Ekg and Troponin negative then. She was admitted again through the ED at [facility] 01/01/16 with mental status change and had an elevated Troponin. She denies chest pain. Echo shows an EF of 60-65% with inferior/ basilar HK. EKG shows no acute changes. Her Troponin was normal 12/28/15-<0.03. On admission 8/24 her Troponin was 1.74, today 1.24.    PMHx:  Past Medical History:  Diagnosis Date  . Arthritis   . Cancer ([place])    basal cell-forehead  . Dementia in Parkinson's disease ([place]) 12/25/2015  . Diabetes mellitus without complication ([place])   . Dyslipidemia   . Hypertension   . Migraine without aura, without mention of intractable migraine without mention of status migrainosus   . Obese   . Paralysis agitans ([place]) 09/20/2012  . Parkinson's disease ([place])   . Sciatica of left side   . Tremor     Past Surgical History:  Procedure Laterality Date  . APPENDECTOMY    . Arthroscopic surgery     Left knee  . basal cell carcinoma resection     Forehead  . CATARACT EXTRACTION W/PHACO Right 11/19/2013   Procedure: CATARACT EXTRACTION PHACO AND INTRAOCULAR LENS PLACEMENT (IOC);  Surgeon: Tonny Branch, MD;  Location: [facility];  Service: Ophthalmology;  Laterality: Right;  CDE:  12.95  . CATARACT EXTRACTION W/PHACO Left 12/17/2013   Procedure: CATARACT  EXTRACTION PHACO AND INTRAOCULAR LENS PLACEMENT (IOC);  Surgeon: Tonny Branch, MD;  Location: [facility];  Service: Ophthalmology;  Laterality: Left;  CDE 12.10  . HIP PINNING,CANNULATED Left 05/23/2015   Procedure: INTERNAL FIXATION LEFT HIP;  Surgeon: Carole Civil, MD;  Location: [facility];  Service: Orthopedics;  Laterality: Left;  . MOUTH SURGERY    . TONSILLECTOMY    . trigger finger surgery     Bilateral thumb    SOCHx:  reports that she has never smoked. She has never used smokeless tobacco. She reports that she does not drink alcohol or use drugs.  FAMHx: Family History  Problem Relation Age of Onset  . Rheum arthritis Mother   . Heart attack Father     not certain of COD  . Hypertension Paternal Grandfather   . Stroke Paternal Grandfather   . Cirrhosis Paternal Grandmother     ALLERGIES: Allergies  Allergen Reactions  . Sulfa Antibiotics Rash  . Azilect [Rasagiline] Rash  . Fenofibrate Rash  . Sinemet [Carbidopa-Levodopa] Rash  . Tape Rash and Other (See Comments)    Redness  . Tomato Rash    Pt can eat raw tomatoes.Marland Kitchenate tomato sandwich a few days ago    ROS: Review of Systems: General: negative for chills, fever, night sweats or weight changes.  Cardiovascular: negative for chest pain, dyspnea on exertion, edema, orthopnea, palpitations, paroxysmal nocturnal dyspnea or shortness of breath  HEENT: negative for any visual disturbances, blindness, glaucoma Dermatological: negative for rash Respiratory: negative for cough, hemoptysis, or wheezing Urologic: negative for hematuria or dysuria Abdominal: negative for nausea, vomiting, diarrhea, bright red blood per rectum, melena, or hematemesis Neurologic: negative for visual changes, syncope, or dizziness Musculoskeletal: negative for back pain, joint pain, or swelling Psych: cooperative and appropriate All other systems reviewed and are otherwise negative except as noted above.   HOME MEDICATIONS: Prior to  Admission medications   Medication Sig Start Date End Date Taking? Authorizing Provider  aspirin EC 81 MG tablet Take 81 mg by mouth daily.   Yes Historical Provider, MD  carbidopa-levodopa (SINEMET IR) 25-100 MG tablet Take 0.5 tablets by mouth 3 (three) times daily. 10/20/15  Yes Ward Givens, NP  carboxymethylcellulose 1 % ophthalmic solution 1 drop 3 (three) times daily.   Yes Historical Provider, MD  Control Gel Formula Dressing (DUODERM CGF DRESSING EX) Apply 1 application topically every 3 (three) days. Applied to right buttock wound every 3 days   Yes Historical Provider, MD  docusate sodium (COLACE) 100 MG capsule Take 1 capsule (100 mg total) by mouth 2 (two) times daily. 05/25/15  Yes Annita Brod, MD  fexofenadine (ALLEGRA) 180 MG tablet Take 180 mg by mouth daily.   Yes Historical Provider, MD  furosemide (LASIX) 20 MG tablet Take 20 mg by mouth daily.    Yes Historical Provider, MD  LORazepam (ATIVAN) 0.5 MG tablet Take 0.5 mg by mouth 2 (two) times daily.   Yes Historical Provider, MD  meloxicam (MOBIC) 7.5 MG tablet Take 7.5 mg by mouth daily.   Yes Historical Provider, MD  mineral oil liquid Place into both ears every Monday. Instill 2 drops in both ears once daily every Monday for Cerumen Impaction.   Yes Historical Provider, MD  Multiple Vitamin (MULTIVITAMIN) capsule Take 1 capsule by mouth daily.   Yes Historical Provider, MD  naproxen sodium (ANAPROX) 220 MG tablet Take 220 mg by mouth 2 (two) times daily as needed (pain).   Yes Historical Provider, MD  ondansetron (ZOFRAN) 4 MG tablet Take 4 mg by mouth every 6 (six) hours as needed for nausea or vomiting.    Yes Historical Provider, MD  polyethylene glycol (MIRALAX / GLYCOLAX) packet Take 17 g by mouth daily as needed for mild constipation.   Yes Historical Provider, MD  potassium chloride (K-DUR) 10 MEQ tablet Take 10 mEq by mouth daily.   Yes Historical Provider, MD  QUEtiapine (SEROQUEL) 25 MG tablet Take 25-75 mg by  mouth 2 (two) times daily. Take one tab daily and three tabs at bedtime   Yes Historical Provider, MD  ramipril (ALTACE) 2.5 MG capsule Take 2.5 mg by mouth daily.   Yes Historical Provider, MD  senna (SENOKOT) 8.6 MG TABS tablet Take 1 tablet by mouth at bedtime.   Yes Historical Provider, MD  traZODone (DESYREL) 50 MG tablet Take 50 mg by mouth at bedtime.   Yes Historical Provider, MD  white petrolatum (VASELINE) GEL Apply 1 application topically daily. Applied to bottom, peri area, and thighs topically for rash   Yes Historical Provider, MD  ciprofloxacin (CIPRO) 250 MG tablet Take 250 mg by mouth 2 (two) times daily.    Historical Provider, MD    HOSPITAL MEDICATIONS: I have reviewed the patient's current medications.  VITALS: Blood pressure (!) 121/41, pulse 66, temperature 98.2 F (36.8 C), temperature source Axillary, resp. rate 20, weight 112 lb 7 oz (51 kg), SpO2 97 %.  PHYSICAL EXAM: General appearance: alert, cooperative, cachectic, no distress and debilitated Neck: no carotid bruit and no JVD Lungs: basilar crackles Lt> Rt Heart: regular rate and rhythm Abdomen: soft, non-tender; bowel sounds normal; no masses,  no organomegaly Extremities: no edema Pulses: 2+ and symmetric Skin: pale, cool, dry Neurologic: resting tremor she is alert and oriented  LABS: Results for orders placed or performed during the hospital encounter of 01/01/16 (from the past 24 hour(s))  CBC     Status: Abnormal   Collection Time: 01/05/16  6:14 AM  Result Value Ref Range   WBC 6.9 4.0 - 10.5 K/uL   RBC 3.83 (L) 3.87 - 5.11 MIL/uL   Hemoglobin 12.4 12.0 - 15.0 g/dL   HCT 36.7 36.0 - 46.0 %   MCV 95.8 78.0 - 100.0 fL   MCH 32.4 26.0 - 34.0 pg   MCHC 33.8 30.0 - 36.0 g/dL   RDW 13.3 11.5 - 15.5 %   Platelets 189 150 - 400 K/uL  Basic metabolic panel     Status: Abnormal   Collection Time: 01/05/16  6:14 AM  Result Value Ref Range   Sodium 140 135 - 145 mmol/L   Potassium 3.8 3.5 - 5.1  mmol/L   Chloride 111 101 - 111 mmol/L   CO2 24 22 - 32 mmol/L   Glucose, Bld 126 (H) 65 - 99 mg/dL   BUN 12 6 - 20 mg/dL   Creatinine, Ser 0.66 0.44 - 1.00 mg/dL   Calcium 8.5 (L) 8.9 - 10.3 mg/dL   GFR calc non Af Amer >60 >60 mL/min   GFR calc Af Amer >60 >60 mL/min   Anion gap 5 5 - 15    EKG: NSR, poor R wave progression    IMAGING: CXR 8/26 /17- IMPRESSION: 1. Tip of the enteric tube below the diaphragm in the stomach, side-port in the region of the gastroesophageal junction. Advancement of at least 4 cm recommended for optimal placement. 2. Increased left lung opacities may be due to rotation versus multifocal atelectasis.   IMPRESSION: Principal Problem:   NSTEMI (non-ST elevated myocardial infarction) ([place]) Active Problems:   Altered mental status   Parkinson's disease ([place])   HTN (hypertension)   Pressure ulcer   UTI (urinary tract infection)    RECOMMENDATION: Consider addition of low dose beta blocker and decreasing ASA to 81 mg daily. Doubt further work up for ischemia appropriate. MD to see.   Time Spent Directly with Patient: 53 minutes  Kerin Ransom, [place] beeper 01/05/2016, 8:28 AM   Pt seen and examined  I agree with findings as documented by L Kilroy above   Pt is currently comfortable  Per familly who is present in [place], pt had chest pain several days prior to admit  Patient does not remember  On exam:  Lungs CTA  Cardiac RRR  No S3  Extremities are without edema  With pt's dementia, age I would recomm medical Rx  She is comfortable  Volume status is OK Try to ambulate over next day   Continue ASA  Follow BP   If increases could consider low dose b blocker   Will follow.  Dorris Carnes

## 2016-01-05 NOTE — Progress Notes (Signed)
PROGRESS NOTE    Catherine [name]  [id number] DOB: July 19, 1927 DOA: 01/01/2016 PCP: Renata Caprice, DO    Brief Narrative:  3 yof with a past hx of dementia, HTN, and obesity presented with complaints of AMS. Pt is a resident as Engineer, materials living. Pt was dx with a UTI 12/22/15 and has completed a course of antibiotics. While in the ED she was noted to have an elevated troponin of 1.74. CXR revealed mild left basilar atelectasis. EKG showed T wave flattening in lateral leads. She was admitted for further evaluation of altered mental status. Since her admission, her troponin has some improvement. Neurology was consulted and recommended NG tube placement to administer medications. CT head showed no abnormalities. Urinalysis indicated infection and she was started on rocephin with improvement in mental status.   Assessment & Plan:   Active Problems:   Parkinson's disease ([place])   Parkinsonian tremor ([facility])   Altered mental status   Acute encephalopathy   NSTEMI (non-ST elevated myocardial infarction) ([facility])   HTN (hypertension)   Pressure ulcer   UTI (urinary tract infection) 1. Acute encephalopathy. Possibly related to polypharmacy vs. UTI. Seroquel, ativan and trazodone currently on hold. Chest xray is unremarkable. CT head unremarkable. ABG and ammonia also normal. Neurology consult appreciated. Blood cultures showed no growth in 2 days. Repeat UA on 8/26 was indicative of infection. She was started on Rocephin for possible UTI. Mental status continues to improve.  2. UTI. Patient had urinalysis that was indicative of UTI. Urine culture pending. Continue Rocephin.  3. Parkinson Disease. Continue sinemet and amantadine. She has a resting tremor in right hand.  4. NSTEMI. Troponin on admission was 1.7, trending down to 1.24. EKG showed sinus rhythm. Sitter reports that patient had been complaining of intermittent chest pain prior to admission. She does not complain of chest pain at this time.  She does not appear to have had any previous cardiac work up. Echo shows normal EF with some wall motion abnormalities. Continue on aspirin. Await further input from cardiology. 5. HTN. Remains stable. Continue to monitor   DVT prophylaxis: Lovenox Code Status: DNR Family Communication: discussed with HCPOA at bedside Disposition Plan:Discharge back to [facility] once improved    Consultants:   Neurology   Procedures:  NG tube 8/25 >> 8/27  Antimicrobials:   Rocephin 8/26 >>   Subjective: No complaints of pain. No shortness of breath  Objective: Vitals:   01/04/16 1336 01/04/16 1944 01/04/16 2055 01/05/16 0540  BP: 125/60  122/60 (!) 121/41  Pulse: 100  67 66  Resp: 16   20  Temp: 98.7 F (37.1 C) 97.5 F (36.4 C) 98.4 F (36.9 C) 98.2 F (36.8 C)  TempSrc: Oral Oral Axillary Axillary  SpO2: 96%  96% 97%  Weight:        Intake/Output Summary (Last 24 hours) at 01/05/16 0734 Last data filed at 01/04/16 1724  Gross per 24 hour  Intake              360 ml  Output                0 ml  Net              360 ml   Filed Weights   01/02/16 0122  Weight: 51 kg (112 lb 7 oz)    Examination:  General exam: Appears calm and comfortable  Respiratory system: Clear to auscultation. Respiratory effort normal. Cardiovascular system: S1 & S2 heard, RRR. No  JVD, murmurs, rubs, gallops or clicks. No pedal edema. Gastrointestinal system: Abdomen is nondistended, soft and nontender. No organomegaly or masses felt. Normal bowel sounds heard. Central nervous system: Alert and oriented. No focal neurological deficits. Extremities: Symmetric 5 x 5 power. Skin: No rashes, lesions or ulcers Psychiatry: Judgement and insight appear normal. Mood & affect appropriate.     Data Reviewed: I have personally reviewed following labs and imaging studies  CBC:  Recent Labs Lab 01/01/16 1800 01/02/16 0336 01/03/16 0614 01/04/16 0555 01/05/16 0614  WBC 7.6 11.7* 7.5 8.2 6.9    NEUTROABS 4.8  --   --   --   --   HGB 13.7 13.2 12.0 11.5* 12.4  HCT 40.7 39.1 35.9* 34.6* 36.7  MCV 95.3 94.7 96.0 95.3 95.8  PLT 204 220 190 184 [id number]   Basic Metabolic Panel:  Recent Labs Lab 01/01/16 1800 01/02/16 0336 01/03/16 0614 01/04/16 0555 01/05/16 0614  NA 138 141 145 142 140  K 3.9 4.4 4.0 3.7 3.8  CL 106 105 112* 114* 111  CO2 28 23 20* 24 24  GLUCOSE 127* 128* 124* 133* 126*  BUN 18 21* 29* 23* 12  CREATININE 0.96 1.16* 1.16* 0.86 0.66  CALCIUM 9.2 9.6 8.7* 8.3* 8.5*   GFR: Estimated Creatinine Clearance: 36.9 mL/min (by C-G formula based on SCr of 0.8 mg/dL). Liver Function Tests:  Recent Labs Lab 01/02/16 0336  AST 30  ALT 6*  ALKPHOS 49  BILITOT 0.9  PROT 6.7  ALBUMIN 3.9   No results for input(s): LIPASE, AMYLASE in the last 168 hours.  Recent Labs Lab 01/02/16 1809  AMMONIA 22   Coagulation Profile: No results for input(s): INR, PROTIME in the last 168 hours. Cardiac Enzymes:  Recent Labs Lab 01/01/16 1800 01/01/16 2143 01/02/16 0336 01/02/16 0952  TROPONINI 1.74* 1.32* 1.32* 1.24*   BNP (last 3 results) No results for input(s): PROBNP in the last 8760 hours. HbA1C: No results for input(s): HGBA1C in the last 72 hours. CBG:  Recent Labs Lab 01/02/16 0803 01/02/16 1217 01/02/16 1611 01/02/16 2020 01/03/16 0720  GLUCAP 108* 128* 126* 116* 126*   Lipid Profile: No results for input(s): CHOL, HDL, LDLCALC, TRIG, CHOLHDL, LDLDIRECT in the last 72 hours. Thyroid Function Tests: No results for input(s): TSH, T4TOTAL, FREET4, T3FREE, THYROIDAB in the last 72 hours. Anemia Panel: No results for input(s): VITAMINB12, FOLATE, FERRITIN, TIBC, IRON, RETICCTPCT in the last 72 hours. Sepsis Labs: No results for input(s): PROCALCITON, LATICACIDVEN in the last 168 hours.  Recent Results (from the past 240 hour(s))  MRSA PCR Screening     Status: Abnormal   Collection Time: 01/01/16  9:45 PM  Result Value Ref Range Status   MRSA  by PCR POSITIVE (A) NEGATIVE Final    Comment:        The GeneXpert MRSA Assay (FDA approved for NASAL specimens only), is one component of a comprehensive MRSA colonization surveillance program. It is not intended to diagnose MRSA infection nor to guide or monitor treatment for MRSA infections. RESULT CALLED TO, READ BACK BY AND VERIFIED WITH: HAMILTON S AT 0027 ON [id number] BY FORSYTH K   Culture, blood (routine x 2)     Status: None (Preliminary result)   Collection Time: 01/03/16  1:32 PM  Result Value Ref Range Status   Specimen Description BLOOD BLOOD LEFT HAND  Final   Special Requests BOTTLES DRAWN AEROBIC AND ANAEROBIC 8CC  Final   Culture NO GROWTH < 24 HOURS  Final   Report Status PENDING  Incomplete  Culture, blood (routine x 2)     Status: None (Preliminary result)   Collection Time: 01/03/16  1:44 PM  Result Value Ref Range Status   Specimen Description BLOOD LEFT ANTECUBITAL  Final   Special Requests BOTTLES DRAWN AEROBIC AND ANAEROBIC 8CC  Final   Culture NO GROWTH < 24 HOURS  Final   Report Status PENDING  Incomplete         Radiology Studies: No results found.      Scheduled Meds: . amantadine  100 mg Oral BID  . aspirin EC  325 mg Oral Daily  . carbidopa-levodopa  0.5 tablet Oral TID  . cefTRIAXone (ROCEPHIN)  IV  1 g Intravenous Q24H  . chlorhexidine  15 mL Mouth Rinse BID  . Chlorhexidine Gluconate Cloth  6 each Topical Q0600  . enoxaparin (LOVENOX) injection  30 mg Subcutaneous Q24H  . loratadine  10 mg Oral Daily  . mouth rinse  15 mL Mouth Rinse q12n4p  . mupirocin ointment  1 application Nasal BID  . white petrolatum   Topical Daily   Continuous Infusions: . dextrose 5 % and 0.45 % NaCl with KCl 20 mEq/L 75 mL/hr at 01/04/16 1631     LOS: 3 days    Time spent: 25 minutes    Kathie Dike, MD [facility] If 7PM-7AM, please contact night-coverage [email or web address] Password [facility] 01/05/2016, 7:34 AM

## 2016-01-05 NOTE — Progress Notes (Signed)
Catherine A. Merlene Laughter, MD     [email or web address]          Catherine [name] is an 82 y.o. female.   Assessment/Plan: 1. Acute encephalopathy most likely due to multifactorial etiologies particular medication and parkinsonism. Noninfectious etiology uncovered.  2. 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   RECOMMENDATION:  We will continue with the current parkinsonian medications for now.    An NG tube was placed over the weekend with the patient getting her medications. She has improved significantly. The tube is now out. The family reports that she has been eating today.    GENERAL: Unresponsive.  HEENT: Severe axial rigidity.  ABDOMEN: soft  EXTREMITIES: No edema   BACK: Normal   SKIN: Normal by inspection.    MENTAL STATUS: She is awake and alert. She follows commands bilaterally. She still has significant parkinsonism but much improved. She speaks in simple sentences although speech is moderately hypophonic.  CRANIAL NERVES: Pupils are equal, round and reactive to light; extra ocular movements are full - passively, there is no significant nystagmus; visual fields are full; upper and lower facial muscles are symmetric, there is no flattening of the nasolabial folds.  MOTOR: 4/5 arms and leg 3/5.   COORDINATION: She has persistent moderate amplitude moderate frequency tremors bilaterally. The severe axial and appendicular rigidity is now mild to moderate. She still has significant bradykinesia about moderate in severity.  REFLEXES: Deep tendon reflexes are symmetrical and normal.   SENSATION: Normal to touch.      Objective: Vital signs in last 24 hours: Temp:  [97.5 F (36.4 C)-98.4 F (36.9 C)] 97.6 F (36.4 C) (08/28 1459) Pulse Rate:  [66-67] 66 (08/28 1459) Resp:  [18-20] 18 (08/28 1459) BP: (118-122)/(41-60) 118/57 (08/28 1459) SpO2:  [96 %-98 %] 96 % (08/28 1459)  Intake/Output from previous day: 08/27 0701 - 08/28  0700 In: 360 [P.O.:360] Out: -  Intake/Output this shift: Total I/O In: 240 [P.O.:240] Out: -  Nutritional status: Diet full liquid Room service appropriate? Yes; Fluid consistency: Thin   Lab Results: Results for orders placed or performed during the hospital encounter of 01/01/16 (from the past 48 hour(s))  Basic metabolic panel     Status: Abnormal   Collection Time: 01/04/16  5:55 AM  Result Value Ref Range   Sodium 142 135 - 145 mmol/L   Potassium 3.7 3.5 - 5.1 mmol/L   Chloride 114 (H) 101 - 111 mmol/L   CO2 24 22 - 32 mmol/L   Glucose, Bld 133 (H) 65 - 99 mg/dL   BUN 23 (H) 6 - 20 mg/dL   Creatinine, Ser 0.86 0.44 - 1.00 mg/dL   Calcium 8.3 (L) 8.9 - 10.3 mg/dL   GFR calc non Af Amer 60 (L) >60 mL/min   GFR calc Af Amer >60 >60 mL/min    Comment: (NOTE) The eGFR has been calculated using the CKD EPI equation. This calculation has not been validated in all clinical situations. eGFR's persistently <60 mL/min signify possible Chronic Kidney Disease.    Anion gap 4 (L) 5 - 15  CBC     Status: Abnormal   Collection Time: 01/04/16  5:55 AM  Result Value Ref Range   WBC 8.2 4.0 - 10.5 K/uL   RBC 3.63 (L) 3.87 - 5.11 MIL/uL   Hemoglobin 11.5 (L) 12.0 - 15.0 g/dL   HCT 34.6 (L) 36.0 - 46.0 %   MCV 95.3 78.0 - 100.0 fL   MCH 31.7 26.0 - 34.0 pg  MCHC 33.2 30.0 - 36.0 g/dL   RDW 13.3 11.5 - 15.5 %   Platelets 184 150 - 400 K/uL  CBC     Status: Abnormal   Collection Time: 01/05/16  6:14 AM  Result Value Ref Range   WBC 6.9 4.0 - 10.5 K/uL   RBC 3.83 (L) 3.87 - 5.11 MIL/uL   Hemoglobin 12.4 12.0 - 15.0 g/dL   HCT 36.7 36.0 - 46.0 %   MCV 95.8 78.0 - 100.0 fL   MCH 32.4 26.0 - 34.0 pg   MCHC 33.8 30.0 - 36.0 g/dL   RDW 13.3 11.5 - 15.5 %   Platelets 189 150 - 400 K/uL  Basic metabolic panel     Status: Abnormal   Collection Time: 01/05/16  6:14 AM  Result Value Ref Range   Sodium 140 135 - 145 mmol/L   Potassium 3.8 3.5 - 5.1 mmol/L   Chloride 111 101 - 111  mmol/L   CO2 24 22 - 32 mmol/L   Glucose, Bld 126 (H) 65 - 99 mg/dL   BUN 12 6 - 20 mg/dL   Creatinine, Ser 0.66 0.44 - 1.00 mg/dL   Calcium 8.5 (L) 8.9 - 10.3 mg/dL   GFR calc non Af Amer >60 >60 mL/min   GFR calc Af Amer >60 >60 mL/min    Comment: (NOTE) The eGFR has been calculated using the CKD EPI equation. This calculation has not been validated in all clinical situations. eGFR's persistently <60 mL/min signify possible Chronic Kidney Disease.    Anion gap 5 5 - 15    Lipid Panel No results for input(s): CHOL, TRIG, HDL, CHOLHDL, VLDL, LDLCALC in the last 72 hours.  Studies/Results:   Medications:  Scheduled Meds: . amantadine  100 mg Oral BID  . aspirin EC  325 mg Oral Daily  . carbidopa-levodopa  0.5 tablet Oral TID  . cefTRIAXone (ROCEPHIN)  IV  1 g Intravenous Q24H  . chlorhexidine  15 mL Mouth Rinse BID  . Chlorhexidine Gluconate Cloth  6 each Topical Q0600  . enoxaparin (LOVENOX) injection  30 mg Subcutaneous Q24H  . loratadine  10 mg Oral Daily  . mouth rinse  15 mL Mouth Rinse q12n4p  . mupirocin ointment  1 application Nasal BID  . white petrolatum   Topical Daily   Continuous Infusions:  PRN Meds:.acetaminophen **OR** acetaminophen, ondansetron **OR** ondansetron (ZOFRAN) IV, polyvinyl alcohol     LOS: 3 days   Catherine [name] A. Merlene [name], M.D.  Diplomate, Tax adviser of Psychiatry and Neurology ( Neurology).

## 2016-01-05 NOTE — Clinical Social Work Note (Signed)
CSW notified Crystal at [facility] that pt may d/c tomorrow.   Catherine [name], [place]

## 2016-01-06 MED ORDER — QUETIAPINE FUMARATE 25 MG PO TABS: 25.0000 mg | ORAL_TABLET | Freq: Two times a day (BID) | ORAL | Status: DC

## 2016-01-06 MED ORDER — CEPHALEXIN 500 MG PO CAPS: 500.0000 mg | ORAL_CAPSULE | Freq: Three times a day (TID) | ORAL | 0 refills | Status: DC

## 2016-01-06 MED ORDER — AMANTADINE HCL 100 MG PO CAPS: 100.0000 mg | ORAL_CAPSULE | Freq: Two times a day (BID) | ORAL | 0 refills | Status: DC

## 2016-01-06 NOTE — NC FL2 (Signed)
[place] LEVEL OF CARE SCREENING TOOL     IDENTIFICATION  Patient Name: Catherine [name] Birthdate: 1927-08-26 Sex: female Admission Date (Current Location): 01/01/2016  [facility] and [place] Number:  [employer] and Address:  [place] [address], [place]      Provider Number: [id number]  Attending Physician Name and Address:  Kathie Dike, MD  Relative Name and Phone Number:       Current Level of Care: Hospital Recommended Level of Care: [place] Prior Approval Number:    Date Approved/Denied:   PASRR Number:    Discharge Plan: Other (Comment) (ALF)    Current Diagnoses: Patient Active Problem List   Diagnosis Date Noted  . UTI (urinary tract infection) 01/04/2016  . Acute encephalopathy 01/02/2016  . NSTEMI (non-ST elevated myocardial infarction) ([place]) 01/02/2016  . HTN (hypertension) 01/02/2016  . Pressure ulcer 01/02/2016  . Altered mental status 01/01/2016  . Dementia in Parkinson's disease ([place]) 12/25/2015  . Parkinson's disease ([place]) 05/24/2015  . Parkinsonian tremor ([place]) 05/24/2015  . Left displaced femoral neck fracture ([place]) 05/23/2015  . Closed left hip fracture ([place])   . Hip fracture ([place]) 05/22/2015  . Weakness 12/22/2014  . Ataxia 12/22/2014  . PAT (paroxysmal atrial tachycardia) ([place]) 01/25/2013  . Palpitations 01/11/2013  . Paralysis agitans ([place]) 09/20/2012  . Type 2 diabetes mellitus ([place]) 07/21/2011  . Hyperlipidemia 07/21/2011    Orientation RESPIRATION BLADDER Height & Weight     Self, Place  Normal Incontinent Weight: 112 lb 7 oz (51 kg) Height:     BEHAVIORAL SYMPTOMS/MOOD NEUROLOGICAL BOWEL NUTRITION STATUS  Other (Comment) (n/a)  (n/a) Incontinent Diet (Low sodium heart healthy)  AMBULATORY STATUS COMMUNICATION OF NEEDS Skin   Total Care Verbally Other (Comment) (Abrasion to left thigh with foam dressing. Stage II to buttocks with foam dressing. )                        Personal Care Assistance Level of Assistance  Bathing, Feeding, Dressing Bathing Assistance: Maximum assistance Feeding assistance: Maximum assistance Dressing Assistance: Maximum assistance     Functional Limitations Info  Sight, Hearing, Speech Sight Info: Adequate Hearing Info: Impaired Speech Info: Adequate    SPECIAL CARE FACTORS FREQUENCY  PT (By licensed PT)     PT Frequency: Home health PT              Contractures      Additional Factors Info  Code Status, Allergies, Isolation Precautions Code Status Info: DNR Allergies Info: Sulfa Antibiotics, Azilect (Rasagiline), Fenofibrate, Sinemet (Carbidopa-Levodopa), Tape, Tomato     Isolation Precautions Info: 01/01/16 MRSA by PCR     Current Medications (01/06/2016):  This is the current hospital active medication list Current Facility-Administered Medications  Medication Dose Route Frequency Provider Last Rate Last Dose  . acetaminophen (TYLENOL) tablet 650 mg  650 mg Oral Q6H PRN Oswald Hillock, MD   650 mg at 01/03/16 1643   Or  . acetaminophen (TYLENOL) suppository 650 mg  650 mg Rectal Q6H PRN Oswald Hillock, MD      . amantadine (SYMMETREL) capsule 100 mg  100 mg Oral BID Phillips Odor, MD   100 mg at 01/06/16 1121  . aspirin EC tablet 325 mg  325 mg Oral Daily Kathie Dike, MD   325 mg at 01/06/16 1121  . carbidopa-levodopa (SINEMET IR) 25-100 MG per tablet immediate release 0.5 tablet  0.5 tablet  Oral TID Oswald Hillock, MD   0.5 tablet at 01/06/16 1121  . cefTRIAXone (ROCEPHIN) 1 g in dextrose 5 % 50 mL IVPB  1 g Intravenous Q24H Kathie Dike, MD   1 g at 01/05/16 1748  . chlorhexidine (PERIDEX) 0.12 % solution 15 mL  15 mL Mouth Rinse BID Kathie Dike, MD   15 mL at 01/06/16 1120  . Chlorhexidine Gluconate Cloth 2 % PADS 6 each  6 each Topical Q0600 Oswald Hillock, MD   6 each at 01/06/16 [phone]  . enoxaparin (LOVENOX) injection 30 mg  30 mg Subcutaneous Q24H Kathie Dike, MD   30 mg at  01/05/16 2343  . loratadine (CLARITIN) tablet 10 mg  10 mg Oral Daily Oswald Hillock, MD   10 mg at 01/06/16 1121  . MEDLINE mouth rinse  15 mL Mouth Rinse q12n4p Kathie Dike, MD   15 mL at 01/06/16 1200  . mupirocin ointment (BACTROBAN) 2 % 1 application  1 application Nasal BID Oswald Hillock, MD   1 application at [phone] [id number]  . ondansetron (ZOFRAN) tablet 4 mg  4 mg Oral Q6H PRN Oswald Hillock, MD       Or  . ondansetron (ZOFRAN) injection 4 mg  4 mg Intravenous Q6H PRN Oswald Hillock, MD      . polyvinyl alcohol (LIQUIFILM TEARS) 1.4 % ophthalmic solution 1 drop  1 drop Both Eyes TID PRN Oswald Hillock, MD      . white petrolatum (VASELINE) gel   Topical Daily Kathie Dike, MD         Discharge Medications: Medication List    STOP taking these medications   ciprofloxacin 250 MG tablet Commonly known as:  CIPRO  meloxicam 7.5 MG tablet Commonly known as:  MOBIC  naproxen sodium 220 MG tablet Commonly known as:  ANAPROX  ramipril 2.5 MG capsule Commonly known as:  ALTACE    TAKE these medications   amantadine 100 MG capsule Commonly known as:  SYMMETREL Take 1 capsule (100 mg total) by mouth 2 (two) times daily.  aspirin EC 81 MG tablet Take 81 mg by mouth daily.  carbidopa-levodopa 25-100 MG tablet Commonly known as:  SINEMET IR Take 0.5 tablets by mouth 3 (three) times daily.  carboxymethylcellulose 1 % ophthalmic solution 1 drop 3 (three) times daily.  cephALEXin 500 MG capsule Commonly known as:  KEFLEX Take 1 capsule (500 mg total) by mouth 3 (three) times daily.  docusate sodium 100 MG capsule Commonly known as:  COLACE Take 1 capsule (100 mg total) by mouth 2 (two) times daily.  DUODERM CGF DRESSING EX Apply 1 application topically every 3 (three) days. Applied to right buttock wound every 3 days  fexofenadine 180 MG tablet Commonly known as:  ALLEGRA Take 180 mg by mouth daily.  furosemide 20 MG tablet Commonly known as:  LASIX Take 20 mg by mouth daily.   LORazepam 0.5 MG tablet Commonly known as:  ATIVAN Take 0.5 mg by mouth 2 (two) times daily.  mineral oil liquid Place into both ears every Monday. Instill 2 drops in both ears once daily every Monday for Cerumen Impaction.  multivitamin capsule Take 1 capsule by mouth daily.  ondansetron 4 MG tablet Commonly known as:  ZOFRAN Take 4 mg by mouth every 6 (six) hours as needed for nausea or vomiting.  polyethylene glycol packet Commonly known as:  MIRALAX / GLYCOLAX Take 17 g by mouth daily as needed for mild constipation.  potassium chloride 10 MEQ tablet Commonly known as:  K-DUR Take 10 mEq by mouth daily.  QUEtiapine 25 MG tablet Commonly known as:  SEROQUEL Take 1 tablet (25 mg total) by mouth 2 (two) times daily. Take one tab daily and three tabs at bedtime What changed:  how much to take  senna 8.6 MG Tabs tablet Commonly known as:  SENOKOT Take 1 tablet by mouth at bedtime.  traZODone 50 MG tablet Commonly known as:  DESYREL Take 50 mg by mouth at bedtime.  VASELINE Gel Generic drug:  white petrolatum Apply 1 application topically daily. Applied to bottom, peri area, and thighs topically for rash       Relevant Imaging Results:  Relevant Lab Results:   Additional Information Home health PT/RN.   Benay Pike [place], [place]

## 2016-01-06 NOTE — Discharge Summary (Addendum)
Physician Discharge Summary  Catherine [name] [id number] DOB: Aug 03, 1927 DOA: 01/01/2016  PCP: Renata Caprice, DO  Admit date: 01/01/2016 Discharge date: 01/06/2016  Admitted From: ALF  Disposition:  ALF  Recommendations for Outpatient Follow-up:  1. Return to [facility] with HHPT and RN 2. Follow up final results of urine culture 3. Follow up with primary care physician in 1-2 weeks   Home Health: [facility] and PT Equipment/Devices: No  Discharge Condition: Stable  CODE STATUS: DNR Diet recommendation: heart healthy   Brief/Interim Summary: Pt presented with complaints of AMS. She was admitted for acute encephalopathy which was likely related to polypharmacy/UTI. On admission her Seroquel, ativan, and trazodone were held. Her CXR and CT head were unremarkable. Nuerology was consulted. On 8/26 her repeat urinalysis was indicative of infection and she was treated with rocephin. Her mental status has significantly improved and has returned to baseline. .   1. UTI. Patient had urinalysis that was indicative of UTI. Urine culture shows >100K colonies of unidentified organism. Since she has improved with rocephin, will transition to keflex. Blood cultures show no growth  2. Parkinson Disease. Continue sinemet and amantadine. She has a resting tremor in right hand.  3. NSTEMI. Troponin on admission was 1.7, trending down to 1.24. EKG showed sinus rhythm. Sitter reports that patient had been complaining of intermittent chest pain prior to admission. She does not complain of chest pain at this time. She does not appear to have had any previous cardiac work up. Echo shows normal EF with some wall motion abnormalities. Cardiology was consulted and recommended placing her on a low dose beat blocker and decreasing ASA to 81 mg. No further invasive work up planned. 4. HTN. Remains stable. Continue to monitor  Discharge Diagnoses:  Principal Problem:   NSTEMI (non-ST elevated myocardial infarction)  ([place]) Active Problems:   Parkinson's disease ([place])   Parkinsonian tremor ([place])   Altered mental status   Acute toxic encephalopathy   HTN (hypertension)   Pressure ulcer   UTI (urinary tract infection)   Stage 2 pressure ulcer on left forearm, present on admission  Discharge Instructions  Discharge Instructions    Diet - low sodium heart healthy    Complete by:  As directed   Face-to-face encounter (required for Medicare/Medicaid patients)    Complete by:  As directed   I MEMON,JEHANZEB certify that this patient is under my care and that I, or a nurse practitioner or physician's assistant working with me, had a face-to-face encounter that meets the physician face-to-face encounter requirements with this patient on 01/06/2016. The encounter with the patient was in whole, or in part for the following medical condition(s) which is the primary reason for home health care (List medical condition): urinary tract infection   The encounter with the patient was in whole, or in part, for the following medical condition, which is the primary reason for home health care:  urinary tract infection   I certify that, based on my findings, the following services are medically necessary home health services:   Nursing Physical therapy     Reason for Medically Necessary Home Health Services:  Skilled Nursing- Skilled Assessment/Observation   My clinical findings support the need for the above services:  Cognitive impairments, dementia, or mental confusion  that make it unsafe to leave home   Further, I certify that my clinical findings support that this patient is homebound due to:  Mental confusion   Home Health    Complete by:  As  directed   To provide the following care/treatments:   PT RN     Increase activity slowly    Complete by:  As directed       Medication List    STOP taking these medications   ciprofloxacin 250 MG tablet Commonly known as:  CIPRO   meloxicam 7.5 MG tablet Commonly  known as:  MOBIC   naproxen sodium 220 MG tablet Commonly known as:  ANAPROX   ramipril 2.5 MG capsule Commonly known as:  ALTACE     TAKE these medications   amantadine 100 MG capsule Commonly known as:  SYMMETREL Take 1 capsule (100 mg total) by mouth 2 (two) times daily.   aspirin EC 81 MG tablet Take 81 mg by mouth daily.   carbidopa-levodopa 25-100 MG tablet Commonly known as:  SINEMET IR Take 0.5 tablets by mouth 3 (three) times daily.   carboxymethylcellulose 1 % ophthalmic solution 1 drop 3 (three) times daily.   cephALEXin 500 MG capsule Commonly known as:  KEFLEX Take 1 capsule (500 mg total) by mouth 3 (three) times daily.   docusate sodium 100 MG capsule Commonly known as:  COLACE Take 1 capsule (100 mg total) by mouth 2 (two) times daily.   DUODERM CGF DRESSING EX Apply 1 application topically every 3 (three) days. Applied to right buttock wound every 3 days   fexofenadine 180 MG tablet Commonly known as:  ALLEGRA Take 180 mg by mouth daily.   furosemide 20 MG tablet Commonly known as:  LASIX Take 20 mg by mouth daily.   LORazepam 0.5 MG tablet Commonly known as:  ATIVAN Take 0.5 mg by mouth 2 (two) times daily.   mineral oil liquid Place into both ears every Monday. Instill 2 drops in both ears once daily every Monday for Cerumen Impaction.   multivitamin capsule Take 1 capsule by mouth daily.   ondansetron 4 MG tablet Commonly known as:  ZOFRAN Take 4 mg by mouth every 6 (six) hours as needed for nausea or vomiting.   polyethylene glycol packet Commonly known as:  MIRALAX / GLYCOLAX Take 17 g by mouth daily as needed for mild constipation.   potassium chloride 10 MEQ tablet Commonly known as:  K-DUR Take 10 mEq by mouth daily.   QUEtiapine 25 MG tablet Commonly known as:  SEROQUEL Take 1 tablet (25 mg total) by mouth 2 (two) times daily. Take one tab daily and three tabs at bedtime What changed:  how much to take   senna 8.6 MG Tabs  tablet Commonly known as:  SENOKOT Take 1 tablet by mouth at bedtime.   traZODone 50 MG tablet Commonly known as:  DESYREL Take 50 mg by mouth at bedtime.   VASELINE Gel Generic drug:  white petrolatum Apply 1 application topically daily. Applied to bottom, peri area, and thighs topically for rash       Allergies  Allergen Reactions  . Sulfa Antibiotics Rash  . Azilect [Rasagiline] Rash  . Fenofibrate Rash  . Sinemet [Carbidopa-Levodopa] Rash  . Tape Rash and Other (See Comments)    Redness  . Tomato Rash    Pt can eat raw tomatoes.Marland Kitchenate tomato sandwich a few days ago    Consultations:  Cardiology   Neurology    Procedures/Studies: Ct Head Wo Contrast  Result Date: 01/02/2016 CLINICAL DATA:  Altered mental status EXAM: CT HEAD WITHOUT CONTRAST TECHNIQUE: Contiguous axial images were obtained from the base of the skull through the vertex without intravenous contrast. COMPARISON:  08/08/2015 FINDINGS: Brain: There is no evidence for acute hemorrhage, hydrocephalus, mass lesion, or abnormal extra-axial fluid collection. No definite CT evidence for acute infarction. Diffuse loss of parenchymal volume is consistent with atrophy. Patchy low attenuation in the deep hemispheric and periventricular white matter is nonspecific, but likely reflects chronic microvascular ischemic demyelination. Vascular: Atherosclerotic calcification is visualized in the carotid arteries. No dense MCA sign. Major dural sinuses are unremarkable. Skull: No evidence for skull fracture. Sinuses/Orbits: The visualized paranasal sinuses and mastoid air cells are clear. Visualized portions of the globes and intraorbital fat are unremarkable. Other: Unremarkable IMPRESSION: Stable exam.  No acute intracranial abnormality. Atrophy with chronic small vessel white matter ischemic demyelination. Electronically Signed   By: Misty Azucena M.D.   On: 01/02/2016 20:31   Dg Chest Port 1 View  Result Date:  01/03/2016 CLINICAL DATA:  NG tube placement. EXAM: PORTABLE CHEST 1 VIEW COMPARISON:  Radiograph 01/01/2016 FINDINGS: Tip of the enteric tube below the diaphragm in the stomach, the side port is in the region of the gastroesophageal junction. Advancement of at least 4 cm would lead optimal positioning. Diffuse increase in left lung opacity compared to right may be due to rotation versus multifocal atelectasis. Heart size and mediastinal contours are normal. IMPRESSION: 1. Tip of the enteric tube below the diaphragm in the stomach, side-port in the region of the gastroesophageal junction. Advancement of at least 4 cm recommended for optimal placement. 2. Increased left lung opacities may be due to rotation versus multifocal atelectasis. Electronically Signed   By: Jeb Levering M.D.   On: 01/03/2016 02:21   Dg Chest Port 1 View  Result Date: 01/01/2016 CLINICAL DATA:  Altered mental status. Decreased level of consciousness. Increased lethargy. EXAM: PORTABLE CHEST 1 VIEW COMPARISON:  08/09/2015 FINDINGS: Patient is rotated to the right. Heart size and mediastinal contours are unchanged allowing for rotation. There is atherosclerosis of the thoracic aorta. Mild left basilar atelectasis. No confluent airspace disease. No pulmonary edema, pneumothorax or pleural effusion. Skin folds project over the right chest. IMPRESSION: Mild left basilar atelectasis. Thoracic aortic atherosclerosis. Electronically Signed   By: Jeb Levering M.D.   On: 01/01/2016 23:33   NG tube 8/25 >>8/27   Subjective: Sitting up in chair, awake, no complaints  Discharge Exam: Vitals:   01/05/16 2215 01/06/16 0523  BP: (!) 124/50 (!) 132/54  Pulse: 79 68  Resp: 20 20  Temp: 97.9 F (36.6 C) 98 F (36.7 C)   Vitals:   01/05/16 1109 01/05/16 1459 01/05/16 2215 01/06/16 0523  BP:  (!) 118/57 (!) 124/50 (!) 132/54  Pulse:  66 79 68  Resp:  18 20 20   Temp:  97.6 F (36.4 C) 97.9 F (36.6 C) 98 F (36.7 C)  TempSrc:   Oral Oral Oral  SpO2: 98% 96% 96% 97%  Weight:        General: Pt is alert, awake, not in acute distress Cardiovascular: RRR, S1/S2 +, no rubs, no gallops Respiratory: CTA bilaterally, no wheezing, no rhonchi Abdominal: Soft, NT, ND, bowel sounds + Extremities: no edema, no cyanosis    The results of significant diagnostics from this hospitalization (including imaging, microbiology, ancillary and laboratory) are listed below for reference.     Microbiology: Recent Results (from the past 240 hour(s))  MRSA PCR Screening     Status: Abnormal   Collection Time: 01/01/16  9:45 PM  Result Value Ref Range Status   MRSA by PCR POSITIVE (A) NEGATIVE Final  Comment:        The GeneXpert MRSA Assay (FDA approved for NASAL specimens only), is one component of a comprehensive MRSA colonization surveillance program. It is not intended to diagnose MRSA infection nor to guide or monitor treatment for MRSA infections. RESULT CALLED TO, READ BACK BY AND VERIFIED WITH: HAMILTON S AT 0027 ON [id number] BY FORSYTH K   Culture, blood (routine x 2)     Status: None (Preliminary result)   Collection Time: 01/03/16  1:32 PM  Result Value Ref Range Status   Specimen Description BLOOD LEFT HAND  Final   Special Requests BOTTLES DRAWN AEROBIC AND ANAEROBIC 8CC  Final   Culture NO GROWTH 3 DAYS  Final   Report Status PENDING  Incomplete  Culture, blood (routine x 2)     Status: None (Preliminary result)   Collection Time: 01/03/16  1:44 PM  Result Value Ref Range Status   Specimen Description BLOOD LEFT ANTECUBITAL  Final   Special Requests BOTTLES DRAWN AEROBIC AND ANAEROBIC 8CC  Final   Culture NO GROWTH 3 DAYS  Final   Report Status PENDING  Incomplete  Culture, Urine     Status: Abnormal (Preliminary result)   Collection Time: 01/03/16  1:44 PM  Result Value Ref Range Status   Specimen Description URINE, CLEAN CATCH  Final   Special Requests NONE  Final   Culture (A)  Final    >=100,000  COLONIES/mL UNIDENTIFIED ORGANISM Performed at [facility]    Report Status PENDING  Incomplete     Labs: BNP (last 3 results) No results for input(s): BNP in the last 8760 hours. Basic Metabolic Panel:  Recent Labs Lab 01/01/16 1800 01/02/16 0336 01/03/16 0614 01/04/16 0555 01/05/16 0614  NA 138 141 145 142 140  K 3.9 4.4 4.0 3.7 3.8  CL 106 105 112* 114* 111  CO2 28 23 20* 24 24  GLUCOSE 127* 128* 124* 133* 126*  BUN 18 21* 29* 23* 12  CREATININE 0.96 1.16* 1.16* 0.86 0.66  CALCIUM 9.2 9.6 8.7* 8.3* 8.5*   Liver Function Tests:  Recent Labs Lab 01/02/16 0336  AST 30  ALT 6*  ALKPHOS 49  BILITOT 0.9  PROT 6.7  ALBUMIN 3.9   No results for input(s): LIPASE, AMYLASE in the last 168 hours.  Recent Labs Lab 01/02/16 1809  AMMONIA 22   CBC:  Recent Labs Lab 01/01/16 1800 01/02/16 0336 01/03/16 0614 01/04/16 0555 01/05/16 0614  WBC 7.6 11.7* 7.5 8.2 6.9  NEUTROABS 4.8  --   --   --   --   HGB 13.7 13.2 12.0 11.5* 12.4  HCT 40.7 39.1 35.9* 34.6* 36.7  MCV 95.3 94.7 96.0 95.3 95.8  PLT 204 220 190 184 189   Cardiac Enzymes:  Recent Labs Lab 01/01/16 1800 01/01/16 2143 01/02/16 0336 01/02/16 0952  TROPONINI 1.74* 1.32* 1.32* 1.24*   BNP: Invalid input(s): POCBNP CBG:  Recent Labs Lab 01/02/16 0803 01/02/16 1217 01/02/16 1611 01/02/16 2020 01/03/16 0720  GLUCAP 108* 128* 126* 116* 126*   D-Dimer No results for input(s): DDIMER in the last 72 hours. Hgb A1c No results for input(s): HGBA1C in the last 72 hours. Lipid Profile No results for input(s): CHOL, HDL, LDLCALC, TRIG, CHOLHDL, LDLDIRECT in the last 72 hours. Thyroid function studies No results for input(s): TSH, T4TOTAL, T3FREE, THYROIDAB in the last 72 hours.  Invalid input(s): FREET3 Anemia work up No results for input(s): VITAMINB12, FOLATE, FERRITIN, TIBC, IRON, RETICCTPCT in the last  72 hours. Urinalysis    Component Value Date/Time   COLORURINE YELLOW  01/03/2016 [place] 01/03/2016 1458   LABSPEC 1.025 01/03/2016 1458   PHURINE 6.0 01/03/2016 1458   GLUCOSEU NEGATIVE 01/03/2016 1458   HGBUR MODERATE (A) 01/03/2016 1458   BILIRUBINUR NEGATIVE 01/03/2016 1458   KETONESUR 15 (A) 01/03/2016 1458   PROTEINUR 30 (A) 01/03/2016 1458   UROBILINOGEN 0.2 12/21/2014 2315   NITRITE NEGATIVE 01/03/2016 1458   LEUKOCYTESUR LARGE (A) 01/03/2016 1458   Sepsis Labs Invalid input(s): PROCALCITONIN,  WBC,  LACTICIDVEN Microbiology Recent Results (from the past 240 hour(s))  MRSA PCR Screening     Status: Abnormal   Collection Time: 01/01/16  9:45 PM  Result Value Ref Range Status   MRSA by PCR POSITIVE (A) NEGATIVE Final    Comment:        The GeneXpert MRSA Assay (FDA approved for NASAL specimens only), is one component of a comprehensive MRSA colonization surveillance program. It is not intended to diagnose MRSA infection nor to guide or monitor treatment for MRSA infections. RESULT CALLED TO, READ BACK BY AND VERIFIED WITH: HAMILTON S AT 0027 ON [id number] BY FORSYTH K   Culture, blood (routine x 2)     Status: None (Preliminary result)   Collection Time: 01/03/16  1:32 PM  Result Value Ref Range Status   Specimen Description BLOOD LEFT HAND  Final   Special Requests BOTTLES DRAWN AEROBIC AND ANAEROBIC 8CC  Final   Culture NO GROWTH 3 DAYS  Final   Report Status PENDING  Incomplete  Culture, blood (routine x 2)     Status: None (Preliminary result)   Collection Time: 01/03/16  1:44 PM  Result Value Ref Range Status   Specimen Description BLOOD LEFT ANTECUBITAL  Final   Special Requests BOTTLES DRAWN AEROBIC AND ANAEROBIC 8CC  Final   Culture NO GROWTH 3 DAYS  Final   Report Status PENDING  Incomplete  Culture, Urine     Status: Abnormal (Preliminary result)   Collection Time: 01/03/16  1:44 PM  Result Value Ref Range Status   Specimen Description URINE, CLEAN CATCH  Final   Special Requests NONE  Final   Culture  (A)  Final    >=100,000 COLONIES/mL UNIDENTIFIED ORGANISM Performed at [facility]    Report Status PENDING  Incomplete     Time coordinating discharge: Over 30 minutes  SIGNED:   Kathie Dike, MD  [facility] 01/06/2016, 11:28 AM If 7PM-7AM, please contact night-coverage [email or web address] Password [email or web address]

## 2016-01-06 NOTE — Evaluation (Signed)
Physical Therapy Evaluation Patient Details Name: Catherine [name] MRN: [id number] DOB: 04/18/1928 Today's Date: 01/06/2016   History of Present Illness  Pt is a resident as [facility], [facility] living. Pt was dx with a UTI 12/22/15 and has completed a course of antibiotics. While in the ED she was noted to have an elevated troponin of 1.74. CXR revealed mild left basilar atelectasis. EKG showed T wave flattening in lateral leads. She was admitted for further evaluation of altered mental status.  Neurology was consulted and recommended NG tube placement to administer medications. CT head showed no abnormalities.  Dx: NSTEMI. PMH: arthritis, basal cell CA removal  from the forehead, dementia in Parkinson's disease, DM, HTN, Obese, paralysis agitans, Parkinson Disease, Sciatica of the L side, tremor, appendectomy, L knee arthroscopic surgery, L hip pinning, trigger finger surgery B thumb.   Clinical Impression  Pt received in bed, sitter from [place] present, and pt is agreeable to PT evaluation.  Pt normally requires assistance for transfers, as well as ADL's.  Today, pt was able to perform stand pivot transfer from the bed<>w/c, however she required Max A, which both sitter and pt agree is not her baseline - she usually does not require that much assistance.  At this point, she is recommended to d/c back to [facility] with HHPT to improve strength with transfers.     Follow Up Recommendations Home health PT    Equipment Recommendations  None recommended by PT    Recommendations for Other Services       Precautions / Restrictions Precautions Precautions: Fall Precaution Comments: Due to immobility and PMH of Parkinson's.  Restrictions Weight Bearing Restrictions: No      Mobility  Bed Mobility Overal bed mobility: Needs Assistance Bed Mobility: Supine to Sit     Supine to sit: Total assist     General bed mobility comments: Pt normally sleeps in a lift chair.    Transfers Overall  transfer level: Needs assistance Equipment used: Rolling walker (2 wheeled) Transfers: Stand Pivot Transfers   Stand pivot transfers: Max assist       General transfer comment: Pt unable to demonstrate power up for standing, and demonstrates poor upright standing posture.  Very short festingating steps to transfer bed<>chair.  Sitter and pt both state that she normally transferrs better than that with less assistances.   Ambulation/Gait                Stairs            Wheelchair Mobility    Modified Rankin (Stroke Patients Only)       Balance Overall balance assessment: Needs assistance Sitting-balance support: Bilateral upper extremity supported       Standing balance support: Bilateral upper extremity supported Standing balance-Leahy Scale: Poor                               Pertinent Vitals/Pain Pain Assessment: No/denies pain    Home Living   Living Arrangements:  Nanine Means) Available Help at Discharge: Personal care attendant Type of Home: Assisted living         Home Equipment: Gilford Rile - 2 wheels;Wheelchair - manual Additional Comments: Pt sleeps in a lift chair, and uses it to come sit<>stand.     Prior Function Level of Independence: Needs assistance   Gait / Transfers Assistance Needed: Pt requires assistance for transfers - sometimes uses RW, and sometimes performs SPT with w/c in front  of her.   ADL's / Homemaking Assistance Needed: Assistance for dressing and bathing.         Hand Dominance   Dominant Hand: Right    Extremity/Trunk Assessment   Upper Extremity Assessment: Generalized weakness           Lower Extremity Assessment: Generalized weakness (B knee flexion contractures of ~20* - pt mobilizes in a w/c. )         Communication   Communication: No difficulties  Cognition Arousal/Alertness: Awake/alert Behavior During Therapy: WFL for tasks assessed/performed Overall Cognitive Status: Within  Functional Limits for tasks assessed                      General Comments      Exercises        Assessment/Plan    PT Assessment Patient needs continued PT services  PT Diagnosis Generalized weakness   PT Problem List Decreased strength;Decreased range of motion;Decreased activity tolerance;Decreased balance;Decreased mobility;Decreased knowledge of precautions  PT Treatment Interventions DME instruction;Functional mobility training;Therapeutic activities;Therapeutic exercise;Balance training;Patient/family education   PT Goals (Current goals can be found in the Care Plan section) Acute Rehab PT Goals Patient Stated Goal: Pt wishes to go back to [place].  PT Goal Formulation: With patient Time For Goal Achievement: 01/13/16 Potential to Achieve Goals: Fair    Frequency Min 3X/week   Barriers to discharge        Co-evaluation               End of Session Equipment Utilized During Treatment: Gait belt Activity Tolerance: Patient tolerated treatment well Patient left: in chair;with call bell/phone within reach;with nursing/sitter in room Nurse Communication: Mobility status ([place] lift for sit<>stand transfer BTB.  Pt can't extend hips enough to use the STEDY.  )    Functional Assessment Tool Used: [employer]	[place] AM-PAC "6-clicks"  Functional Limitation: Mobility: Walking and moving around Mobility: Walking and Moving Around Current Status [phone]): At least 80 percent but less than 100 percent impaired, limited or restricted Mobility: Walking and Moving Around Goal Status [phone]): At least 60 percent but less than 80 percent impaired, limited or restricted    Time: 0905-0939 PT Time Calculation (min) (ACUTE ONLY): 34 min   Charges:   PT Evaluation $PT Eval Low Complexity: 1 Procedure PT Treatments $Therapeutic Activity: 8-22 mins   PT G Codes:   PT G-Codes **NOT FOR INPATIENT CLASS** Functional Assessment Tool Used: [employer]  "6-clicks"  Functional Limitation: Mobility: Walking and moving around Mobility: Walking and Moving Around Current Status [phone]): At least 80 percent but less than 100 percent impaired, limited or restricted Mobility: Walking and Moving Around Goal Status [phone]): At least 60 percent but less than 80 percent impaired, limited or restricted    Beth Daylen Lipsky, PT, DPT X: [phone]

## 2016-01-06 NOTE — Progress Notes (Signed)
Patient ID: Catherine [name], female   DOB: 1928-03-29, 82 y.o.   MRN: [id number]    Subjective:  Denies SSCP, palpitations or Dyspnea Daughter with her needs PT to get OOB  Objective:  Vitals:   01/05/16 1109 01/05/16 1459 01/05/16 2215 01/06/16 0523  BP:  (!) 118/57 (!) 124/50 (!) 132/54  Pulse:  66 79 68  Resp:  18 20 20   Temp:  97.6 F (36.4 C) 97.9 F (36.6 C) 98 F (36.7 C)  TempSrc:  Oral Oral Oral  SpO2: 98% 96% 96% 97%  Weight:        Intake/Output from previous day:  Intake/Output Summary (Last 24 hours) at 01/06/16 [id number] Last data filed at 01/06/16 [id number]  Gross per 24 hour  Intake              720 ml  Output                0 ml  Net              720 ml    Physical Exam: Affect appropriate Frail elderly female  HEENT: normal Neck supple with no adenopathy JVP normal no bruits no thyromegaly Lungs clear with no wheezing and good diaphragmatic motion Heart:  S1/S2 no murmur, no rub, gallop or click PMI normal Abdomen: benighn, BS positve, no tenderness, no AAA no bruit.  No HSM or HJR Distal pulses intact with no bruits No edema Neuro non-focal Skin warm and dry Parkinsons/Dementia limited mobility   Lab Results: Basic Metabolic Panel:  Recent Labs  01/04/16 0555 01/05/16 0614  NA 142 140  K 3.7 3.8  CL 114* 111  CO2 24 24  GLUCOSE 133* 126*  BUN 23* 12  CREATININE 0.86 0.66  CALCIUM 8.3* 8.5*   CBC:  Recent Labs  01/04/16 0555 01/05/16 0614  WBC 8.2 6.9  HGB 11.5* 12.4  HCT 34.6* 36.7  MCV 95.3 95.8  PLT 184 189    Imaging: No results found.  Cardiac Studies:  ECG: NSR no acute ST changes    Telemetry:  NSR 01/06/2016   Echo: 8/27  EF 60-65%   Medications:   . amantadine  100 mg Oral BID  . aspirin EC  325 mg Oral Daily  . carbidopa-levodopa  0.5 tablet Oral TID  . cefTRIAXone (ROCEPHIN)  IV  1 g Intravenous Q24H  . chlorhexidine  15 mL Mouth Rinse BID  . Chlorhexidine Gluconate Cloth  6 each Topical Q0600  .  enoxaparin (LOVENOX) injection  30 mg Subcutaneous Q24H  . loratadine  10 mg Oral Daily  . mouth rinse  15 mL Mouth Rinse q12n4p  . mupirocin ointment  1 application Nasal BID  . white petrolatum   Topical Daily       Assessment/Plan:   Elevated Troponin:  No chest pain or acute ECG changes EF normal by echo Given age and Parkinson's With dementia no further w/u indicated    Parkinons/Dementia:  Needs PT/OT barely ambulatory at baseline to return to [place]  Continue amantadine and carbidopa  UTI:  On rocephin ? Simplify   Jenkins Rouge 01/06/2016, 8:14 AM

## 2016-01-06 NOTE — Progress Notes (Signed)
Discharged backed to [facility] family [facility]. Discharge packet given to caretaker. Accompanied by staff to an awaiting vehicle.

## 2016-01-06 NOTE — Clinical Social Work Note (Signed)
Pt d/c today back to [facility]. Pt's HCPOA and facility aware and agreeable. Facility to provide transport. Aware of need for in house home health PT/RN.   Benay Pike, [place]

## 2016-01-07 LAB — URINE CULTURE

## 2016-01-08 LAB — CULTURE, BLOOD (ROUTINE X 2)
CULTURE: NO GROWTH
Culture: NO GROWTH

## 2016-01-17 NOTE — ED Provider Notes (Signed)
[facility] DEPT Provider Note   CSN: [id number] Arrival date & time: 01/01/16  1704     History   Chief Complaint Chief Complaint  Patient presents with  . Altered Mental Status    HPI Catherine [name] is a 82 y.o. female.  HPI   Catherine [name]  is a 82 y.o. female, Who is a resident of [facility] was properly the hospital for decreased level of consciousness for past few days. Patient was diagnosed with UTI on 12/22/2015 and started on ciprofloxacin, in the meantime her dose of Seroquel also was changed from 50 mg daily to 75 mg daily by her neurologist, since then patient has become more lethargic, less talkative, also had poor by mouth intake. There is no history of recent nausea or vomiting. No chest pain no shortness of breath no cough and phlegm.   Past Medical History:  Diagnosis Date  . Arthritis   . Cancer ([place])    basal cell-forehead  . Dementia in Parkinson's disease ([place]) 12/25/2015  . Diabetes mellitus without complication ([place])   . Dyslipidemia   . Hypertension   . Migraine without aura, without mention of intractable migraine without mention of status migrainosus   . Obese   . Paralysis agitans ([place]) 09/20/2012  . Parkinson's disease ([place])   . Sciatica of left side   . Tremor     Patient Active Problem List   Diagnosis Date Noted  . UTI (urinary tract infection) 01/04/2016  . Acute encephalopathy 01/02/2016  . NSTEMI (non-ST elevated myocardial infarction) ([place]) 01/02/2016  . HTN (hypertension) 01/02/2016  . Pressure ulcer 01/02/2016  . Altered mental status 01/01/2016  . Dementia in Parkinson's disease ([place]) 12/25/2015  . Parkinson's disease ([place]) 05/24/2015  . 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 Left displaced femoral neck fracture ([place]) 05/23/2015  . Closed left hip fracture ([place])   . Hip fracture ([place]) 05/22/2015  . Weakness 12/22/2014  . Ataxia 12/22/2014  . PAT (paroxysmal atrial tachycardia) ([place]) 01/25/2013  . Palpitations  01/11/2013  . Paralysis agitans ([place]) 09/20/2012  . Type 2 diabetes mellitus ([place]) 07/21/2011  . Hyperlipidemia 07/21/2011    Past Surgical History:  Procedure Laterality Date  . APPENDECTOMY    . Arthroscopic surgery     Left knee  . basal cell carcinoma resection     Forehead  . CATARACT EXTRACTION W/PHACO Right 11/19/2013   Procedure: CATARACT EXTRACTION PHACO AND INTRAOCULAR LENS PLACEMENT (IOC);  Surgeon: Tonny Branch, MD;  Location: [facility];  Service: Ophthalmology;  Laterality: Right;  CDE:  12.95  . CATARACT EXTRACTION W/PHACO Left 12/17/2013   Procedure: CATARACT EXTRACTION PHACO AND INTRAOCULAR LENS PLACEMENT (IOC);  Surgeon: Tonny Branch, MD;  Location: [facility];  Service: Ophthalmology;  Laterality: Left;  CDE 12.10  . HIP PINNING,CANNULATED Left 05/23/2015   Procedure: INTERNAL FIXATION LEFT HIP;  Surgeon: Carole Civil, MD;  Location: [facility];  Service: Orthopedics;  Laterality: Left;  . MOUTH SURGERY    . TONSILLECTOMY    . trigger finger surgery     Bilateral thumb    OB History    No data available       Home Medications    Prior to Admission medications   Medication Sig Start Date End Date Taking? Authorizing Provider  aspirin EC 81 MG tablet Take 81 mg by mouth daily.   Yes Historical Provider, MD  carbidopa-levodopa (SINEMET IR) 25-100 MG tablet Take 0.5 tablets by mouth 3 (three) times daily. 10/20/15  Yes  Ward Givens, NP  carboxymethylcellulose 1 % ophthalmic solution 1 drop 3 (three) times daily.   Yes Historical Provider, MD  Control Gel Formula Dressing (DUODERM CGF DRESSING EX) Apply 1 application topically every 3 (three) days. Applied to right buttock wound every 3 days   Yes Historical Provider, MD  docusate sodium (COLACE) 100 MG capsule Take 1 capsule (100 mg total) by mouth 2 (two) times daily. 05/25/15  Yes Annita Brod, MD  fexofenadine (ALLEGRA) 180 MG tablet Take 180 mg by mouth daily.   Yes Historical Provider, MD  furosemide (LASIX) 20 MG  tablet Take 20 mg by mouth daily.    Yes Historical Provider, MD  LORazepam (ATIVAN) 0.5 MG tablet Take 0.5 mg by mouth 2 (two) times daily.   Yes Historical Provider, MD  mineral oil liquid Place into both ears every Monday. Instill 2 drops in both ears once daily every Monday for Cerumen Impaction.   Yes Historical Provider, MD  Multiple Vitamin (MULTIVITAMIN) capsule Take 1 capsule by mouth daily.   Yes Historical Provider, MD  ondansetron (ZOFRAN) 4 MG tablet Take 4 mg by mouth every 6 (six) hours as needed for nausea or vomiting.    Yes Historical Provider, MD  polyethylene glycol (MIRALAX / GLYCOLAX) packet Take 17 g by mouth daily as needed for mild constipation.   Yes Historical Provider, MD  potassium chloride (K-DUR) 10 MEQ tablet Take 10 mEq by mouth daily.   Yes Historical Provider, MD  senna (SENOKOT) 8.6 MG TABS tablet Take 1 tablet by mouth at bedtime.   Yes Historical Provider, MD  traZODone (DESYREL) 50 MG tablet Take 50 mg by mouth at bedtime.   Yes Historical Provider, MD  white petrolatum (VASELINE) GEL Apply 1 application topically daily. Applied to bottom, peri area, and thighs topically for rash   Yes Historical Provider, MD  amantadine (SYMMETREL) 100 MG capsule Take 1 capsule (100 mg total) by mouth 2 (two) times daily. 01/06/16   Kathie Dike, MD  cephALEXin (KEFLEX) 500 MG capsule Take 1 capsule (500 mg total) by mouth 3 (three) times daily. 01/06/16   Kathie Dike, MD  QUEtiapine (SEROQUEL) 25 MG tablet Take 1 tablet (25 mg total) by mouth 2 (two) times daily. Take one tab daily and three tabs at bedtime 01/06/16   Kathie Dike, MD    Family History Family History  Problem Relation Age of Onset  . Rheum arthritis Mother   . Heart attack Father     not certain of COD  . Hypertension Paternal Grandfather   . Stroke Paternal Grandfather   . Cirrhosis Paternal Grandmother     Social History Social History  Substance Use Topics  . Smoking status: Never Smoker    . Smokeless tobacco: Never Used  . Alcohol use No     Allergies   Sulfa antibiotics; Azilect [rasagiline]; Fenofibrate; Sinemet [carbidopa-levodopa]; Tape; and Tomato   Review of Systems Review of Systems  All systems reviewed and negative, other than as noted in HPI.   Physical Exam Updated Vital Signs BP 127/63 (BP Location: Right Arm)   Pulse 93   Temp 98 F (36.7 C) (Oral)   Resp 20   Wt 112 lb 7 oz (51 kg)   SpO2 99%   BMI 21.96 kg/m   Physical Exam  Constitutional: She appears well-developed and well-nourished. No distress.  Laying in bed. No acute distress. Pleasantly confused. Follows commands. No focal motor deficit.  HENT:  Head: Normocephalic and atraumatic.  Eyes: Conjunctivae are normal. Right eye exhibits no discharge. Left eye exhibits no discharge.  Neck: Neck supple.  Cardiovascular: Normal rate, regular rhythm and normal heart sounds.  Exam reveals no gallop and no friction rub.   No murmur heard. Pulmonary/Chest: Effort normal and breath sounds normal. No respiratory distress.  Abdominal: Soft. She exhibits no distension. There is no tenderness.  Musculoskeletal: She exhibits no edema or tenderness.  Neurological: She is alert.  Skin: Skin is warm and dry.  Psychiatric: She has a normal mood and affect. Her behavior is normal. Thought content normal.  Nursing note and vitals reviewed.    ED Treatments / Results  Labs (all labs ordered are listed, but only abnormal results are displayed) Labs Reviewed  MRSA PCR SCREENING - Abnormal; Notable for the following:       Result Value   MRSA by PCR POSITIVE (*)    All other components within normal limits  URINE CULTURE - Abnormal; Notable for the following:    Culture >=100,000 COLONIES/mL ENTEROCOCCUS FAECALIS (*)    Organism ID, Bacteria ENTEROCOCCUS FAECALIS (*)    All other components within normal limits  BASIC METABOLIC PANEL - Abnormal; Notable for the following:    Glucose, Bld 127 (*)     GFR calc non Af Amer 52 (*)    Anion gap 4 (*)    All other components within normal limits  TROPONIN I - Abnormal; Notable for the following:    Troponin I 1.74 (*)    All other components within normal limits  GLUCOSE, CAPILLARY - Abnormal; Notable for the following:    Glucose-Capillary 109 (*)    All other components within normal limits  TROPONIN I - Abnormal; Notable for the following:    Troponin I 1.32 (*)    All other components within normal limits  TROPONIN I - Abnormal; Notable for the following:    Troponin I 1.32 (*)    All other components within normal limits  TROPONIN I - Abnormal; Notable for the following:    Troponin I 1.24 (*)    All other components within normal limits  CBC - Abnormal; Notable for the following:    WBC 11.7 (*)    All other components within normal limits  COMPREHENSIVE METABOLIC PANEL - Abnormal; Notable for the following:    Glucose, Bld 128 (*)    BUN 21 (*)    Creatinine, Ser 1.16 (*)    ALT 6 (*)    GFR calc non Af Amer 42 (*)    GFR calc Af Amer 48 (*)    All other components within normal limits  GLUCOSE, CAPILLARY - Abnormal; Notable for the following:    Glucose-Capillary 140 (*)    All other components within normal limits  GLUCOSE, CAPILLARY - Abnormal; Notable for the following:    Glucose-Capillary 108 (*)    All other components within normal limits  GLUCOSE, CAPILLARY - Abnormal; Notable for the following:    Glucose-Capillary 128 (*)    All other components within normal limits  GLUCOSE, CAPILLARY - Abnormal; Notable for the following:    Glucose-Capillary 126 (*)    All other components within normal limits  BASIC METABOLIC PANEL - Abnormal; Notable for the following:    Chloride 112 (*)    CO2 20 (*)    Glucose, Bld 124 (*)    BUN 29 (*)    Creatinine, Ser 1.16 (*)    Calcium 8.7 (*)    GFR  calc non Af Amer 42 (*)    GFR calc Af Amer 48 (*)    All other components within normal limits  CBC - Abnormal;  Notable for the following:    RBC 3.74 (*)    HCT 35.9 (*)    All other components within normal limits  BLOOD GAS, ARTERIAL - Abnormal; Notable for the following:    pCO2 arterial 33.0 (*)    All other components within normal limits  GLUCOSE, CAPILLARY - Abnormal; Notable for the following:    Glucose-Capillary 116 (*)    All other components within normal limits  GLUCOSE, CAPILLARY - Abnormal; Notable for the following:    Glucose-Capillary 126 (*)    All other components within normal limits  URINALYSIS, ROUTINE W REFLEX MICROSCOPIC (NOT AT [facility]) - Abnormal; Notable for the following:    Hgb urine dipstick MODERATE (*)    Ketones, ur 15 (*)    Protein, ur 30 (*)    Leukocytes, UA LARGE (*)    All other components within normal limits  URINE MICROSCOPIC-ADD ON - Abnormal; Notable for the following:    Squamous Epithelial / LPF TOO NUMEROUS TO COUNT (*)    Bacteria, UA MANY (*)    All other components within normal limits  BASIC METABOLIC PANEL - Abnormal; Notable for the following:    Chloride 114 (*)    Glucose, Bld 133 (*)    BUN 23 (*)    Calcium 8.3 (*)    GFR calc non Af Amer 60 (*)    Anion gap 4 (*)    All other components within normal limits  CBC - Abnormal; Notable for the following:    RBC 3.63 (*)    Hemoglobin 11.5 (*)    HCT 34.6 (*)    All other components within normal limits  CBC - Abnormal; Notable for the following:    RBC 3.83 (*)    All other components within normal limits  BASIC METABOLIC PANEL - Abnormal; Notable for the following:    Glucose, Bld 126 (*)    Calcium 8.5 (*)    All other components within normal limits  CULTURE, BLOOD (ROUTINE X 2)  CULTURE, BLOOD (ROUTINE X 2)  URINALYSIS, ROUTINE W REFLEX MICROSCOPIC (NOT AT [facility])  CBC WITH DIFFERENTIAL/PLATELET  TSH  AMMONIA    EKG  EKG Interpretation  Date/Time:  Thursday January 01 2016 19:11:34 EDT Ventricular Rate:  82 PR Interval:    QRS Duration: 73 QT Interval:  396 QTC  Calculation: 463 R Axis:   10 Text Interpretation:  Sinus rhythm Right atrial enlargement Low voltage, precordial leads Abnormal R-wave progression, early transition Nonspecific T abnormalities, lateral leads Confirmed by Wilson Singer  MD, Karole Oo ([id number]) on 01/01/2016 7:43:16 PM       Radiology No results found.  Ct Head Wo Contrast  Result Date: 01/02/2016 CLINICAL DATA:  Altered mental status EXAM: CT HEAD WITHOUT CONTRAST TECHNIQUE: Contiguous axial images were obtained from the base of the skull through the vertex without intravenous contrast. COMPARISON:  08/08/2015 FINDINGS: Brain: There is no evidence for acute hemorrhage, hydrocephalus, mass lesion, or abnormal extra-axial fluid collection. No definite CT evidence for acute infarction. Diffuse loss of parenchymal volume is consistent with atrophy. Patchy low attenuation in the deep hemispheric and periventricular white matter is nonspecific, but likely reflects chronic microvascular ischemic demyelination. Vascular: Atherosclerotic calcification is visualized in the carotid arteries. No dense MCA sign. Major dural sinuses are unremarkable. Skull: No evidence for skull  fracture. Sinuses/Orbits: The visualized paranasal sinuses and mastoid air cells are clear. Visualized portions of the globes and intraorbital fat are unremarkable. Other: Unremarkable IMPRESSION: Stable exam.  No acute intracranial abnormality. Atrophy with chronic small vessel white matter ischemic demyelination. Electronically Signed   By: Misty Lager M.D.   On: 01/02/2016 20:31   Dg Chest Port 1 View  Result Date: 01/03/2016 CLINICAL DATA:  NG tube placement. EXAM: PORTABLE CHEST 1 VIEW COMPARISON:  Radiograph 01/01/2016 FINDINGS: Tip of the enteric tube below the diaphragm in the stomach, the side port is in the region of the gastroesophageal junction. Advancement of at least 4 cm would lead optimal positioning. Diffuse increase in left lung opacity compared to right may be due  to rotation versus multifocal atelectasis. Heart size and mediastinal contours are normal. IMPRESSION: 1. Tip of the enteric tube below the diaphragm in the stomach, side-port in the region of the gastroesophageal junction. Advancement of at least 4 cm recommended for optimal placement. 2. Increased left lung opacities may be due to rotation versus multifocal atelectasis. Electronically Signed   By: Jeb Levering M.D.   On: 01/03/2016 02:21   Dg Chest Port 1 View  Result Date: 01/01/2016 CLINICAL DATA:  Altered mental status. Decreased level of consciousness. Increased lethargy. EXAM: PORTABLE CHEST 1 VIEW COMPARISON:  08/09/2015 FINDINGS: Patient is rotated to the right. Heart size and mediastinal contours are unchanged allowing for rotation. There is atherosclerosis of the thoracic aorta. Mild left basilar atelectasis. No confluent airspace disease. No pulmonary edema, pneumothorax or pleural effusion. Skin folds project over the right chest. IMPRESSION: Mild left basilar atelectasis. Thoracic aortic atherosclerosis. Electronically Signed   By: Jeb Levering M.D.   On: 01/01/2016 23:33    Procedures Procedures (including critical care time)  Medications Ordered in ED Medications  magnesium citrate solution 1 Bottle (1 Bottle Oral Given 01/05/16 1747)     Initial Impression / Assessment and Plan / ED Course  I have reviewed the triage vital signs and the nursing notes.  Pertinent labs & imaging results that were available during my care of the patient were reviewed by me and considered in my medical decision making (see chart for details).  Clinical Course    I personally preformed the services scribed in my presence. The recorded information has been reviewed is accurate. Virgel Manifold, MD.   Final Clinical Impressions(s) / ED Diagnoses   Final diagnoses:  Altered mental state  Elevated Troponin  New Prescriptions Discharge Medication List as of 01/06/2016  4:51 PM    START  taking these medications   Details  amantadine (SYMMETREL) 100 MG capsule Take 1 capsule (100 mg total) by mouth 2 (two) times daily., Starting Tue 01/06/2016, Print    cephALEXin (KEFLEX) 500 MG capsule Take 1 capsule (500 mg total) by mouth 3 (three) times daily., Starting Tue 01/06/2016, Print         Virgel Manifold, MD 01/17/16 1705

## 2016-04-04 ENCOUNTER — Emergency Department (HOSPITAL_COMMUNITY): Payer: Medicare Other

## 2016-04-04 ENCOUNTER — Observation Stay (HOSPITAL_COMMUNITY)
Admission: EM | Admit: 2016-04-04 | Discharge: 2016-04-05 | Disposition: A | Discharge status: Home / Self Care | Payer: Medicare Other | Attending: Internal Medicine | Admitting: Internal Medicine

## 2016-04-04 ENCOUNTER — Encounter (HOSPITAL_COMMUNITY): Payer: Self-pay | Admitting: Emergency Medicine

## 2016-04-04 DIAGNOSIS — Z79899 Other long term (current) drug therapy: Secondary | ICD-10-CM | POA: Insufficient documentation

## 2016-04-04 DIAGNOSIS — I10 Essential (primary) hypertension: Secondary | ICD-10-CM | POA: Diagnosis present

## 2016-04-04 DIAGNOSIS — F0280 Dementia in other diseases classified elsewhere without behavioral disturbance: Secondary | ICD-10-CM | POA: Diagnosis present

## 2016-04-04 DIAGNOSIS — L89610 Pressure ulcer of right heel, unstageable: Secondary | ICD-10-CM | POA: Insufficient documentation

## 2016-04-04 DIAGNOSIS — R269 Unspecified abnormalities of gait and mobility: Secondary | ICD-10-CM | POA: Insufficient documentation

## 2016-04-04 DIAGNOSIS — Z85828 Personal history of other malignant neoplasm of skin: Secondary | ICD-10-CM | POA: Insufficient documentation

## 2016-04-04 DIAGNOSIS — R531 Weakness: Principal | ICD-10-CM

## 2016-04-04 DIAGNOSIS — G20A1 Parkinson's disease without dyskinesia, without mention of fluctuations: Secondary | ICD-10-CM | POA: Diagnosis present

## 2016-04-04 DIAGNOSIS — E785 Hyperlipidemia, unspecified: Secondary | ICD-10-CM | POA: Diagnosis present

## 2016-04-04 DIAGNOSIS — E119 Type 2 diabetes mellitus without complications: Secondary | ICD-10-CM | POA: Diagnosis not present

## 2016-04-04 DIAGNOSIS — I2510 Atherosclerotic heart disease of native coronary artery without angina pectoris: Secondary | ICD-10-CM | POA: Diagnosis present

## 2016-04-04 DIAGNOSIS — G20 Parkinson's disease: Secondary | ICD-10-CM | POA: Diagnosis not present

## 2016-04-04 DIAGNOSIS — M6281 Muscle weakness (generalized): Secondary | ICD-10-CM

## 2016-04-04 DIAGNOSIS — Z5181 Encounter for therapeutic drug level monitoring: Secondary | ICD-10-CM | POA: Diagnosis not present

## 2016-04-04 DIAGNOSIS — L89620 Pressure ulcer of left heel, unstageable: Secondary | ICD-10-CM

## 2016-04-04 DIAGNOSIS — Z7982 Long term (current) use of aspirin: Secondary | ICD-10-CM | POA: Diagnosis not present

## 2016-04-04 LAB — DIFFERENTIAL
BASOS PCT: 1 %
Basophils Absolute: 0.1 10*3/uL (ref 0.0–0.1)
EOS ABS: 0.3 10*3/uL (ref 0.0–0.7)
Eosinophils Relative: 5 %
Lymphocytes Relative: 29 %
Lymphs Abs: 1.7 10*3/uL (ref 0.7–4.0)
MONO ABS: 0.8 10*3/uL (ref 0.1–1.0)
Monocytes Relative: 13 %
NEUTROS ABS: 3.1 10*3/uL (ref 1.7–7.7)
Neutrophils Relative %: 52 %

## 2016-04-04 LAB — URINALYSIS, ROUTINE W REFLEX MICROSCOPIC
Bilirubin Urine: NEGATIVE
GLUCOSE, UA: NEGATIVE mg/dL
HGB URINE DIPSTICK: NEGATIVE
Nitrite: NEGATIVE
Protein, ur: NEGATIVE mg/dL
SPECIFIC GRAVITY, URINE: 1.025 (ref 1.005–1.030)
pH: 6 (ref 5.0–8.0)

## 2016-04-04 LAB — RAPID URINE DRUG SCREEN, HOSP PERFORMED
Amphetamines: NOT DETECTED
BENZODIAZEPINES: NOT DETECTED
Barbiturates: NOT DETECTED
COCAINE: NOT DETECTED
OPIATES: NOT DETECTED
Tetrahydrocannabinol: NOT DETECTED

## 2016-04-04 LAB — APTT: APTT: 29 s (ref 24–36)

## 2016-04-04 LAB — CBC
HCT: 41.9 % (ref 36.0–46.0)
HEMOGLOBIN: 13.8 g/dL (ref 12.0–15.0)
MCH: 32.1 pg (ref 26.0–34.0)
MCHC: 32.9 g/dL (ref 30.0–36.0)
MCV: 97.4 fL (ref 78.0–100.0)
Platelets: 207 10*3/uL (ref 150–400)
RBC: 4.3 MIL/uL (ref 3.87–5.11)
RDW: 13.3 % (ref 11.5–15.5)
WBC: 6 10*3/uL (ref 4.0–10.5)

## 2016-04-04 LAB — COMPREHENSIVE METABOLIC PANEL
ALT: 7 U/L — AB (ref 14–54)
ANION GAP: 7 (ref 5–15)
AST: 23 U/L (ref 15–41)
Albumin: 4.1 g/dL (ref 3.5–5.0)
Alkaline Phosphatase: 58 U/L (ref 38–126)
BUN: 18 mg/dL (ref 6–20)
CHLORIDE: 103 mmol/L (ref 101–111)
CO2: 30 mmol/L (ref 22–32)
Calcium: 9.4 mg/dL (ref 8.9–10.3)
Creatinine, Ser: 1.09 mg/dL — ABNORMAL HIGH (ref 0.44–1.00)
GFR calc non Af Amer: 45 mL/min — ABNORMAL LOW (ref >60)
GFR, EST AFRICAN AMERICAN: 52 mL/min — AB (ref >60)
Glucose, Bld: 102 mg/dL — ABNORMAL HIGH (ref 65–99)
POTASSIUM: 4.6 mmol/L (ref 3.5–5.1)
SODIUM: 140 mmol/L (ref 135–145)
Total Bilirubin: 0.6 mg/dL (ref 0.3–1.2)
Total Protein: 7.1 g/dL (ref 6.5–8.1)

## 2016-04-04 LAB — PROTIME-INR
INR: 1.21
PROTHROMBIN TIME: 15.3 s — AB (ref 11.4–15.2)

## 2016-04-04 LAB — I-STAT TROPONIN, ED: TROPONIN I, POC: 0 ng/mL (ref 0.00–0.08)

## 2016-04-04 LAB — MRSA PCR SCREENING: MRSA BY PCR: NEGATIVE

## 2016-04-04 LAB — URINE MICROSCOPIC-ADD ON

## 2016-04-04 LAB — CBG MONITORING, ED: GLUCOSE-CAPILLARY: 105 mg/dL — AB (ref 65–99)

## 2016-04-04 LAB — GLUCOSE, CAPILLARY: Glucose-Capillary: 121 mg/dL — ABNORMAL HIGH (ref 65–99)

## 2016-04-04 LAB — ETHANOL: Alcohol, Ethyl (B): 5 mg/dL — ABNORMAL HIGH (ref <5)

## 2016-04-04 MED ORDER — ADULT MULTIVITAMIN W/MINERALS CH
1.0000 | ORAL_TABLET | Freq: Every day | ORAL | Status: DC
  Administered 2016-04-04 – 2016-04-05 (×2): 1 via ORAL
  Filled 2016-04-04 (×2): qty 1

## 2016-04-04 MED ORDER — SODIUM CHLORIDE 0.9% FLUSH
3.0000 mL | Freq: Two times a day (BID) | INTRAVENOUS | Status: DC
  Administered 2016-04-05 (×2): 3 mL via INTRAVENOUS

## 2016-04-04 MED ORDER — POLYETHYLENE GLYCOL 3350 17 G PO PACK: 17.0000 g | PACK | Freq: Every day | ORAL | Status: DC | PRN

## 2016-04-04 MED ORDER — SENNA 8.6 MG PO TABS
1.0000 | ORAL_TABLET | Freq: Every day | ORAL | Status: DC
  Administered 2016-04-04: 8.6 mg via ORAL
  Filled 2016-04-04: qty 1

## 2016-04-04 MED ORDER — INSULIN ASPART 100 UNIT/ML ~~LOC~~ SOLN: 0.0000 [IU] | Freq: Three times a day (TID) | SUBCUTANEOUS | Status: DC

## 2016-04-04 MED ORDER — WHITE PETROLATUM GEL
1.0000 | Freq: Every day | Status: DC
Start: 2016-04-05 — End: 2016-04-05
  Administered 2016-04-05: 1 via TOPICAL
  Filled 2016-04-04: qty 28.35

## 2016-04-04 MED ORDER — DOCUSATE SODIUM 100 MG PO CAPS
100.0000 mg | ORAL_CAPSULE | Freq: Two times a day (BID) | ORAL | Status: DC
  Administered 2016-04-04 – 2016-04-05 (×2): 100 mg via ORAL
  Filled 2016-04-04 (×2): qty 1

## 2016-04-04 MED ORDER — CEPHALEXIN 500 MG PO CAPS
500.0000 mg | ORAL_CAPSULE | Freq: Three times a day (TID) | ORAL | Status: DC
  Administered 2016-04-05 (×3): 500 mg via ORAL
  Filled 2016-04-04 (×3): qty 1

## 2016-04-04 MED ORDER — SODIUM CHLORIDE 0.9 % IV BOLUS (SEPSIS)
1000.0000 mL | Freq: Once | INTRAVENOUS | Status: AC
  Administered 2016-04-04: 1000 mL via INTRAVENOUS

## 2016-04-04 MED ORDER — POLYVINYL ALCOHOL 1.4 % OP SOLN
1.0000 [drp] | Freq: Three times a day (TID) | OPHTHALMIC | Status: DC
  Administered 2016-04-04 – 2016-04-05 (×3): 1 [drp] via OPHTHALMIC
  Filled 2016-04-04: qty 15

## 2016-04-04 MED ORDER — ASPIRIN EC 81 MG PO TBEC
81.0000 mg | DELAYED_RELEASE_TABLET | Freq: Every day | ORAL | Status: DC
  Administered 2016-04-05: 81 mg via ORAL
  Filled 2016-04-04: qty 1

## 2016-04-04 MED ORDER — LORAZEPAM 0.5 MG PO TABS
0.5000 mg | ORAL_TABLET | Freq: Two times a day (BID) | ORAL | Status: DC
  Administered 2016-04-05 (×2): 0.5 mg via ORAL
  Filled 2016-04-04 (×2): qty 1

## 2016-04-04 MED ORDER — ENOXAPARIN SODIUM 30 MG/0.3ML ~~LOC~~ SOLN
30.0000 mg | SUBCUTANEOUS | Status: DC
  Administered 2016-04-04: 30 mg via SUBCUTANEOUS
  Filled 2016-04-04: qty 0.3

## 2016-04-04 MED ORDER — AMANTADINE HCL 100 MG PO CAPS
100.0000 mg | ORAL_CAPSULE | Freq: Two times a day (BID) | ORAL | Status: DC
Start: 2016-04-04 — End: 2016-04-05
  Administered 2016-04-05 (×2): 100 mg via ORAL
  Filled 2016-04-04 (×6): qty 1

## 2016-04-04 MED ORDER — CARBIDOPA-LEVODOPA 25-100 MG PO TABS
0.5000 | ORAL_TABLET | Freq: Three times a day (TID) | ORAL | Status: DC
  Administered 2016-04-04 – 2016-04-05 (×3): 0.5 via ORAL
  Filled 2016-04-04 (×3): qty 1

## 2016-04-04 NOTE — H&P (Signed)
History and Physical    Catherine [name] [id number] DOB: Feb 26, 1928 DOA: 04/04/2016  PCP: Renata Caprice, DO   Patient coming from: [facility].  Chief Complaint: Weakness.  HPI: Catherine [name] is a 82 y.o. female with medical history significant of osteoarthritis, for her basal cell CA, Parkinson's disease, dementia and Parkinson's disease, type 2 diabetes, dyslipidemia, hypertension, migraine headaches who is brought to the emergency department due to weakness.  Per patient, records and assisted living staff, the patient has been having abnormal gait and has been noticed tilting to the left when sitting and ambulating for several days. A neighbor at the facility noticed this on Wednesday and encouraged her to come to the emergency department. She denies focal weakness, numbness, headaches, vision changes, slurred speech, word finding or language comprehension issues. She is states that she has been weak, but she is neurologically at her baseline according to her own statements.  ED Course: The patient received 1 L normal saline bolus. Her CBC was normal, chemistry values were normal, with the exception of glucose at 102 and creatinine at 1.09 mg/dL. Her CT scan of the brain did not show any acute intracranial pathology.  Review of Systems: As per HPI otherwise 10 point review of systems negative.    Past Medical History:  Diagnosis Date  . Arthritis   . Cancer ([place])    basal cell-forehead  . Dementia in Parkinson's disease ([place]) 12/25/2015  . Diabetes mellitus without complication ([place])   . Dyslipidemia   . Hypertension   . Migraine without aura, without mention of intractable migraine without mention of status migrainosus   . Obese   . Paralysis agitans ([place]) 09/20/2012  . Parkinson's disease ([place])   . Sciatica of left side   . Tremor     Past Surgical History:  Procedure Laterality Date  . APPENDECTOMY    . Arthroscopic surgery     Left knee  . basal cell  carcinoma resection     Forehead  . CATARACT EXTRACTION W/PHACO Right 11/19/2013   Procedure: CATARACT EXTRACTION PHACO AND INTRAOCULAR LENS PLACEMENT (IOC);  Surgeon: Tonny Branch, MD;  Location: [facility];  Service: Ophthalmology;  Laterality: Right;  CDE:  12.95  . CATARACT EXTRACTION W/PHACO Left 12/17/2013   Procedure: CATARACT EXTRACTION PHACO AND INTRAOCULAR LENS PLACEMENT (IOC);  Surgeon: Tonny Branch, MD;  Location: [facility];  Service: Ophthalmology;  Laterality: Left;  CDE 12.10  . HIP PINNING,CANNULATED Left 05/23/2015   Procedure: INTERNAL FIXATION LEFT HIP;  Surgeon: Carole Civil, MD;  Location: [facility];  Service: Orthopedics;  Laterality: Left;  . MOUTH SURGERY    . TONSILLECTOMY    . trigger finger surgery     Bilateral thumb     reports that she has never smoked. She has never used smokeless tobacco. She reports that she does not drink alcohol or use drugs.  Allergies  Allergen Reactions  . Sulfa Antibiotics Rash  . Azilect [Rasagiline] Rash  . Fenofibrate Rash  . Sinemet [Carbidopa-Levodopa] Rash  . Tape Rash and Other (See Comments)    Redness  . Tomato Rash    Pt can eat raw tomatoes.Marland Kitchenate tomato sandwich a few days ago    Family History  Problem Relation Age of Onset  . Rheum arthritis Mother   . Heart attack Father     not certain of COD  . Hypertension Paternal Grandfather   . Stroke Paternal Grandfather   . Cirrhosis Paternal Grandmother     Prior  to Admission medications   Medication Sig Start Date End Date Taking? Authorizing Provider  amantadine (SYMMETREL) 100 MG capsule Take 1 capsule (100 mg total) by mouth 2 (two) times daily. 01/06/16  Yes Kathie Dike, MD  aspirin EC 81 MG tablet Take 81 mg by mouth daily.   Yes Historical Provider, MD  carbidopa-levodopa (SINEMET IR) 25-100 MG tablet Take 0.5 tablets by mouth 3 (three) times daily. 10/20/15  Yes Ward Givens, NP  carboxymethylcellulose 1 % ophthalmic solution 1 drop 3 (three) times daily.   Yes  Historical Provider, MD  cephALEXin (KEFLEX) 500 MG capsule Take 1 capsule (500 mg total) by mouth 3 (three) times daily. 01/06/16  Yes Kathie Dike, MD  docusate sodium (COLACE) 100 MG capsule Take 1 capsule (100 mg total) by mouth 2 (two) times daily. 05/25/15  Yes Annita Brod, MD  ENSURE PLUS (ENSURE PLUS) LIQD Take 1 Can by mouth 2 (two) times daily between meals.   Yes Historical Provider, MD  LORazepam (ATIVAN) 0.5 MG tablet Take 0.5 mg by mouth 2 (two) times daily.   Yes Historical Provider, MD  mineral oil liquid Place into both ears every Monday. Instill 2 drops in both ears once daily every Monday for Cerumen Impaction.   Yes Historical Provider, MD  Multiple Vitamin (MULTIVITAMIN) capsule Take 1 capsule by mouth daily.   Yes Historical Provider, MD  QUEtiapine (SEROQUEL) 25 MG tablet Take 1 tablet (25 mg total) by mouth 2 (two) times daily. Take one tab daily and three tabs at bedtime 01/06/16  Yes Kathie Dike, MD  senna (SENOKOT) 8.6 MG TABS tablet Take 1 tablet by mouth at bedtime.   Yes Historical Provider, MD  traZODone (DESYREL) 50 MG tablet Take 50 mg by mouth at bedtime.   Yes Historical Provider, MD  white petrolatum (VASELINE) GEL Apply 1 application topically daily. Applied to bottom, peri area, and thighs topically for rash   Yes Historical Provider, MD  polyethylene glycol (MIRALAX / GLYCOLAX) packet Take 17 g by mouth daily as needed for mild constipation.    Historical Provider, MD    Physical Exam:  Constitutional: NAD, calm, comfortable Vitals:   04/04/16 1645 04/04/16 1700 04/04/16 1718 04/04/16 1833  BP:   137/60 (!) 122/51  Pulse: 76 76 75 76  Resp: 14 17 17 18   Temp:    98 F (36.7 C)  TempSrc:    Other (Comment)  SpO2: 99% 98% 97% 100%  Weight:    57.3 kg (126 lb 5.5 oz)  Height:    5' (1.524 m)   Eyes: PERRL, lids and conjunctivae normal ENMT: Mucous membranes are moist. Posterior pharynx clear of any exudate or lesions. Neck: normal, supple, no  masses, no thyromegaly Respiratory: clear to auscultation bilaterally, no wheezing, no crackles. Normal respiratory effort. No accessory muscle use.  Cardiovascular: Regular rate and rhythm, no murmurs / rubs / gallops. No extremity edema.No carotid bruits.  Abdomen: Soft, no tenderness, no masses palpated. No hepatosplenomegaly. Bowel sounds positive.  Musculoskeletal: no clubbing / cyanosis. Good ROM, no contractures. Normal muscle tone.  Skin: Stage II sacral pressure ulcer. Positive decubiti dressing/padding. Neurologic: CN 2-12 grossly intact. Sensation is grossly normal bilaterally, DTR normal. Generalized weakness with mildly decreased strength on left extremities when compared to right..  Psychiatric: Normal judgment and insight. Alert and oriented x 3, partially oriented to time. Normal mood.    Labs on Admission: I have personally reviewed following labs and imaging studies  CBC:  Recent Labs  Lab 04/04/16 1527 04/04/16 1538  WBC 6.0  --   NEUTROABS  --  3.1  HGB 13.8  --   HCT 41.9  --   MCV 97.4  --   PLT 207  --    Basic Metabolic Panel:  Recent Labs Lab 04/04/16 1538  NA 140  K 4.6  CL 103  CO2 30  GLUCOSE 102*  BUN 18  CREATININE 1.09*  CALCIUM 9.4   GFR: Estimated Creatinine Clearance: 29.4 mL/min (by C-G formula based on SCr of 1.09 mg/dL (H)). Liver Function Tests:  Recent Labs Lab 04/04/16 1538  AST 23  ALT 7*  ALKPHOS 58  BILITOT 0.6  PROT 7.1  ALBUMIN 4.1   No results for input(s): LIPASE, AMYLASE in the last 168 hours. No results for input(s): AMMONIA in the last 168 hours. Coagulation Profile:  Recent Labs Lab 04/04/16 1538  INR 1.21   Cardiac Enzymes: No results for input(s): CKTOTAL, CKMB, CKMBINDEX, TROPONINI in the last 168 hours. BNP (last 3 results) No results for input(s): PROBNP in the last 8760 hours. HbA1C: No results for input(s): HGBA1C in the last 72 hours. CBG:  Recent Labs Lab 04/04/16 1532  GLUCAP 105*    Lipid Profile: No results for input(s): CHOL, HDL, LDLCALC, TRIG, CHOLHDL, LDLDIRECT in the last 72 hours. Thyroid Function Tests: No results for input(s): TSH, T4TOTAL, FREET4, T3FREE, THYROIDAB in the last 72 hours. Anemia Panel: No results for input(s): VITAMINB12, FOLATE, FERRITIN, TIBC, IRON, RETICCTPCT in the last 72 hours. Urine analysis:    Component Value Date/Time   COLORURINE YELLOW 04/04/2016 1621   APPEARANCEUR CLEAR 04/04/2016 1621   LABSPEC 1.025 04/04/2016 1621   PHURINE 6.0 04/04/2016 1621   GLUCOSEU NEGATIVE 04/04/2016 1621   HGBUR NEGATIVE 04/04/2016 1621   BILIRUBINUR NEGATIVE 04/04/2016 1621   KETONESUR TRACE (A) 04/04/2016 1621   PROTEINUR NEGATIVE 04/04/2016 1621   UROBILINOGEN 0.2 12/21/2014 2315   NITRITE NEGATIVE 04/04/2016 1621   LEUKOCYTESUR TRACE (A) 04/04/2016 1621    Radiological Exams on Admission: Ct Head Wo Contrast  Result Date: 04/04/2016 CLINICAL DATA:  New onset left-sided weakness EXAM: CT HEAD WITHOUT CONTRAST TECHNIQUE: Contiguous axial images were obtained from the base of the skull through the vertex without intravenous contrast. COMPARISON:  CT head without contrast 01/02/2016 FINDINGS: Brain: Moderate generalized atrophy and white matter disease is similar to the prior exam. No acute cortical infarct, hemorrhage, or mass lesion is present. The basal ganglia are intact. The insular cortex is intact bilaterally. No acute cortical infarct is present. There is no acute hemorrhage or mass lesion. Ventricles are proportionate to the degree of atrophy. No significant extra-axial fluid collection is present. Vascular: Atherosclerotic calcifications are present within the cavernous internal carotid arteries bilaterally without significant stenosis. Skull: The calvarium is intact. Sinuses/Orbits: The paranasal sinuses and mastoid air cells are clear. Bilateral lens replacements are present. The globes and orbits are otherwise intact. IMPRESSION: 1.  Stable atrophy and white matter disease. This likely reflects the sequela of chronic microvascular ischemia. 2. No acute intracranial abnormality or significant interval change. Electronically Signed   By: San Morelle M.D.   On: 04/04/2016 15:55   01/04/2016 echocardiogram complete  ------------------------------------------------------------------- LV EF: 60% -   65%  ------------------------------------------------------------------- Indications:      MI - acute 410.91.  ------------------------------------------------------------------- History:   PMH:  Parkinsons, Hip Fracture.  PMH:   Myocardial infarction.  Risk factors:  Hypertension. Diabetes mellitus. Dyslipidemia.  ------------------------------------------------------------------- Study Conclusions  - Left  ventricle: The cavity size was normal. Wall thickness was   increased in a pattern of moderate LVH. Systolic function was   normal. The estimated ejection fraction was in the range of 60%   to 65%. Hypokinesis of the basalinferior myocardium. Doppler   parameters are consistent with abnormal left ventricular   relaxation (grade 1 diastolic dysfunction). - Aortic valve: There was mild regurgitation.   EKG: Independently reviewed.  Vent. rate 74 BPM PR interval 142 ms QRS duration 64 ms QT/QTc 370/410 ms P-R-T axes 69 -37 64 Normal sinus rhythm Left axis deviation Low voltage QRS Abnormal ECG No significant changes when compared to 12/2015.  Assessment/Plan Principal Problem:   Generalized weakness Per patient, she is at her neurological baseline. However, generalized weakness and tilting to the left is new. Admit to telemetry/observation. Continue neuro checks. Check carotid Doppler. Check MRI of the brain in a.m.  Continue aspirin.  Consult physical and occupational therapy in a.m. Consult case management/social services in a.m.  Active Problems:   Type 2 diabetes mellitus  (HCC) Carbohydrate modified diet. CBG monitoring before meals and bedtime.    Hyperlipidemia Heart healthy diet.    Parkinson's disease ([place]) Continue Sinemet.    Dementia in Parkinson's disease [facility]) Supportive care.    HTN (hypertension) Not on antihypertensives at this time. Heart healthy/low-sodium diet. Monitor blood pressure.    CAD (coronary artery disease) Not on beta blocker or statin. Continue aspirin 81 mg by mouth daily.       DVT prophylaxis: Lovenox SQ. Code Status: Full code. Family Communication: * Disposition Plan: Admit for further evaluation and treatment. Consults called: PT/OT and case management. Admission status: Observation/telemetry.   Reubin Milan MD [facility] Pager [phone].  If 7PM-7AM, please contact night-coverage [email or web address] Password [facility]  04/04/2016, 6:57 PM

## 2016-04-04 NOTE — ED Notes (Signed)
Attempted to walk pt. Pt could not lift her bottom off of the bed. Pt could barely put weight on her legs.

## 2016-04-04 NOTE — ED Triage Notes (Addendum)
Patient from [place]. Per old neighbor who sits with here every Sunday. He noticed that she was leaning to the left and having weakness on the left side. Denies any slurred speech or facial drooping per neighbor. Patient denies any pain. Per neighbor patient's mental status normal. Registration noted that patient's medication on MAR were not signed as given x6 days. Neighbor states that Nanine Means noticed patient leaning to left on Wednesday. Med tech called and asked Nanine Means about medications and states that patient has had all of her medications today and as far as known she has had them everday but did not print off.

## 2016-04-04 NOTE — ED Provider Notes (Signed)
[facility] DEPT Provider Note   CSN: [id number] Arrival date & time: 04/04/16  1359     History   Chief Complaint Chief Complaint  Patient presents with  . Extremity Weakness    HPI Catherine [name] is a 82 y.o. female.  HPI  82 year old female presents with left-sided leaning. Her neighbor saw that she seemed to be leaning to the left and encourage her to come to the ER. She thinks this has been going on for a couple days. She tells me she does not feel weak on the left side. She has not had any headaches, neck pain, or chest pain. She is having urinary incontinence over the last 1 or 2 months, otherwise denies any new urinary symptoms such as pain. No vomiting or diarrhea. She states the leaning to the left is new. She has also had trouble walking during these last few days and needs assistance which is also new.  Past Medical History:  Diagnosis Date  . Arthritis   . Cancer ([place])    basal cell-forehead  . Dementia in Parkinson's disease ([place]) 12/25/2015  . Diabetes mellitus without complication ([place])   . Dyslipidemia   . Hypertension   . Migraine without aura, without mention of intractable migraine without mention of status migrainosus   . Obese   . Paralysis agitans ([place]) 09/20/2012  . Parkinson's disease ([place])   . Sciatica of left side   . Tremor     Patient Active Problem List   Diagnosis Date Noted  . Generalized weakness 04/04/2016  . UTI (urinary tract infection) 01/04/2016  . Acute encephalopathy 01/02/2016  . NSTEMI (non-ST elevated myocardial infarction) ([place]) 01/02/2016  . HTN (hypertension) 01/02/2016  . Pressure ulcer 01/02/2016  . Altered mental status 01/01/2016  . Dementia in Parkinson's disease ([place]) 12/25/2015  . Parkinson's disease ([place]) 05/24/2015  . 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 Left displaced femoral neck fracture ([place]) 05/23/2015  . Closed left hip fracture ([place])   . Hip fracture ([place]) 05/22/2015  . Weakness 12/22/2014  .  Ataxia 12/22/2014  . PAT (paroxysmal atrial tachycardia) ([place]) 01/25/2013  . Palpitations 01/11/2013  . Paralysis agitans ([place]) 09/20/2012  . Type 2 diabetes mellitus ([place]) 07/21/2011  . Hyperlipidemia 07/21/2011    Past Surgical History:  Procedure Laterality Date  . APPENDECTOMY    . Arthroscopic surgery     Left knee  . basal cell carcinoma resection     Forehead  . CATARACT EXTRACTION W/PHACO Right 11/19/2013   Procedure: CATARACT EXTRACTION PHACO AND INTRAOCULAR LENS PLACEMENT (IOC);  Surgeon: Tonny Branch, MD;  Location: [facility];  Service: Ophthalmology;  Laterality: Right;  CDE:  12.95  . CATARACT EXTRACTION W/PHACO Left 12/17/2013   Procedure: CATARACT EXTRACTION PHACO AND INTRAOCULAR LENS PLACEMENT (IOC);  Surgeon: Tonny Branch, MD;  Location: [facility];  Service: Ophthalmology;  Laterality: Left;  CDE 12.10  . HIP PINNING,CANNULATED Left 05/23/2015   Procedure: INTERNAL FIXATION LEFT HIP;  Surgeon: Carole Civil, MD;  Location: [facility];  Service: Orthopedics;  Laterality: Left;  . MOUTH SURGERY    . TONSILLECTOMY    . trigger finger surgery     Bilateral thumb    OB History    No data available       Home Medications    Prior to Admission medications   Medication Sig Start Date End Date Taking? Authorizing Provider  amantadine (SYMMETREL) 100 MG capsule Take 1 capsule (100 mg total) by mouth 2 (two) times daily. 01/06/16  Yes Kathie Dike, MD  aspirin EC 81 MG tablet Take 81 mg by mouth daily.   Yes Historical Provider, MD  carbidopa-levodopa (SINEMET IR) 25-100 MG tablet Take 0.5 tablets by mouth 3 (three) times daily. 10/20/15  Yes Ward Givens, NP  carboxymethylcellulose 1 % ophthalmic solution 1 drop 3 (three) times daily.   Yes Historical Provider, MD  cephALEXin (KEFLEX) 500 MG capsule Take 1 capsule (500 mg total) by mouth 3 (three) times daily. 01/06/16  Yes Kathie Dike, MD  docusate sodium (COLACE) 100 MG capsule Take 1 capsule (100 mg total) by mouth 2 (two)  times daily. 05/25/15  Yes Annita Brod, MD  ENSURE PLUS (ENSURE PLUS) LIQD Take 1 Can by mouth 2 (two) times daily between meals.   Yes Historical Provider, MD  LORazepam (ATIVAN) 0.5 MG tablet Take 0.5 mg by mouth 2 (two) times daily.   Yes Historical Provider, MD  mineral oil liquid Place into both ears every Monday. Instill 2 drops in both ears once daily every Monday for Cerumen Impaction.   Yes Historical Provider, MD  Multiple Vitamin (MULTIVITAMIN) capsule Take 1 capsule by mouth daily.   Yes Historical Provider, MD  QUEtiapine (SEROQUEL) 25 MG tablet Take 1 tablet (25 mg total) by mouth 2 (two) times daily. Take one tab daily and three tabs at bedtime 01/06/16  Yes Kathie Dike, MD  senna (SENOKOT) 8.6 MG TABS tablet Take 1 tablet by mouth at bedtime.   Yes Historical Provider, MD  traZODone (DESYREL) 50 MG tablet Take 50 mg by mouth at bedtime.   Yes Historical Provider, MD  white petrolatum (VASELINE) GEL Apply 1 application topically daily. Applied to bottom, peri area, and thighs topically for rash   Yes Historical Provider, MD  polyethylene glycol (MIRALAX / GLYCOLAX) packet Take 17 g by mouth daily as needed for mild constipation.    Historical Provider, MD    Family History Family History  Problem Relation Age of Onset  . Rheum arthritis Mother   . Heart attack Father     not certain of COD  . Hypertension Paternal Grandfather   . Stroke Paternal Grandfather   . Cirrhosis Paternal Grandmother     Social History Social History  Substance Use Topics  . Smoking status: Never Smoker  . Smokeless tobacco: Never Used  . Alcohol use No     Allergies   Sulfa antibiotics; Azilect [rasagiline]; Fenofibrate; Sinemet [carbidopa-levodopa]; Tape; and Tomato   Review of Systems Review of Systems  Constitutional: Negative for fever.  Respiratory: Negative for shortness of breath.   Cardiovascular: Negative for chest pain.  Gastrointestinal: Negative for abdominal pain,  diarrhea and vomiting.  Genitourinary: Negative for dysuria.  Musculoskeletal: Positive for gait problem.  Neurological: Positive for weakness. Negative for headaches.  All other systems reviewed and are negative.    Physical Exam Updated Vital Signs BP 137/60 (BP Location: Right Arm)   Pulse 75   Temp 98.6 F (37 C) (Rectal)   Resp 17   Ht 5' (1.524 m)   Wt 120 lb (54.4 kg)   SpO2 97%   BMI 23.44 kg/m   Physical Exam  Constitutional: She is oriented to person, place, and time. She appears well-developed and well-nourished.  HENT:  Head: Normocephalic and atraumatic.  Right Ear: External ear normal.  Left Ear: External ear normal.  Nose: Nose normal.  Eyes: Right eye exhibits no discharge. Left eye exhibits no discharge.  Cardiovascular: Normal rate, regular rhythm and normal heart sounds.  Pulmonary/Chest: Effort normal and breath sounds normal.  Abdominal: Soft. There is no tenderness.  Neurological: She is alert and oriented to person, place, and time.  CN 3-12 grossly intact. 5/5 strength in all 4 extremities, maybe slight weakness on left. Grossly normal sensation.   Skin: Skin is warm and dry.  Nursing note and vitals reviewed.    ED Treatments / Results  Labs (all labs ordered are listed, but only abnormal results are displayed) Labs Reviewed  URINALYSIS, ROUTINE W REFLEX MICROSCOPIC (NOT AT [facility]) - Abnormal; Notable for the following:       Result Value   Ketones, ur TRACE (*)    Leukocytes, UA TRACE (*)    All other components within normal limits  ETHANOL - Abnormal; Notable for the following:    Alcohol, Ethyl (B) 5 (*)    All other components within normal limits  PROTIME-INR - Abnormal; Notable for the following:    Prothrombin Time 15.3 (*)    All other components within normal limits  COMPREHENSIVE METABOLIC PANEL - Abnormal; Notable for the following:    Glucose, Bld 102 (*)    Creatinine, Ser 1.09 (*)    ALT 7 (*)    GFR calc non Af Amer 45  (*)    GFR calc Af Amer 52 (*)    All other components within normal limits  URINE MICROSCOPIC-ADD ON - Abnormal; Notable for the following:    Squamous Epithelial / LPF 6-30 (*)    Bacteria, UA FEW (*)    All other components within normal limits  CBG MONITORING, ED - Abnormal; Notable for the following:    Glucose-Capillary 105 (*)    All other components within normal limits  CBC  APTT  DIFFERENTIAL  RAPID URINE DRUG SCREEN, HOSP PERFORMED  I-STAT TROPOININ, ED    EKG  EKG Interpretation  Date/Time:  Sunday April 04 2016 14:18:21 EST Ventricular Rate:  74 PR Interval:  142 QRS Duration: 64 QT Interval:  370 QTC Calculation: 410 R Axis:   -37 Text Interpretation:  Normal sinus rhythm Left axis deviation Low voltage QRS Abnormal ECG no significant change since Aug 2017 Confirmed by Regenia Skeeter MD, Brayden Betters [phone]) on 04/04/2016 3:24:57 PM       Radiology Ct Head Wo Contrast  Result Date: 04/04/2016 CLINICAL DATA:  New onset left-sided weakness EXAM: CT HEAD WITHOUT CONTRAST TECHNIQUE: Contiguous axial images were obtained from the base of the skull through the vertex without intravenous contrast. COMPARISON:  CT head without contrast 01/02/2016 FINDINGS: Brain: Moderate generalized atrophy and white matter disease is similar to the prior exam. No acute cortical infarct, hemorrhage, or mass lesion is present. The basal ganglia are intact. The insular cortex is intact bilaterally. No acute cortical infarct is present. There is no acute hemorrhage or mass lesion. Ventricles are proportionate to the degree of atrophy. No significant extra-axial fluid collection is present. Vascular: Atherosclerotic calcifications are present within the cavernous internal carotid arteries bilaterally without significant stenosis. Skull: The calvarium is intact. Sinuses/Orbits: The paranasal sinuses and mastoid air cells are clear. Bilateral lens replacements are present. The globes and orbits are  otherwise intact. IMPRESSION: 1. Stable atrophy and white matter disease. This likely reflects the sequela of chronic microvascular ischemia. 2. No acute intracranial abnormality or significant interval change. Electronically Signed   By: San Morelle M.D.   On: 04/04/2016 15:55    Procedures Procedures (including critical care time)  Medications Ordered in ED Medications  sodium chloride 0.9 %  bolus 1,000 mL (1,000 mLs Intravenous New Bag/Given 04/04/16 1717)     Initial Impression / Assessment and Plan / ED Course  I have reviewed the triage vital signs and the nursing notes.  Pertinent labs & imaging results that were available during my care of the patient were reviewed by me and considered in my medical decision making (see chart for details).  Clinical Course as of Apr 04 1821  Nancy Fetter Apr 04, 2016  1532 Will do a stroke workup. Patient's history is a little unclear from her, but it appears the weakness and leaning to the left started at least one day ago. Not a code stroke. We'll also check for infectious symptoms such as UTI that could be contributing.  [SG]    Clinical Course User Index [SG] Sherwood Gambler, MD    Patient has no focal findings on workup except for mild AKI. Given IV fluids. When patient was ambulated, she was unable to even get up out of bed because of generalized weakness. Given she is leaning to the left she probably needs an MRI to rule out stroke but will deftly need admission given she cannot get up to walk which is definitely not her baseline. Dr. Olevia Bowens to admit.  Final Clinical Impressions(s) / ED Diagnoses   Final diagnoses:  Generalized weakness    New Prescriptions New Prescriptions   No medications on file     Sherwood Gambler, MD 04/04/16 [phone]

## 2016-04-04 NOTE — ED Notes (Signed)
Pt stable and ready for transport to [facility].  Report called to Threasa Alpha, RN.

## 2016-04-05 ENCOUNTER — Observation Stay (HOSPITAL_COMMUNITY): Payer: Medicare Other

## 2016-04-05 DIAGNOSIS — G20 Parkinson's disease: Secondary | ICD-10-CM | POA: Diagnosis not present

## 2016-04-05 DIAGNOSIS — R531 Weakness: Secondary | ICD-10-CM | POA: Diagnosis not present

## 2016-04-05 DIAGNOSIS — F0280 Dementia in other diseases classified elsewhere without behavioral disturbance: Secondary | ICD-10-CM

## 2016-04-05 DIAGNOSIS — I10 Essential (primary) hypertension: Secondary | ICD-10-CM | POA: Diagnosis not present

## 2016-04-05 LAB — BASIC METABOLIC PANEL
ANION GAP: 7 (ref 5–15)
BUN: 18 mg/dL (ref 6–20)
CALCIUM: 9.1 mg/dL (ref 8.9–10.3)
CHLORIDE: 107 mmol/L (ref 101–111)
CO2: 26 mmol/L (ref 22–32)
Creatinine, Ser: 0.84 mg/dL (ref 0.44–1.00)
GFR calc non Af Amer: >60 mL/min (ref >60)
GLUCOSE: 98 mg/dL (ref 65–99)
POTASSIUM: 3.8 mmol/L (ref 3.5–5.1)
Sodium: 140 mmol/L (ref 135–145)

## 2016-04-05 LAB — GLUCOSE, CAPILLARY
GLUCOSE-CAPILLARY: 83 mg/dL (ref 65–99)
GLUCOSE-CAPILLARY: 89 mg/dL (ref 65–99)
Glucose-Capillary: 91 mg/dL (ref 65–99)

## 2016-04-05 LAB — HEMOGLOBIN: Hemoglobin: 13.6 g/dL (ref 12.0–15.0)

## 2016-04-05 MED ORDER — AMANTADINE HCL 100 MG PO CAPS
ORAL_CAPSULE | ORAL | Status: AC
  Filled 2016-04-05: qty 1

## 2016-04-05 NOTE — Progress Notes (Signed)
IV Removed, WNL. Tele removed. Awaiting D/C papers and pt will be transported back to [facility] in [place].

## 2016-04-05 NOTE — Care Management Obs Status (Signed)
[facility] NOTIFICATION   Patient Details  Name: Catherine [name] MRN: [id number] Date of Birth: 06/04/1927   Medicare Observation Status Notification Given:  Yes    Sherald Barge, RN 04/05/2016, 11:59 AM

## 2016-04-05 NOTE — Care Management Note (Signed)
Case Management Note  Patient Details  Name: Catherine [name] MRN: [id number] Date of Birth: [birth year]  Subjective/Objective:                  Pt admitted with generalized weakness. She lives at [facility]. She is ind with ADL's at baseline. She plans to return to [facility] at [facility]. PT has recommended PT at ALF and pt is agreeable to receiving PT through [facility] staff. CSW will make arrangements for return to facility and make [facility] aware pt will need PT.   Action/Plan: No CM needs anticipated.   Expected Discharge Date:    04/05/2016              Expected Discharge Plan:  Assisted Living / [facility] (with PT)  In-House Referral:  Clinical Social Work  Discharge planning Services  CM Consult  Post Acute Care Choice:  Home Health Choice offered to:  Patient  HH Arranged:  PT [place]:  [facility]  Status of Service:  Completed, signed off  Sherald Barge, RN 04/05/2016, 12:01 PM

## 2016-04-05 NOTE — Progress Notes (Signed)
D/C instructions given to patient and husband. Husband transporting back to [facility].

## 2016-04-05 NOTE — Evaluation (Signed)
Physical Therapy Evaluation Patient Details Name: Catherine [name] MRN: [id number] DOB: November 07, 1927 Today's Date: 04/05/2016   History of Present Illness  82 y.o. female with medical history significant of osteoarthritis, for her basal cell CA, Parkinson's disease, dementia and Parkinson's disease, type 2 diabetes, dyslipidemia, hypertension, migraine headaches who is brought to the emergency department due to concerns of L sided weakness.  Clinical Impression  Pt received in bed, neighbor and caregiver present, and pt is agreeable to PT evaluation.  Pt required assistance for transfers from her lift chair <>w/c, as well as assistance for short distance ambulation with RW to the bathroom.  She requires assistance for dressing and bathing.  During PT evaluation, she demonstrates generalized weakness, however does not seem to me to have weakness greater on one side vs the other.  She requires Mod A for supine<>sit, and Mod A for sit<>stand.  She was able to ambulate 45ft with RW, however she demonstrates a posterior lean and scissoring.  She is recommended to return back to [facility], with HHPT for strengthening, gait and safety awareness.      Follow Up Recommendations Home health PT;Supervision/Assistance - 24 hour (Back to ALF - [facility])    Equipment Recommendations  None recommended by PT    Recommendations for Other Services       Precautions / Restrictions Precautions Precautions: Fall Precaution Comments: Due to PLOF, PMH of Parkinsons, and mobility status during PT evaluation.  Restrictions Weight Bearing Restrictions: No      Mobility  Bed Mobility Overal bed mobility: Needs Assistance Bed Mobility: Supine to Sit     Supine to sit: Mod assist;HOB elevated (Significant amount of increased time, and use of bed pad to assist hips to the EOB.  )        Transfers Overall transfer level: Needs assistance Equipment used: Rolling walker (2 wheeled) Transfers: Sit to/from  Stand Sit to Stand: Mod assist         General transfer comment: increased time to obtain upright position, however continues to demonstrate a posterior lean    Ambulation/Gait Ambulation/Gait assistance: Mod assist Ambulation Distance (Feet): 30 Feet Assistive device: Rolling walker (2 wheeled) Gait Pattern/deviations: Festinating;Scissoring;Trunk flexed   Gait velocity interpretation: <1.8 ft/sec, indicative of risk for recurrent falls General Gait Details: Pt demonstrates significant scissoring of R foot, which initially required manual assistance to prevent.  Pt demonstrates near consistent posterior lean with brief moments where she is able to shift weight far enough anterior where it is over her base of support, but she is not able to maintain.    Stairs            Wheelchair Mobility    Modified Rankin (Stroke Patients Only)       Balance Overall balance assessment: Needs assistance Sitting-balance support: Bilateral upper extremity supported;Feet supported Sitting balance-Leahy Scale: Poor Sitting balance - Comments: B UE support, with close supervision, and vc's to lean fwd and get her feet on the floor.     Standing balance support: Bilateral upper extremity supported Standing balance-Leahy Scale: Zero Standing balance comment: Posterior lean                             Pertinent Vitals/Pain Pain Assessment: No/denies pain    Home Living   Living Arrangements:  (ALF) Available Help at Discharge: Personal care attendant (ALF) Type of Home: Assisted living [facility])         Home  Equipment: Gilford Rile - 2 wheels;Wheelchair - manual Additional Comments: Pt sleeps in a lift chair, and uses it to come sit<>stand.     Prior Function Level of Independence: Needs assistance   Gait / Transfers Assistance Needed: Pt requires assistance for transfers - sometimes uses RW, and sometimes performs SPT with w/c in front of her.   ADL's / Homemaking  Assistance Needed: Assistance for dressing and bathing.         Hand Dominance   Dominant Hand: Right    Extremity/Trunk Assessment   Upper Extremity Assessment: Generalized weakness;RUE deficits/detail (B shoulders limited due to arthritis - Able to elevate ~95*) RUE Deficits / Details: Noted resting tremor, which is greater on the right.          Lower Extremity Assessment: Generalized weakness;RLE deficits/detail RLE Deficits / Details: Noted increased cogwheel rigidity, as well as resting tremor.  Tends to stay in an adducted position    Cervical / Trunk Assessment: Kyphotic  Communication      Cognition Arousal/Alertness: Awake/alert Behavior During Therapy: WFL for tasks assessed/performed Overall Cognitive Status: Within Functional Limits for tasks assessed                      General Comments General comments (skin integrity, edema, etc.): Chart reports a Stage II sacral decubitus - not visualized during tx due to covered by Mepilex.     Exercises     Assessment/Plan    PT Assessment Patient needs continued PT services  PT Problem List Decreased strength;Decreased activity tolerance;Decreased range of motion;Decreased balance;Decreased mobility;Decreased cognition;Decreased knowledge of use of DME;Decreased coordination;Decreased safety awareness;Decreased knowledge of precautions;Decreased skin integrity;Impaired tone          PT Treatment Interventions DME instruction;Gait training;Functional mobility training;Therapeutic activities;Therapeutic exercise;Balance training;Neuromuscular re-education;Patient/family education    PT Goals (Current goals can be found in the Care Plan section)  Acute Rehab PT Goals Patient Stated Goal: Pt wants to be able to walk to the bathroom.  PT Goal Formulation: With patient Time For Goal Achievement: 04/12/16 Potential to Achieve Goals: Fair    Frequency Min 4X/week   Barriers to discharge         Co-evaluation               End of Session Equipment Utilized During Treatment: Gait belt Activity Tolerance: Patient limited by fatigue Patient left: in chair;with call bell/phone within reach;with family/visitor present Nurse Communication: Mobility status ([place], RN notified of mobility status and visualized pt ambulating in the hall.  Mobility sheet placed in the room. )    Functional Assessment Tool Used: [employer]	[place] AM-PAC "6-clicks"  Functional Limitation: Mobility: Walking and moving around Mobility: Walking and Moving Around Current Status [phone]): At least 60 percent but less than 80 percent impaired, limited or restricted Mobility: Walking and Moving Around Goal Status [phone]): At least 40 percent but less than 60 percent impaired, limited or restricted    Time: 0901-0930 PT Time Calculation (min) (ACUTE ONLY): 29 min   Charges:   PT Evaluation $PT Eval Low Complexity: 1 Procedure PT Treatments $Gait Training: 8-22 mins   PT G Codes:   PT G-Codes **NOT FOR INPATIENT CLASS** Functional Assessment Tool Used: [employer] "6-clicks"  Functional Limitation: Mobility: Walking and moving around Mobility: Walking and Moving Around Current Status [phone]): At least 60 percent but less than 80 percent impaired, limited or restricted Mobility: Walking and Moving Around Goal Status [phone]): At least 40 percent but less than  60 percent impaired, limited or restricted    Beth Ramsie Ostrander, PT, DPT X: [phone]

## 2016-04-05 NOTE — Discharge Summary (Signed)
Physician Discharge Summary  Catherine [name] [id number] DOB: Jan 15, 1928 DOA: 04/04/2016  PCP: Renata Caprice, DO  Admit date: 04/04/2016 Discharge date: 04/05/2016  Admitted From: Home Disposition:  Home.   Recommendations for Outpatient Follow-up:  1. Follow up with PCP in 1-2 weeks  Home Health: None Equipment/Devices:Already had 4 point walker. Discharge Condition: Back to baseline.  Normal mentation.  No leaning to the left.  CODE STATUS:DNR Diet recommendation: As previous  Brief/Interim Summary:  Patient was admitted for weakness by Dr Everlena Cooper on Apr 04, 2016.  As per his prior H and P:  " Catherine [name] is a 82 y.o. female with medical history significant of osteoarthritis, for her basal cell CA, Parkinson's disease, dementia and Parkinson's disease, type 2 diabetes, dyslipidemia, hypertension, migraine headaches who is brought to the emergency department due to weakness.  Per patient, records and assisted living staff, the patient has been having abnormal gait and has been noticed tilting to the left when sitting and ambulating for several days. A neighbor at the facility noticed this on Wednesday and encouraged her to come to the emergency department. She denies focal weakness, numbness, headaches, vision changes, slurred speech, word finding or language comprehension issues. She is states that she has been weak, but she is neurologically at her baseline according to her own statements.  ED Course: The patient received 1 L normal saline bolus. Her CBC was normal, chemistry values were normal, with the exception of glucose at 102 and creatinine at 1.09 mg/dL. Her CT scan of the brain did not show any acute intracranial pathology.  Review of Systems: As per HPI otherwise 10 point review of systems negative.   HOSPITAL COURSE:  Patient was admitted for TIA/CVA r/out, as her care taker/ POA was concerned that she was leaning to the left.  Her work up in the ER was unremarkable,  including a head CT.  She was continued on her meds, and the following day, she was back to baseline.  Her caretaker along with her assistant confirmed that she is back to her baseline.  I examined her and she has no neurological deficit.  She has no cogwheel rigidity and was mentating normally.  Her MRI showed no CVA.  She would like to go home, and is stable for discharge.  She will remain on her own medication, and she will follow up with her PCP and neurologist as planned.  Thank you so much, Good Day.   Discharge Diagnoses:  Principal Problem:   Generalized weakness Active Problems:   Type 2 diabetes mellitus (HCC)   Hyperlipidemia   Parkinson's disease (HCC)   Dementia in Parkinson's disease (HCC)   HTN (hypertension)   CAD (coronary artery disease)   Pressure injury of skin    Discharge Instructions  Discharge Instructions    Diet - low sodium heart healthy    Complete by:  As directed    Discharge instructions    Complete by:  As directed    Resume activities as previous.  See your PCP next week for follow up.   Increase activity slowly    Complete by:  As directed        Medication List    TAKE these medications   amantadine 100 MG capsule Commonly known as:  SYMMETREL Take 1 capsule (100 mg total) by mouth 2 (two) times daily.   aspirin EC 81 MG tablet Take 81 mg by mouth daily.   carbidopa-levodopa 25-100 MG tablet Commonly known as:  SINEMET IR Take 0.5 tablets by mouth 3 (three) times daily.   carboxymethylcellulose 1 % ophthalmic solution 1 drop 3 (three) times daily.   cephALEXin 500 MG capsule Commonly known as:  KEFLEX Take 1 capsule (500 mg total) by mouth 3 (three) times daily.   docusate sodium 100 MG capsule Commonly known as:  COLACE Take 1 capsule (100 mg total) by mouth 2 (two) times daily.   ENSURE PLUS Liqd Take 1 Can by mouth 2 (two) times daily between meals.   LORazepam 0.5 MG tablet Commonly known as:  ATIVAN Take 0.5 mg by  mouth 2 (two) times daily.   mineral oil liquid Place into both ears every Monday. Instill 2 drops in both ears once daily every Monday for Cerumen Impaction.   multivitamin capsule Take 1 capsule by mouth daily.   polyethylene glycol packet Commonly known as:  MIRALAX / GLYCOLAX Take 17 g by mouth daily as needed for mild constipation.   QUEtiapine 25 MG tablet Commonly known as:  SEROQUEL Take 1 tablet (25 mg total) by mouth 2 (two) times daily. Take one tab daily and three tabs at bedtime   senna 8.6 MG Tabs tablet Commonly known as:  SENOKOT Take 1 tablet by mouth at bedtime.   traZODone 50 MG tablet Commonly known as:  DESYREL Take 50 mg by mouth at bedtime.   VASELINE Gel Generic drug:  white petrolatum Apply 1 application topically daily. Applied to bottom, peri area, and thighs topically for rash       Allergies  Allergen Reactions  . Sulfa Antibiotics Rash  . Azilect [Rasagiline] Rash  . Fenofibrate Rash  . Sinemet [Carbidopa-Levodopa] Rash  . Tape Rash and Other (See Comments)    Redness  . Tomato Rash    Pt can eat raw tomatoes.Marland Kitchenate tomato sandwich a few days ago    Consultations:  NONE>    Procedures/Studies: Ct Head Wo Contrast  Result Date: 04/04/2016 CLINICAL DATA:  New onset left-sided weakness EXAM: CT HEAD WITHOUT CONTRAST TECHNIQUE: Contiguous axial images were obtained from the base of the skull through the vertex without intravenous contrast. COMPARISON:  CT head without contrast 01/02/2016 FINDINGS: Brain: Moderate generalized atrophy and white matter disease is similar to the prior exam. No acute cortical infarct, hemorrhage, or mass lesion is present. The basal ganglia are intact. The insular cortex is intact bilaterally. No acute cortical infarct is present. There is no acute hemorrhage or mass lesion. Ventricles are proportionate to the degree of atrophy. No significant extra-axial fluid collection is present. Vascular: Atherosclerotic  calcifications are present within the cavernous internal carotid arteries bilaterally without significant stenosis. Skull: The calvarium is intact. Sinuses/Orbits: The paranasal sinuses and mastoid air cells are clear. Bilateral lens replacements are present. The globes and orbits are otherwise intact. IMPRESSION: 1. Stable atrophy and white matter disease. This likely reflects the sequela of chronic microvascular ischemia. 2. No acute intracranial abnormality or significant interval change. Electronically Signed   By: San Morelle M.D.   On: 04/04/2016 15:55   Mr Brain Wo Contrast  Result Date: 04/05/2016 CLINICAL DATA:  Weakness and confusion for 3 days.  Unable to walk. EXAM: MRI HEAD WITHOUT CONTRAST TECHNIQUE: Multiplanar, multiecho pulse sequences of the brain and surrounding structures were obtained without intravenous contrast. COMPARISON:  CT head without contrast 04/04/2016. MRI brain 04/01/2015. FINDINGS: Brain: The diffusion-weighted images demonstrate no evidence for acute or subacute infarction. Mild atrophy and white matter disease is present bilaterally, within normal limits for  age. No acute hemorrhage or mass lesion is present. Basal ganglia are intact. The brainstem and cerebellum are normal. The internal auditory canals are within normal limits bilaterally. Vascular: Flow is present in the major intracranial arteries. Skull and upper cervical spine: The skullbase is within normal limits. Midline sagittal structures are unremarkable. Sinuses/Orbits: The paranasal sinuses are clear. Bilateral lens replacements are noted. The globes and orbits are otherwise intact. IMPRESSION: 1. Stable mild atrophy and white matter disease likely reflects the sequela of chronic microvascular ischemia. 2. No acute intracranial abnormality. Electronically Signed   By: San Morelle M.D.   On: 04/05/2016 11:19       Subjective:  Feeling better today.    Discharge Exam: Vitals:   04/05/16  0800 04/05/16 1315  BP: (!) 149/63 (!) 156/78  Pulse: 66 77  Resp:  20  Temp: 97.6 F (36.4 C) 97.8 F (36.6 C)   Vitals:   04/04/16 2030 04/05/16 0700 04/05/16 0800 04/05/16 1315  BP: (!) 118/54 (!) 120/59 (!) 149/63 (!) 156/78  Pulse: 72 70 66 77  Resp: 18 18  20   Temp: 97 F (36.1 C) 97.9 F (36.6 C) 97.6 F (36.4 C) 97.8 F (36.6 C)  TempSrc: Axillary Axillary Oral Oral  SpO2: 100% 100% 97% 95%  Weight:      Height:        General: Pt is alert, awake, not in acute distress Cardiovascular: RRR, S1/S2 +, no rubs, no gallops Respiratory: CTA bilaterally, no wheezing, no rhonchi Abdominal: Soft, NT, ND, bowel sounds + Extremities: no edema, no cyanosis    The results of significant diagnostics from this hospitalization (including imaging, microbiology, ancillary and laboratory) are listed below for reference.     Microbiology: Recent Results (from the past 240 hour(s))  MRSA PCR Screening     Status: None   Collection Time: 04/04/16  6:27 PM  Result Value Ref Range Status   MRSA by PCR NEGATIVE NEGATIVE Final    Comment:        The GeneXpert MRSA Assay (FDA approved for NASAL specimens only), is one component of a comprehensive MRSA colonization surveillance program. It is not intended to diagnose MRSA infection nor to guide or monitor treatment for MRSA infections.      Basic Metabolic Panel:  Recent Labs Lab 04/04/16 1538 04/05/16 0443  NA 140 140  K 4.6 3.8  CL 103 107  CO2 30 26  GLUCOSE 102* 98  BUN 18 18  CREATININE 1.09* 0.84  CALCIUM 9.4 9.1   Liver Function Tests:  Recent Labs Lab 04/04/16 1538  AST 23  ALT 7*  ALKPHOS 58  BILITOT 0.6  PROT 7.1  ALBUMIN 4.1   CBC:  Recent Labs Lab 04/04/16 1527 04/04/16 1538 04/05/16 0443  WBC 6.0  --   --   NEUTROABS  --  3.1  --   HGB 13.8  --  13.6  HCT 41.9  --   --   MCV 97.4  --   --   PLT 207  --   --    CBG:  Recent Labs Lab 04/04/16 1532 04/04/16 2145 04/05/16 0750  04/05/16 1127 04/05/16 1622  GLUCAP 105* 121* 91 83 89   Urinalysis    Component Value Date/Time   COLORURINE YELLOW 04/04/2016 [place] 04/04/2016 1621   LABSPEC 1.025 04/04/2016 1621   PHURINE 6.0 04/04/2016 1621   GLUCOSEU NEGATIVE 04/04/2016 1621   HGBUR NEGATIVE 04/04/2016 1621   BILIRUBINUR  NEGATIVE 04/04/2016 1621   KETONESUR TRACE (A) 04/04/2016 1621   PROTEINUR NEGATIVE 04/04/2016 1621   UROBILINOGEN 0.2 12/21/2014 2315   NITRITE NEGATIVE 04/04/2016 1621   LEUKOCYTESUR TRACE (A) 04/04/2016 1621   Sepsis Labs Invalid input(s): PROCALCITONIN,  WBC,  LACTICIDVEN Microbiology Recent Results (from the past 240 hour(s))  MRSA PCR Screening     Status: None   Collection Time: 04/04/16  6:27 PM  Result Value Ref Range Status   MRSA by PCR NEGATIVE NEGATIVE Final    Comment:        The GeneXpert MRSA Assay (FDA approved for NASAL specimens only), is one component of a comprehensive MRSA colonization surveillance program. It is not intended to diagnose MRSA infection nor to guide or monitor treatment for MRSA infections.      Time coordinating discharge: Over 30 minutes  SIGNED:  Orvan Falconer, MD FACP [facility] 04/05/2016, 4:54 PM   If 7PM-7AM, please contact night-coverage [email or web address] Password [email or web address]

## 2016-05-27 ENCOUNTER — Ambulatory Visit: Payer: Medicare Other | Admitting: Nurse Practitioner

## 2016-07-15 ENCOUNTER — Ambulatory Visit (INDEPENDENT_AMBULATORY_CARE_PROVIDER_SITE_OTHER): Payer: Medicare Other | Admitting: Nurse Practitioner

## 2016-07-15 ENCOUNTER — Encounter: Payer: Self-pay | Admitting: Nurse Practitioner

## 2016-07-15 VITALS — BP 143/80 | HR 78

## 2016-07-15 DIAGNOSIS — F0280 Dementia in other diseases classified elsewhere without behavioral disturbance: Secondary | ICD-10-CM

## 2016-07-15 DIAGNOSIS — R531 Weakness: Secondary | ICD-10-CM | POA: Diagnosis not present

## 2016-07-15 DIAGNOSIS — R269 Unspecified abnormalities of gait and mobility: Secondary | ICD-10-CM | POA: Diagnosis not present

## 2016-07-15 DIAGNOSIS — G20 Parkinson's disease: Secondary | ICD-10-CM | POA: Diagnosis not present

## 2016-07-15 NOTE — Progress Notes (Signed)
I have read the note, and I agree with the clinical assessment and plan.  WILLIS,CHARLES KEITH   

## 2016-07-15 NOTE — Progress Notes (Signed)
[facility]  PATIENT: MACKINSEY PELLAND DOB: 1927-08-18   REASON FOR VISIT: Follow-up for Parkinson's disease dementia HISTORY FROM: patient and caregiver Sharyn Lull   HISTORY OF PRESENT ILLNESS:UPDATE 07/15/16 CM Ms. Hatfield, 83 year old female returns for follow-up with history of Parkinson's disease and memory loss and significant gait abnormality. When last seen by Dr. Jannifer Franklin she had been hospitalized due to severe hallucinations and confusion. He discontinued her amantadine and Risperdal. She is on low-dose Sinemet and Seroquel. She had an ER visit 01/06/2016 for urinary tract infection. Her amantadine was restarted at that time 100 mg twice daily. She had another ER admission on 04/05/2016 for weakness questionable stroke or TIA as she was leaning to the left side. MRI of the brain no acute intracranial abnormality, stable mild atrophy and white matter disease likely reflects chronic microvascular ischemia. She returns for reevaluation today with her caregiver. She has 24/ 7 caregivers. Ms. Crunk can feed herself but requires assistance with other ADLs. Appetite is good and she is sleeping well at night. She has not had increased episodes of confusion. Hallucinations are well controlled. She is nonambulatory. She returns for reevaluation  HISTORY 12/25/15 KWMs. Mehta is an 83 year old right-handed white female with a history of Parkinson's disease associated with a gait disorder and dementia. The patient has not been seen through this office since 2015, she was followed by Dr. Lily Lovings until recently. The patient required a hospitalization at [facility] secondary due to severe hallucinations and confusion. The patient has been taken down her Sinemet dose to the 25/100 mg tablets taking one half tablet 3 times daily. This has helped her confusion, but she has lost the ability to ambulate. She had been ambulatory with a walker. The patient more recently has had a urinary tract  infection, she currently is on Cipro. The confusion has worsened around this time. The patient is on Seroquel taking 50 mg at night, 25 mg in the morning. She does not consistently sleep at night. She was also on Risperdal taking 0.25 mg at night. The patient takes trazodone 50 mg at night. She is also on amantadine taking 100 mg 3 times daily. The patient sometimes requires assistance with feeding, she requires assistance with transfers, bathing and dressing. She continues to hallucinate on a regular basis, but she does not have severe agitation. She has fallen on one occasion since last seen when she was in the shower. She returns for an evaluation.  REVIEW OF SYSTEMS: Full 14 system review of systems performed and notable only for those listed, all others are neg:  Constitutional: neg  Cardiovascular: neg Ear/Nose/Throat: neg  Skin: neg Eyes: Runny nose Respiratory: neg Gastroitestinal: neg  Hematology/Lymphatic: neg  Endocrine: neg Musculoskeletal: Walking difficulty Allergy/Immunology: neg Neurological: Tremors Psychiatric: Occasional hallucinations Sleep : neg   ALLERGIES: Allergies  Allergen Reactions  . Sulfa Antibiotics Rash  . Azilect [Rasagiline] Rash  . Fenofibrate Rash  . Sinemet [Carbidopa-Levodopa] Rash  . Tape Rash and Other (See Comments)    Redness  . Tomato Rash    Pt can eat raw tomatoes.Marland Kitchenate tomato sandwich a few days ago    HOME MEDICATIONS: Outpatient Medications Prior to Visit  Medication Sig Dispense Refill  . amantadine (SYMMETREL) 100 MG capsule Take 1 capsule (100 mg total) by mouth 2 (two) times daily. 60 capsule 0  . carbidopa-levodopa (SINEMET IR) 25-100 MG tablet Take 0.5 tablets by mouth 3 (three) times daily. 45 tablet 3  . carboxymethylcellulose 1 % ophthalmic solution 1 drop  3 (three) times daily.    Marland Kitchen docusate sodium (COLACE) 100 MG capsule Take 1 capsule (100 mg total) by mouth 2 (two) times daily. 10 capsule 0  . ENSURE PLUS (ENSURE PLUS)  LIQD Take 1 Can by mouth 2 (two) times daily between meals.    Marland Kitchen LORazepam (ATIVAN) 0.5 MG tablet Take 0.5 mg by mouth 2 (two) times daily.    . mineral oil liquid Place into both ears every Monday. Instill 2 drops in both ears once daily every Monday for Cerumen Impaction.    . Multiple Vitamin (MULTIVITAMIN) capsule Take 1 capsule by mouth daily.    . polyethylene glycol (MIRALAX / GLYCOLAX) packet Take 17 g by mouth daily as needed for mild constipation (pt takes mon wed and friday).     Marland Kitchen senna (SENOKOT) 8.6 MG TABS tablet Take 1 tablet by mouth at bedtime.    . traZODone (DESYREL) 50 MG tablet Take 50 mg by mouth at bedtime.    . white petrolatum (VASELINE) GEL Apply 1 application topically daily. Applied to bottom, peri area, and thighs topically for rash    . aspirin EC 81 MG tablet Take 81 mg by mouth daily.    . cephALEXin (KEFLEX) 500 MG capsule Take 1 capsule (500 mg total) by mouth 3 (three) times daily. (Patient not taking: Reported on 07/15/2016) 6 capsule 0  . QUEtiapine (SEROQUEL) 25 MG tablet Take 1 tablet (25 mg total) by mouth 2 (two) times daily. Take one tab daily and three tabs at bedtime (Patient not taking: Reported on 07/15/2016)     No facility-administered medications prior to visit.     PAST MEDICAL HISTORY: Past Medical History:  Diagnosis Date  . Arthritis   . Cancer ([place])    basal cell-forehead  . Dementia in Parkinson's disease ([place]) 12/25/2015  . Diabetes mellitus without complication ([place])   . Dyslipidemia   . Hypertension   . Migraine without aura, without mention of intractable migraine without mention of status migrainosus   . Obese   . Paralysis agitans ([place]) 09/20/2012  . Parkinson's disease ([place])   . Sciatica of left side   . Tremor     PAST SURGICAL HISTORY: Past Surgical History:  Procedure Laterality Date  . APPENDECTOMY    . Arthroscopic surgery     Left knee  . basal cell carcinoma resection     Forehead  . CATARACT EXTRACTION W/PHACO  Right 11/19/2013   Procedure: CATARACT EXTRACTION PHACO AND INTRAOCULAR LENS PLACEMENT (IOC);  Surgeon: Tonny Branch, MD;  Location: [facility];  Service: Ophthalmology;  Laterality: Right;  CDE:  12.95  . CATARACT EXTRACTION W/PHACO Left 12/17/2013   Procedure: CATARACT EXTRACTION PHACO AND INTRAOCULAR LENS PLACEMENT (IOC);  Surgeon: Tonny Branch, MD;  Location: [facility];  Service: Ophthalmology;  Laterality: Left;  CDE 12.10  . HIP PINNING,CANNULATED Left 05/23/2015   Procedure: INTERNAL FIXATION LEFT HIP;  Surgeon: Carole Civil, MD;  Location: [facility];  Service: Orthopedics;  Laterality: Left;  . MOUTH SURGERY    . TONSILLECTOMY    . trigger finger surgery     Bilateral thumb    FAMILY HISTORY: Family History  Problem Relation Age of Onset  . Rheum arthritis Mother   . Heart attack Father     not certain of COD  . Hypertension Paternal Grandfather   . Stroke Paternal Grandfather   . Cirrhosis Paternal Grandmother     SOCIAL HISTORY: Social History   Social History  . Marital  status: Widowed    Spouse name: N/A  . Number of children: 0  . Years of education: N/A   Occupational History  . Retired     Optometrist [employer]   Social History Main Topics  . Smoking status: Never Smoker  . Smokeless tobacco: Never Used  . Alcohol use No  . Drug use: No  . Sexual activity: Not on file   Other Topics Concern  . Not on file   Social History Narrative   Resides at [place]:   07/15/16 0755  BP: (!) 143/80  Pulse: 78   There is no height or weight on file to calculate BMI.  Generalized: Well developed, in no acute distress  Head: normocephalic and atraumatic,. Oropharynx benign  Neck: Supple, no carotid bruits  Cardiac: Regular rate rhythm, no murmur  Musculoskeletal: No deformity   Neurological examination   Mentation: Alert oriented to time, place, month. Knows the president.  Attention span and concentration appropriate.   Follows  all commands speech and language fluent.   Cranial nerve II-XII: Fundoscopic exam not done Pupils were equal round reactive to light extraocular movements were full, visual field were full on confrontational test. Facial sensation and strength were normal. hearing was intact to finger rubbing bilaterally. Uvula tongue midline. head turning and shoulder shrug were normal and symmetric.Tongue protrusion into cheek strength was normal. Motor: full strength in the BUE, BLE, good ROM without contractures fine finger movements normal,  resting tremor right hand Sensory: normal and symmetric to light touch, on the face arms and legs Coordination: finger-nose-finger,good but apraxia with  heel-to-shin bilaterally, Reflexes Symmetric upper and lower plantar responses were flexor bilaterally. Gait and Station  cannot be ambulated, she can stand with 2+ assistance but leans backwards DIAGNOSTIC DATA (LABS, IMAGING, TESTING) - I reviewed patient records, labs, notes, testing and imaging myself where available.  Lab Results  Component Value Date   WBC 6.0 04/04/2016   HGB 13.6 04/05/2016   HCT 41.9 04/04/2016   MCV 97.4 04/04/2016   PLT 207 04/04/2016      Component Value Date/Time   NA 140 04/05/2016 0443   K 3.8 04/05/2016 0443   CL 107 04/05/2016 0443   CO2 26 04/05/2016 0443   GLUCOSE 98 04/05/2016 0443   BUN 18 04/05/2016 0443   CREATININE 0.84 04/05/2016 0443   CALCIUM 9.1 04/05/2016 0443   PROT 7.1 04/04/2016 1538   ALBUMIN 4.1 04/04/2016 1538   AST 23 04/04/2016 1538   ALT 7 (L) 04/04/2016 1538   ALKPHOS 58 04/04/2016 1538   BILITOT 0.6 04/04/2016 1538   GFRNONAA >60 04/05/2016 0443   GFRAA >60 04/05/2016 0443    Lab Results  Component Value Date   TSH 3.102 01/01/2016      ASSESSMENT AND PLAN  83 y.o. year old female  has a past medical history of Dementia in Parkinson's disease ([place]) (12/25/2015);  , Parkinson's disease and severe gait disorder. As her Sinemet dosage has  been reduced her confusion and hallucinations have improved but  her ability to ambulate has significantly declined. 2 ER admissions since last seen 1 for urinary tract infection and 1 for questionable CVA however MRI nothing acute stable mild atrophy and white matter disease likely reflects chronic microvascular ischemia.Records reviewed She was started back on her amantadine in the hospital, so far has not had increased confusion  PLAN: Continue Sinemet at current dose Continue amantadine at current dose Continue  Seroquel 25 mg a.m. and 50 mg at bedtime. Patient has 24 7 sitters for safety and assistance with ADLs and medication management Follow-up in 6 months Dennie Bible, [facility], [facility], APRN  [facility] [address] [place], [place] [address] [phone]

## 2016-07-15 NOTE — Patient Instructions (Signed)
Per nursing home sheet 

## 2016-08-15 ENCOUNTER — Emergency Department (HOSPITAL_COMMUNITY): Payer: Medicare Other

## 2016-08-15 ENCOUNTER — Emergency Department (HOSPITAL_COMMUNITY)
Admission: EM | Admit: 2016-08-15 | Discharge: 2016-08-15 | Disposition: A | Discharge status: Home / Self Care | Payer: Medicare Other | Attending: Emergency Medicine | Admitting: Emergency Medicine

## 2016-08-15 DIAGNOSIS — E119 Type 2 diabetes mellitus without complications: Secondary | ICD-10-CM | POA: Diagnosis not present

## 2016-08-15 DIAGNOSIS — R079 Chest pain, unspecified: Secondary | ICD-10-CM | POA: Insufficient documentation

## 2016-08-15 DIAGNOSIS — I2510 Atherosclerotic heart disease of native coronary artery without angina pectoris: Secondary | ICD-10-CM | POA: Diagnosis not present

## 2016-08-15 DIAGNOSIS — I10 Essential (primary) hypertension: Secondary | ICD-10-CM | POA: Insufficient documentation

## 2016-08-15 DIAGNOSIS — G20 Parkinson's disease: Secondary | ICD-10-CM | POA: Insufficient documentation

## 2016-08-15 DIAGNOSIS — F0390 Unspecified dementia without behavioral disturbance: Secondary | ICD-10-CM | POA: Insufficient documentation

## 2016-08-15 LAB — BASIC METABOLIC PANEL
ANION GAP: 9 (ref 5–15)
BUN: 17 mg/dL (ref 6–20)
CO2: 30 mmol/L (ref 22–32)
Calcium: 10 mg/dL (ref 8.9–10.3)
Chloride: 104 mmol/L (ref 101–111)
Creatinine, Ser: 1.08 mg/dL — ABNORMAL HIGH (ref 0.44–1.00)
GFR calc Af Amer: 52 mL/min — ABNORMAL LOW (ref >60)
GFR, EST NON AFRICAN AMERICAN: 45 mL/min — AB (ref >60)
GLUCOSE: 118 mg/dL — AB (ref 65–99)
POTASSIUM: 4.1 mmol/L (ref 3.5–5.1)
Sodium: 143 mmol/L (ref 135–145)

## 2016-08-15 LAB — URINALYSIS, ROUTINE W REFLEX MICROSCOPIC
Bacteria, UA: NONE SEEN
Bilirubin Urine: NEGATIVE
GLUCOSE, UA: NEGATIVE mg/dL
Hgb urine dipstick: NEGATIVE
Ketones, ur: 5 mg/dL — AB
NITRITE: NEGATIVE
PROTEIN: NEGATIVE mg/dL
SPECIFIC GRAVITY, URINE: 1.023 (ref 1.005–1.030)
Squamous Epithelial / LPF: NONE SEEN
pH: 5 (ref 5.0–8.0)

## 2016-08-15 LAB — CBC
HEMATOCRIT: 45.2 % (ref 36.0–46.0)
HEMOGLOBIN: 15.4 g/dL — AB (ref 12.0–15.0)
MCH: 32.6 pg (ref 26.0–34.0)
MCHC: 34.1 g/dL (ref 30.0–36.0)
MCV: 95.8 fL (ref 78.0–100.0)
PLATELETS: 153 10*3/uL (ref 150–400)
RBC: 4.72 MIL/uL (ref 3.87–5.11)
RDW: 13.2 % (ref 11.5–15.5)
WBC: 10.9 10*3/uL — ABNORMAL HIGH (ref 4.0–10.5)

## 2016-08-15 LAB — I-STAT TROPONIN, ED: TROPONIN I, POC: 0 ng/mL (ref 0.00–0.08)

## 2016-08-15 MED ORDER — SODIUM CHLORIDE 0.9 % IV BOLUS (SEPSIS): 1000.0000 mL | Freq: Once | INTRAVENOUS | Status: DC

## 2016-08-15 MED ORDER — IBUPROFEN 400 MG PO TABS
400.0000 mg | ORAL_TABLET | Freq: Once | ORAL | Status: AC
  Administered 2016-08-15: 400 mg via ORAL
  Filled 2016-08-15: qty 1

## 2016-08-15 NOTE — ED Notes (Signed)
Pt unable to provide urine sample at this time 

## 2016-08-15 NOTE — Discharge Instructions (Signed)
Tests in the emergency department including EKG, chest x-ray, troponin were all negative. Recommend 2 Tylenol tablets or 2 ibuprofen 200 mg tablets for pain. These can be taken 2-3 times per day.

## 2016-08-15 NOTE — ED Provider Notes (Signed)
[facility] DEPT Provider Note   CSN: [id number] Arrival date & time: 08/15/16  1234  By signing my name below, I, Catherine [name], attest that this documentation has been prepared under the direction and in the presence of Catherine Christen, MD.  Electronically Signed: Julien [name], ED Scribe. 08/15/16. 12:59 PM.    History   Chief Complaint Chief Complaint  Patient presents with  . Chest Pain   LEVEL V CAVEAT: HPI and ROS limited due to dementia   The history is provided by a friend and the patient. The history is limited by the condition of the patient.   HPI Comments: Catherine [name] is a 83 y.o. female who has a PMhx of dementia w/ parkinson's disease, DLD, HTN, and DM presents to the Emergency Department complaining of moderate, gradual worsening, left inferior chest pain that began last night. She describes the pain as a sharp sensation. Pt is a resident at [facility]. She is non-ambulatory and gets around with a wheel chair. Pt consumed one sausage biscuit and some ginger ale this morning. Friend states pt appears more pale than usual. She denies shortness of breath, diaphoresis, nausea, and loss of appetite.  Past Medical History:  Diagnosis Date  . Arthritis   . Cancer ([place])    basal cell-forehead  . Dementia in Parkinson's disease ([place]) 12/25/2015  . Diabetes mellitus without complication ([place])   . Dyslipidemia   . Hypertension   . Migraine without aura, without mention of intractable migraine without mention of status migrainosus   . Obese   . Paralysis agitans ([place]) 09/20/2012  . Parkinson's disease ([place])   . Sciatica of left side   . Tremor     Patient Active Problem List   Diagnosis Date Noted  . Gait abnormality 07/15/2016  . Generalized weakness 04/04/2016  . General weakness 04/04/2016  . CAD (coronary artery disease) 04/04/2016  . Pressure injury of skin 04/04/2016  . UTI (urinary tract infection) 01/04/2016  . Acute encephalopathy 01/02/2016    . NSTEMI (non-ST elevated myocardial infarction) ([place]) 01/02/2016  . HTN (hypertension) 01/02/2016  . Pressure ulcer 01/02/2016  . Altered mental status 01/01/2016  . Dementia in Parkinson's disease ([place]) 12/25/2015  . Parkinson's disease ([place]) 05/24/2015  . Parkinsonian tremor ([place]) 05/24/2015  . Left displaced femoral neck fracture ([place]) 05/23/2015  . Closed left hip fracture ([place])   . Hip fracture ([place]) 05/22/2015  . Weakness 12/22/2014  . Ataxia 12/22/2014  . PAT (paroxysmal atrial tachycardia) ([place]) 01/25/2013  . Palpitations 01/11/2013  . Paralysis agitans ([place]) 09/20/2012  . Type 2 diabetes mellitus ([place]) 07/21/2011  . Hyperlipidemia 07/21/2011    Past Surgical History:  Procedure Laterality Date  . APPENDECTOMY    . Arthroscopic surgery     Left knee  . basal cell carcinoma resection     Forehead  . CATARACT EXTRACTION W/PHACO Right 11/19/2013   Procedure: CATARACT EXTRACTION PHACO AND INTRAOCULAR LENS PLACEMENT (IOC);  Surgeon: Tonny Branch, MD;  Location: [facility];  Service: Ophthalmology;  Laterality: Right;  CDE:  12.95  . CATARACT EXTRACTION W/PHACO Left 12/17/2013   Procedure: CATARACT EXTRACTION PHACO AND INTRAOCULAR LENS PLACEMENT (IOC);  Surgeon: Tonny Branch, MD;  Location: [facility];  Service: Ophthalmology;  Laterality: Left;  CDE 12.10  . HIP PINNING,CANNULATED Left 05/23/2015   Procedure: INTERNAL FIXATION LEFT HIP;  Surgeon: Carole Civil, MD;  Location: [facility];  Service: Orthopedics;  Laterality: Left;  . MOUTH SURGERY    . TONSILLECTOMY    .  trigger finger surgery     Bilateral thumb    OB History    No data available       Home Medications    Prior to Admission medications   Medication Sig Start Date End Date Taking? Authorizing Provider  amantadine (SYMMETREL) 100 MG capsule Take 1 capsule (100 mg total) by mouth 2 (two) times daily. 01/06/16  Yes Kathie Dike, MD  carbidopa-levodopa (SINEMET IR) 25-100 MG tablet Take 0.5 tablets by mouth 3  (three) times daily. 10/20/15  Yes Ward Givens, NP  carboxymethylcellulose 1 % ophthalmic solution 1 drop 3 (three) times daily.   Yes Historical Provider, MD  cetirizine (ZYRTEC) 10 MG tablet Take 10 mg by mouth daily.   Yes Historical Provider, MD  docusate sodium (COLACE) 100 MG capsule Take 1 capsule (100 mg total) by mouth 2 (two) times daily. 05/25/15  Yes Annita Brod, MD  ENSURE PLUS (ENSURE PLUS) LIQD Take 1 Can by mouth 2 (two) times daily between meals.   Yes Historical Provider, MD  guaiFENesin-dextromethorphan (ROBITUSSIN DM) 100-10 MG/5ML syrup Take 15 mLs by mouth every 4 (four) hours as needed for cough.   Yes Historical Provider, MD  LORazepam (ATIVAN) 0.5 MG tablet Take 0.5 mg by mouth 2 (two) times daily.   Yes Historical Provider, MD  mineral oil liquid Place into both ears every Monday. Instill 2 drops in both ears once daily every Monday for Cerumen Impaction.   Yes Historical Provider, MD  Multiple Vitamin (MULTIVITAMIN) capsule Take 1 capsule by mouth daily.   Yes Historical Provider, MD  ondansetron (ZOFRAN) 4 MG tablet Take 4 mg by mouth every 6 (six) hours as needed for nausea or vomiting.    Yes Historical Provider, MD  polyethylene glycol (MIRALAX / GLYCOLAX) packet Take 17 g by mouth daily as needed for mild constipation (pt takes mon wed and friday).    Yes Historical Provider, MD  QUEtiapine (SEROQUEL) 50 MG tablet 25 mg.  07/01/16  Yes Historical Provider, MD  senna (SENOKOT) 8.6 MG TABS tablet Take 1 tablet by mouth at bedtime.   Yes Historical Provider, MD  traZODone (DESYREL) 50 MG tablet Take 50 mg by mouth at bedtime.   Yes Historical Provider, MD  white petrolatum (VASELINE) GEL Apply 1 application topically daily. Applied to bottom, peri area, and thighs topically for rash   Yes Historical Provider, MD    Family History Family History  Problem Relation Age of Onset  . Rheum arthritis Mother   . Heart attack Father     not certain of COD  .  Hypertension Paternal Grandfather   . Stroke Paternal Grandfather   . Cirrhosis Paternal Grandmother     Social History Social History  Substance Use Topics  . Smoking status: Never Smoker  . Smokeless tobacco: Never Used  . Alcohol use No     Allergies   Sulfa antibiotics; Azilect [rasagiline]; Fenofibrate; Sinemet [carbidopa-levodopa]; Tape; and Tomato   Review of Systems Review of Systems  A complete 10 system review of systems was obtained and all systems are negative except as noted in the HPI and PMH.   LEVEL V CAVEAT: HPI and ROS limited due to dementia    Physical Exam Updated Vital Signs BP 117/73 (BP Location: Left Arm)   Pulse 80   Temp 97.6 F (36.4 C) (Oral)   Resp 20   Ht 5' (1.524 m)   Wt 122 lb (55.3 kg)   SpO2 95%   BMI 23.83  kg/m   Physical Exam  Constitutional: She is oriented to person, place, and time. She appears well-developed and well-nourished.  HENT:  Head: Normocephalic and atraumatic.  Eyes: Conjunctivae are normal.  Neck: Neck supple.  Cardiovascular: Normal rate and regular rhythm.   Pulmonary/Chest: Effort normal and breath sounds normal.  Abdominal: Soft. Bowel sounds are normal.  Musculoskeletal: Normal range of motion.  Neurological: She is alert and oriented to person, place, and time.  Skin: Skin is warm and dry.  Psychiatric: Her behavior is normal.  Flat affect  Nursing note and vitals reviewed.    ED Treatments / Results  DIAGNOSTIC STUDIES: Oxygen Saturation is 96% on RA, adequate by my interpretation.  COORDINATION OF CARE:  12:56 PM Discussed treatment plan with friend and pt at bedside and they agreed to plan.  Labs (all labs ordered are listed, but only abnormal results are displayed) Labs Reviewed  BASIC METABOLIC PANEL - Abnormal; Notable for the following:       Result Value   Glucose, Bld 118 (*)    Creatinine, Ser 1.08 (*)    GFR calc non Af Amer 45 (*)    GFR calc Af Amer 52 (*)    All other  components within normal limits  CBC - Abnormal; Notable for the following:    WBC 10.9 (*)    Hemoglobin 15.4 (*)    All other components within normal limits  URINALYSIS, ROUTINE W REFLEX MICROSCOPIC  I-STAT TROPOININ, ED    EKG  EKG Interpretation  Date/Time:  Sunday August 15 2016 12:44:53 EDT Ventricular Rate:  107 PR Interval:    QRS Duration: 88 QT Interval:  336 QTC Calculation: 449 R Axis:   -38 Text Interpretation:  Normal sinus rhythm Inferior infarct, old Reconfirmed by Alioune Hodgkin  MD, Gabor Lusk ([id number]) on 08/15/2016 2:49:45 PM       Radiology Dg Chest Portable 1 View  Result Date: 08/15/2016 CLINICAL DATA:  Patient c/o left non-radiating chest/breast pain that started this morning. Denies cardiac hx. Patient states some shortness of breath and weakness EXAM: PORTABLE CHEST 1 VIEW COMPARISON:  Chest x-rays dated 01/03/2016 and 05/22/2015. FINDINGS: Study is slightly hypoinspiratory with crowding of the perihilar bronchovascular markings. Coarse interstitial lung markings are seen bilaterally suggesting some degree of chronic interstitial lung disease. Suspect chronic bronchitic changes centrally. No pleural effusion or pneumothorax seen. Heart size and mediastinal contours are stable. Atherosclerotic changes noted at the aortic arch. IMPRESSION: 1. No active disease.  No evidence of pneumonia or pulmonary edema. 2. Suspected chronic interstitial lung disease and chronic bronchitic changes. 3. Aortic atherosclerosis. Electronically Signed   By: Franki Cabot M.D.   On: 08/15/2016 13:13    Procedures Procedures (including critical care time)  Medications Ordered in ED Medications  ibuprofen (ADVIL,MOTRIN) tablet 400 mg (400 mg Oral Given 08/15/16 1536)     Initial Impression / Assessment and Plan / ED Course  I have reviewed the triage vital signs and the nursing notes.  Pertinent labs & imaging results that were available during my care of the patient were reviewed by me and  considered in my medical decision making (see chart for details).     Patient is in no acute distress. Screening tests including EKG, chest x-ray, labs, troponin all negative. Will recommend Tylenol or ibuprofen for pain.  Final Clinical Impressions(s) / ED Diagnoses   Final diagnoses:  Chest pain, unspecified type   I personally performed the services described in this documentation, which was scribed in  my presence. The recorded information has been reviewed and is accurate.   New Prescriptions New Prescriptions   No medications on file     Catherine Christen, MD 08/15/16 1610

## 2016-08-15 NOTE — ED Triage Notes (Signed)
Patient c/o left non-radiating chest/breast pain that started this morning. Denies cardiac hx. Patient states some shortness of breath and weakness.

## 2017-01-19 NOTE — Progress Notes (Signed)
[facility]  PATIENT: Catherine [name] DOB: 1928-01-13   REASON FOR VISIT: Follow-up for Parkinson's disease, gait abnormality, HISTORY FROM: patient and caregiver [place] ILLNESS:UPDATE 09/13/2018CM Catherine [name], 83 year old female returns for follow-up with her caregiver Jeani Hawking she has a history of Parkinson's disease and memory loss and significant abnormality. She has 24 7 care. She can feed herself but needs a assistance with other ADLs. She is transferred to the wheelchair daily. She no longer ambulates. No hallucinations. Appetite is good and she is sleeping well. She returns for reevaluation    UPDATE 07/15/16 CM Catherine [name], 83 year old female returns for follow-up with history of Parkinson's disease and memory loss and significant gait abnormality. When last seen by Dr. Jannifer Franklin she had been hospitalized due to severe hallucinations and confusion. He discontinued her amantadine and Risperdal. She is on low-dose Sinemet and Seroquel. She had an ER visit 01/06/2016 for urinary tract infection. Her amantadine was restarted at that time 100 mg twice daily. She had another ER admission on 04/05/2016 for weakness questionable stroke or TIA as she was leaning to the left side. MRI of the brain no acute intracranial abnormality, stable mild atrophy and white matter disease likely reflects chronic microvascular ischemia. She returns for reevaluation today with her caregiver. She has 24/ 7 caregivers. [name] can feed herself but requires assistance with other ADLs. Appetite is good and she is sleeping well at night. She has not had increased episodes of confusion. Hallucinations are well controlled. She is nonambulatory. She returns for reevaluation  HISTORY 12/25/15 Catherine [name]. Catherine [name] is an 83 year old right-handed white female with a history of Parkinson's disease associated with a gait disorder and dementia. The patient has not been seen through this office since  2015, she was followed by Dr. Lily Lovings until recently. The patient required a hospitalization at [facility] due to severe hallucinations and confusion. The patient has been taken down her Sinemet dose to the 25/100 mg tablets taking one half tablet 3 times daily. This has helped her confusion, but she has lost the ability to ambulate. She had been ambulatory with a walker. The patient more recently has had a urinary tract infection, she currently is on Cipro. The confusion has worsened around this time. The patient is on Seroquel taking 50 mg at night, 25 mg in the morning. She does not consistently sleep at night. She was also on Risperdal taking 0.25 mg at night. The patient takes trazodone 50 mg at night. She is also on amantadine taking 100 mg 3 times daily. The patient sometimes requires assistance with feeding, she requires assistance with transfers, bathing and dressing. She continues to hallucinate on a regular basis, but she does not have severe agitation. She has fallen on one occasion since last seen when she was in the shower. She returns for an evaluation.  REVIEW OF SYSTEMS: Full 14 system review of systems performed and notable only for those listed, all others are neg:  Constitutional: neg  Cardiovascular: neg Ear/Nose/Throat: neg  Skin: neg Eyes: Runny nose Respiratory: neg Gastroitestinal: neg  Hematology/Lymphatic: neg  Endocrine: neg Musculoskeletal: Walking difficulty Allergy/Immunology: neg Neurological: Tremors Psychiatric: neg Sleep : neg   ALLERGIES: Allergies  Allergen Reactions  . Sulfa Antibiotics Rash  . Azilect [Rasagiline] Rash  . Fenofibrate Rash  . Sinemet [Carbidopa-Levodopa] Rash  . Tape Rash and Other (See Comments)    Redness  . Tomato Rash    Pt can eat raw tomatoes.Marland Kitchenate tomato sandwich  a few days ago    HOME MEDICATIONS: Outpatient Medications Prior to Visit  Medication Sig Dispense Refill  . amantadine (SYMMETREL) 100 MG capsule  Take 1 capsule (100 mg total) by mouth 2 (two) times daily. 60 capsule 0  . carbidopa-levodopa (SINEMET IR) 25-100 MG tablet Take 0.5 tablets by mouth 3 (three) times daily. 45 tablet 3  . carboxymethylcellulose 1 % ophthalmic solution 1 drop 3 (three) times daily.    . cetirizine (ZYRTEC) 10 MG tablet Take 10 mg by mouth daily.    Marland Kitchen docusate sodium (COLACE) 100 MG capsule Take 1 capsule (100 mg total) by mouth 2 (two) times daily. 10 capsule 0  . ENSURE PLUS (ENSURE PLUS) LIQD Take 1 Can by mouth 2 (two) times daily between meals.    Marland Kitchen LORazepam (ATIVAN) 0.5 MG tablet Take 0.5 mg by mouth 2 (two) times daily.    . mineral oil liquid Place into both ears every Monday. Instill 2 drops in both ears once daily every Monday for Cerumen Impaction.    . Multiple Vitamin (MULTIVITAMIN) capsule Take 1 capsule by mouth daily.    . ondansetron (ZOFRAN) 4 MG tablet Take 4 mg by mouth every 6 (six) hours as needed for nausea or vomiting.     . polyethylene glycol (MIRALAX / GLYCOLAX) packet Take 17 g by mouth daily as needed for mild constipation (pt takes mon wed and friday).     . QUEtiapine (SEROQUEL) 50 MG tablet 25 mg.     . senna (SENOKOT) 8.6 MG TABS tablet Take 1 tablet by mouth at bedtime.    . traZODone (DESYREL) 50 MG tablet Take 50 mg by mouth at bedtime.    . white petrolatum (VASELINE) GEL Apply 1 application topically daily. Applied to bottom, peri area, and thighs topically for rash    . guaiFENesin-dextromethorphan (ROBITUSSIN DM) 100-10 MG/5ML syrup Take 15 mLs by mouth every 4 (four) hours as needed for cough.     No facility-administered medications prior to visit.     PAST MEDICAL HISTORY: Past Medical History:  Diagnosis Date  . Arthritis   . Cancer ([place])    basal cell-forehead  . Dementia in Parkinson's disease ([place]) 12/25/2015  . Diabetes mellitus without complication ([place])   . Dyslipidemia   . Hypertension   . Migraine without aura, without mention of intractable migraine  without mention of status migrainosus   . Obese   . Paralysis agitans ([place]) 09/20/2012  . Parkinson's disease ([place])   . Sciatica of left side   . Tremor     PAST SURGICAL HISTORY: Past Surgical History:  Procedure Laterality Date  . APPENDECTOMY    . Arthroscopic surgery     Left knee  . basal cell carcinoma resection     Forehead  . CATARACT EXTRACTION W/PHACO Right 11/19/2013   Procedure: CATARACT EXTRACTION PHACO AND INTRAOCULAR LENS PLACEMENT (IOC);  Surgeon: Tonny Branch, MD;  Location: [facility];  Service: Ophthalmology;  Laterality: Right;  CDE:  12.95  . CATARACT EXTRACTION W/PHACO Left 12/17/2013   Procedure: CATARACT EXTRACTION PHACO AND INTRAOCULAR LENS PLACEMENT (IOC);  Surgeon: Tonny Branch, MD;  Location: [facility];  Service: Ophthalmology;  Laterality: Left;  CDE 12.10  . HIP PINNING,CANNULATED Left 05/23/2015   Procedure: INTERNAL FIXATION LEFT HIP;  Surgeon: Carole Civil, MD;  Location: [facility];  Service: Orthopedics;  Laterality: Left;  . MOUTH SURGERY    . TONSILLECTOMY    . trigger finger surgery  Bilateral thumb    FAMILY HISTORY: Family History  Problem Relation Age of Onset  . Rheum arthritis Mother   . Heart attack Father        not certain of COD  . Hypertension Paternal Grandfather   . Stroke Paternal Grandfather   . Cirrhosis Paternal Grandmother     SOCIAL HISTORY: Social History   Social History  . Marital status: Widowed    Spouse name: N/A  . Number of children: 0  . Years of education: N/A   Occupational History  . Retired     Optometrist [employer]   Social History Main Topics  . Smoking status: Never Smoker  . Smokeless tobacco: Never Used  . Alcohol use No  . Drug use: No  . Sexual activity: Not on file   Other Topics Concern  . Not on file   Social History Narrative   Resides at [facility]:   01/20/17 0926  BP: 140/68  Pulse: 69   There is no height or weight on file to calculate  BMI.  Generalized: Well developed, in no acute distress  Head: normocephalic and atraumatic,. Oropharynx benign  Neck: Supple, no carotid bruits  Cardiac: Regular rate rhythm, no murmur  Musculoskeletal: No deformity   Neurological examination   Mentation: Alert oriented to time, place, month. Knows the president.  Attention span and concentration appropriate.   Follows all commands speech and language fluent.   Cranial nerve II-XII: Pupils were equal round reactive to light extraocular movements were full, visual field were full on confrontational test. Facial sensation and strength were normal. hearing was intact to finger rubbing bilaterally. Uvula tongue midline. head turning and shoulder shrug were normal and symmetric.Tongue protrusion into cheek strength was normal. Motor: full strength in the BUE, BLE, good ROM without contractures mild left foot drop  Sensory: normal and symmetric to light touch, on the face arms and legs Coordination: finger-nose-finger,good but apraxia with  heel-to-shin bilaterally, Reflexes Symmetric upper and lower plantar responses were flexor bilaterally. Gait and Station  cannot be ambulated, she can stand with 2+ assistance but leans backwards DIAGNOSTIC DATA (LABS, IMAGING, TESTING) - I reviewed patient records, labs, notes, testing and imaging myself where available.  Lab Results  Component Value Date   WBC 10.9 (H) 08/15/2016   HGB 15.4 (H) 08/15/2016   HCT 45.2 08/15/2016   MCV 95.8 08/15/2016   PLT 153 08/15/2016      Component Value Date/Time   NA 143 08/15/2016 1311   K 4.1 08/15/2016 1311   CL 104 08/15/2016 1311   CO2 30 08/15/2016 1311   GLUCOSE 118 (H) 08/15/2016 1311   BUN 17 08/15/2016 1311   CREATININE 1.08 (H) 08/15/2016 1311   CALCIUM 10.0 08/15/2016 1311   PROT 7.1 04/04/2016 1538   ALBUMIN 4.1 04/04/2016 1538   AST 23 04/04/2016 1538   ALT 7 (L) 04/04/2016 1538   ALKPHOS 58 04/04/2016 1538   BILITOT 0.6 04/04/2016 1538    GFRNONAA 45 (L) 08/15/2016 1311   GFRAA 52 (L) 08/15/2016 1311    Lab Results  Component Value Date   TSH 3.102 01/01/2016      ASSESSMENT AND PLAN  83 y.o. year old female  has a past medical history of  , Parkinson's disease and severe gait disorder. As her Sinemet dosage has been reduced her confusion and hallucinations have improved but  her ability to ambulate has significantly declined. 1  ER  admissions since last seen  for chest pain.  PLAN: Continue Sinemet at current dose Continue amantadine at current dose Continue Seroquel 25 mg a.m. and 50 mg at bedtime. Patient has 24 7 sitters for safety and assistance with ADLs and medication management Follow-up in 6 months Dennie Bible, [facility], [facility], APRN  [facility] [address], [place], [place] [address] [phone]

## 2017-01-20 ENCOUNTER — Encounter: Payer: Self-pay | Admitting: Nurse Practitioner

## 2017-01-20 ENCOUNTER — Ambulatory Visit (INDEPENDENT_AMBULATORY_CARE_PROVIDER_SITE_OTHER): Payer: Medicare Other | Admitting: Nurse Practitioner

## 2017-01-20 VITALS — BP 140/68 | HR 69

## 2017-01-20 DIAGNOSIS — R269 Unspecified abnormalities of gait and mobility: Secondary | ICD-10-CM

## 2017-01-20 DIAGNOSIS — G20 Parkinson's disease: Secondary | ICD-10-CM

## 2017-01-20 MED ORDER — CARBIDOPA-LEVODOPA 25-100 MG PO TABS: 0.5000 | ORAL_TABLET | Freq: Three times a day (TID) | ORAL | 7 refills | Status: DC

## 2017-01-20 NOTE — Progress Notes (Signed)
I have read the note, and I agree with the clinical assessment and plan.  [place] KEITH   

## 2017-01-20 NOTE — Patient Instructions (Signed)
Per skilled sheet 

## 2017-02-03 ENCOUNTER — Observation Stay (HOSPITAL_COMMUNITY): Payer: Medicare Other

## 2017-02-03 ENCOUNTER — Encounter (HOSPITAL_COMMUNITY): Payer: Self-pay | Admitting: Family Medicine

## 2017-02-03 ENCOUNTER — Observation Stay (HOSPITAL_COMMUNITY)
Admission: EM | Admit: 2017-02-03 | Discharge: 2017-02-04 | Disposition: A | Discharge status: Home / Self Care | Payer: Medicare Other | Attending: Family Medicine | Admitting: Family Medicine

## 2017-02-03 ENCOUNTER — Emergency Department (HOSPITAL_COMMUNITY): Payer: Medicare Other

## 2017-02-03 DIAGNOSIS — R4189 Other symptoms and signs involving cognitive functions and awareness: Secondary | ICD-10-CM

## 2017-02-03 DIAGNOSIS — I252 Old myocardial infarction: Secondary | ICD-10-CM | POA: Insufficient documentation

## 2017-02-03 DIAGNOSIS — G459 Transient cerebral ischemic attack, unspecified: Secondary | ICD-10-CM

## 2017-02-03 DIAGNOSIS — R55 Syncope and collapse: Secondary | ICD-10-CM

## 2017-02-03 DIAGNOSIS — I2510 Atherosclerotic heart disease of native coronary artery without angina pectoris: Secondary | ICD-10-CM | POA: Insufficient documentation

## 2017-02-03 DIAGNOSIS — G20 Parkinson's disease: Secondary | ICD-10-CM | POA: Diagnosis not present

## 2017-02-03 DIAGNOSIS — T68XXXD Hypothermia, subsequent encounter: Secondary | ICD-10-CM | POA: Diagnosis not present

## 2017-02-03 DIAGNOSIS — Z79899 Other long term (current) drug therapy: Secondary | ICD-10-CM | POA: Diagnosis not present

## 2017-02-03 DIAGNOSIS — E039 Hypothyroidism, unspecified: Secondary | ICD-10-CM | POA: Diagnosis not present

## 2017-02-03 DIAGNOSIS — R531 Weakness: Secondary | ICD-10-CM

## 2017-02-03 DIAGNOSIS — M6281 Muscle weakness (generalized): Secondary | ICD-10-CM | POA: Diagnosis not present

## 2017-02-03 DIAGNOSIS — R269 Unspecified abnormalities of gait and mobility: Secondary | ICD-10-CM | POA: Diagnosis not present

## 2017-02-03 DIAGNOSIS — I10 Essential (primary) hypertension: Secondary | ICD-10-CM | POA: Insufficient documentation

## 2017-02-03 DIAGNOSIS — F0390 Unspecified dementia without behavioral disturbance: Secondary | ICD-10-CM | POA: Insufficient documentation

## 2017-02-03 DIAGNOSIS — L8990 Pressure ulcer of unspecified site, unspecified stage: Secondary | ICD-10-CM | POA: Insufficient documentation

## 2017-02-03 DIAGNOSIS — R4182 Altered mental status, unspecified: Secondary | ICD-10-CM | POA: Diagnosis present

## 2017-02-03 DIAGNOSIS — F0280 Dementia in other diseases classified elsewhere without behavioral disturbance: Secondary | ICD-10-CM

## 2017-02-03 DIAGNOSIS — R410 Disorientation, unspecified: Secondary | ICD-10-CM

## 2017-02-03 DIAGNOSIS — G252 Other specified forms of tremor: Secondary | ICD-10-CM | POA: Diagnosis not present

## 2017-02-03 DIAGNOSIS — E119 Type 2 diabetes mellitus without complications: Secondary | ICD-10-CM | POA: Diagnosis not present

## 2017-02-03 DIAGNOSIS — F99 Mental disorder, not otherwise specified: Secondary | ICD-10-CM | POA: Diagnosis not present

## 2017-02-03 DIAGNOSIS — R400 Somnolence: Secondary | ICD-10-CM

## 2017-02-03 LAB — COMPREHENSIVE METABOLIC PANEL
ALBUMIN: 3.8 g/dL (ref 3.5–5.0)
ALT: 7 U/L — ABNORMAL LOW (ref 14–54)
AST: 19 U/L (ref 15–41)
Alkaline Phosphatase: 54 U/L (ref 38–126)
Anion gap: 10 (ref 5–15)
BILIRUBIN TOTAL: 0.8 mg/dL (ref 0.3–1.2)
BUN: 14 mg/dL (ref 6–20)
CHLORIDE: 102 mmol/L (ref 101–111)
CO2: 27 mmol/L (ref 22–32)
Calcium: 9.1 mg/dL (ref 8.9–10.3)
Creatinine, Ser: 0.97 mg/dL (ref 0.44–1.00)
GFR calc Af Amer: 59 mL/min — ABNORMAL LOW (ref >60)
GFR calc non Af Amer: 51 mL/min — ABNORMAL LOW (ref >60)
GLUCOSE: 191 mg/dL — AB (ref 65–99)
POTASSIUM: 3.8 mmol/L (ref 3.5–5.1)
Sodium: 139 mmol/L (ref 135–145)
Total Protein: 6.3 g/dL — ABNORMAL LOW (ref 6.5–8.1)

## 2017-02-03 LAB — I-STAT TROPONIN, ED: Troponin i, poc: 0.01 ng/mL (ref 0.00–0.08)

## 2017-02-03 LAB — CBC WITH DIFFERENTIAL/PLATELET
Basophils Absolute: 0 10*3/uL (ref 0.0–0.1)
Basophils Relative: 1 %
Eosinophils Absolute: 0.1 10*3/uL (ref 0.0–0.7)
Eosinophils Relative: 2 %
HEMATOCRIT: 40.6 % (ref 36.0–46.0)
Hemoglobin: 13.7 g/dL (ref 12.0–15.0)
LYMPHS ABS: 1.9 10*3/uL (ref 0.7–4.0)
LYMPHS PCT: 44 %
MCH: 32.2 pg (ref 26.0–34.0)
MCHC: 33.7 g/dL (ref 30.0–36.0)
MCV: 95.5 fL (ref 78.0–100.0)
MONO ABS: 0.3 10*3/uL (ref 0.1–1.0)
MONOS PCT: 8 %
Neutro Abs: 2 10*3/uL (ref 1.7–7.7)
Neutrophils Relative %: 45 %
PLATELETS: 137 10*3/uL — AB (ref 150–400)
RBC: 4.25 MIL/uL (ref 3.87–5.11)
RDW: 13.3 % (ref 11.5–15.5)
WBC: 4.3 10*3/uL (ref 4.0–10.5)

## 2017-02-03 LAB — I-STAT CG4 LACTIC ACID, ED
LACTIC ACID, VENOUS: 0.54 mmol/L (ref 0.5–1.9)
Lactic Acid, Venous: 2 mmol/L (ref 0.5–1.9)

## 2017-02-03 LAB — HEMOGLOBIN A1C
Hgb A1c MFr Bld: 5.8 % — ABNORMAL HIGH (ref 4.8–5.6)
Mean Plasma Glucose: 119.76 mg/dL

## 2017-02-03 LAB — GLUCOSE, CAPILLARY: GLUCOSE-CAPILLARY: 141 mg/dL — AB (ref 65–99)

## 2017-02-03 LAB — MRSA PCR SCREENING: MRSA by PCR: NEGATIVE

## 2017-02-03 LAB — TROPONIN I

## 2017-02-03 LAB — CBG MONITORING, ED: Glucose-Capillary: 176 mg/dL — ABNORMAL HIGH (ref 65–99)

## 2017-02-03 MED ORDER — QUETIAPINE FUMARATE 25 MG PO TABS: 25.0000 mg | ORAL_TABLET | Freq: Two times a day (BID) | ORAL | Status: DC

## 2017-02-03 MED ORDER — INSULIN ASPART 100 UNIT/ML ~~LOC~~ SOLN: 0.0000 [IU] | Freq: Three times a day (TID) | SUBCUTANEOUS | Status: DC

## 2017-02-03 MED ORDER — ACETAMINOPHEN 160 MG/5ML PO SOLN: 650.0000 mg | ORAL | Status: DC | PRN

## 2017-02-03 MED ORDER — CARBOXYMETHYLCELLULOSE SODIUM 1 % OP SOLN
1.0000 [drp] | Freq: Three times a day (TID) | OPHTHALMIC | Status: DC
Start: 2017-02-03 — End: 2017-02-03

## 2017-02-03 MED ORDER — STROKE: EARLY STAGES OF RECOVERY BOOK
Freq: Once | Status: DC
  Filled 2017-02-03: qty 1

## 2017-02-03 MED ORDER — LORAZEPAM 0.5 MG PO TABS: 0.5000 mg | ORAL_TABLET | Freq: Two times a day (BID) | ORAL | Status: DC

## 2017-02-03 MED ORDER — SODIUM CHLORIDE 0.9 % IV BOLUS (SEPSIS)
1000.0000 mL | Freq: Once | INTRAVENOUS | Status: AC
  Administered 2017-02-03: 1000 mL via INTRAVENOUS

## 2017-02-03 MED ORDER — GUAIFENESIN-DM 100-10 MG/5ML PO SYRP: 15.0000 mL | ORAL_SOLUTION | ORAL | Status: DC | PRN

## 2017-02-03 MED ORDER — ENSURE ENLIVE PO LIQD
1.0000 | Freq: Two times a day (BID) | ORAL | Status: DC
  Administered 2017-02-03 – 2017-02-04 (×2): 237 mL via ORAL

## 2017-02-03 MED ORDER — LORAZEPAM 0.5 MG PO TABS: 0.5000 mg | ORAL_TABLET | Freq: Two times a day (BID) | ORAL | Status: DC | PRN

## 2017-02-03 MED ORDER — CARBIDOPA-LEVODOPA 25-100 MG PO TABS
0.5000 | ORAL_TABLET | Freq: Three times a day (TID) | ORAL | Status: DC
  Administered 2017-02-03 – 2017-02-04 (×3): 0.5 via ORAL
  Filled 2017-02-03: qty 1
  Filled 2017-02-03: qty 0.5
  Filled 2017-02-03 (×2): qty 1
  Filled 2017-02-03 (×2): qty 0.5

## 2017-02-03 MED ORDER — LORATADINE 10 MG PO TABS
10.0000 mg | ORAL_TABLET | Freq: Every day | ORAL | Status: DC
  Administered 2017-02-03 – 2017-02-04 (×2): 10 mg via ORAL
  Filled 2017-02-03 (×2): qty 1

## 2017-02-03 MED ORDER — ONDANSETRON HCL 4 MG PO TABS: 4.0000 mg | ORAL_TABLET | Freq: Four times a day (QID) | ORAL | Status: DC | PRN

## 2017-02-03 MED ORDER — ADULT MULTIVITAMIN W/MINERALS CH
1.0000 | ORAL_TABLET | Freq: Every day | ORAL | Status: DC
  Administered 2017-02-03 – 2017-02-04 (×2): 1 via ORAL
  Filled 2017-02-03 (×2): qty 1

## 2017-02-03 MED ORDER — DOCUSATE SODIUM 100 MG PO CAPS
100.0000 mg | ORAL_CAPSULE | Freq: Two times a day (BID) | ORAL | Status: DC
  Administered 2017-02-03 – 2017-02-04 (×2): 100 mg via ORAL
  Filled 2017-02-03 (×2): qty 1

## 2017-02-03 MED ORDER — ACETAMINOPHEN 325 MG PO TABS: 650.0000 mg | ORAL_TABLET | Freq: Four times a day (QID) | ORAL | Status: DC | PRN

## 2017-02-03 MED ORDER — QUETIAPINE FUMARATE 25 MG PO TABS
25.0000 mg | ORAL_TABLET | Freq: Every day | ORAL | Status: DC
  Administered 2017-02-04: 25 mg via ORAL
  Filled 2017-02-03: qty 1

## 2017-02-03 MED ORDER — AMANTADINE HCL 100 MG PO CAPS
100.0000 mg | ORAL_CAPSULE | Freq: Two times a day (BID) | ORAL | Status: DC
  Administered 2017-02-03 – 2017-02-04 (×2): 100 mg via ORAL
  Filled 2017-02-03 (×8): qty 1

## 2017-02-03 MED ORDER — TRAZODONE HCL 50 MG PO TABS
50.0000 mg | ORAL_TABLET | Freq: Every day | ORAL | Status: DC
  Administered 2017-02-03: 50 mg via ORAL
  Filled 2017-02-03: qty 1

## 2017-02-03 MED ORDER — QUETIAPINE FUMARATE 25 MG PO TABS
50.0000 mg | ORAL_TABLET | Freq: Every day | ORAL | Status: DC
  Administered 2017-02-03: 50 mg via ORAL
  Filled 2017-02-03: qty 2

## 2017-02-03 MED ORDER — POLYVINYL ALCOHOL 1.4 % OP SOLN
1.0000 [drp] | Freq: Three times a day (TID) | OPHTHALMIC | Status: DC
  Administered 2017-02-03 – 2017-02-04 (×2): 1 [drp] via OPHTHALMIC
  Filled 2017-02-03: qty 15

## 2017-02-03 MED ORDER — ACETAMINOPHEN 650 MG RE SUPP: 650.0000 mg | RECTAL | Status: DC | PRN

## 2017-02-03 MED ORDER — SODIUM CHLORIDE 0.9 % IV SOLN
INTRAVENOUS | Status: DC
  Administered 2017-02-03 – 2017-02-04 (×2): via INTRAVENOUS

## 2017-02-03 MED ORDER — ENOXAPARIN SODIUM 40 MG/0.4ML ~~LOC~~ SOLN
40.0000 mg | SUBCUTANEOUS | Status: DC
  Administered 2017-02-03: 40 mg via SUBCUTANEOUS
  Filled 2017-02-03: qty 0.4

## 2017-02-03 MED ORDER — SENNA 8.6 MG PO TABS
1.0000 | ORAL_TABLET | Freq: Every day | ORAL | Status: DC
  Administered 2017-02-03: 8.6 mg via ORAL
  Filled 2017-02-03: qty 1

## 2017-02-03 MED ORDER — POLYETHYLENE GLYCOL 3350 17 G PO PACK: 17.0000 g | PACK | Freq: Every day | ORAL | Status: DC | PRN

## 2017-02-03 MED ORDER — ACETAMINOPHEN 325 MG PO TABS: 650.0000 mg | ORAL_TABLET | ORAL | Status: DC | PRN

## 2017-02-03 NOTE — ED Provider Notes (Addendum)
[place] DEPT Provider Note   CSN: [id number] Arrival date & time: 02/03/17  1114     History   Chief Complaint Chief Complaint  Patient presents with  . Altered Mental Status    HPI Catherine [name] is a 83 y.o. female.  HPI Level 5 caveat due to AMS. History of dementia with parkinson's disease, HTN, HLD, and DM. Presents from [place] with [place]. History provided by patient's aid at [facility]. She has been in her usual state of health. At baseline when she saw her at 8 AM. Around lunch time, was eating a peanut butter pie when she slumped over. Her aid reports that her eyes were rolled back, and she could not get her to wake. She notified staff who called EMS. Gradually more responsive to stimuli en route. On arrival, somnolent but obeys simple commands and answer simple questions.   Past Medical History:  Diagnosis Date  . Arthritis   . Cancer ([place])    basal cell-forehead  . Dementia in Parkinson's disease ([place]) 12/25/2015  . Diabetes mellitus without complication ([place])   . Dyslipidemia   . Hypertension   . Migraine without aura, without mention of intractable migraine without mention of status migrainosus   . Obese   . Paralysis agitans ([place]) 09/20/2012  . Parkinson's disease ([place])   . Sciatica of left side   . Tremor     Patient Active Problem List   Diagnosis Date Noted  . Confusion and disorientation 02/03/2017  . Gait abnormality 07/15/2016  . Generalized weakness 04/04/2016  . General weakness 04/04/2016  . CAD (coronary artery disease) 04/04/2016  . Pressure injury of skin 04/04/2016  . UTI (urinary tract infection) 01/04/2016  . Acute encephalopathy 01/02/2016  . NSTEMI (non-ST elevated myocardial infarction) ([place]) 01/02/2016  . HTN (hypertension) 01/02/2016  . Pressure ulcer 01/02/2016  . Altered mental status 01/01/2016  . Dementia in Parkinson's disease ([place]) 12/25/2015  . Parkinson's disease ([place]) 05/24/2015  . Parkinsonian tremor ([place])  05/24/2015  . Left displaced femoral neck fracture ([place]) 05/23/2015  . Closed left hip fracture ([place])   . Hip fracture ([place]) 05/22/2015  . Weakness 12/22/2014  . Ataxia 12/22/2014  . PAT (paroxysmal atrial tachycardia) ([place]) 01/25/2013  . Palpitations 01/11/2013  . Paralysis agitans ([place]) 09/20/2012  . Type 2 diabetes mellitus ([place]) 07/21/2011  . Hyperlipidemia 07/21/2011    Past Surgical History:  Procedure Laterality Date  . APPENDECTOMY    . Arthroscopic surgery     Left knee  . basal cell carcinoma resection     Forehead  . CATARACT EXTRACTION W/PHACO Right 11/19/2013   Procedure: CATARACT EXTRACTION PHACO AND INTRAOCULAR LENS PLACEMENT (IOC);  Surgeon: Tonny Branch, MD;  Location: [facility];  Service: Ophthalmology;  Laterality: Right;  CDE:  12.95  . CATARACT EXTRACTION W/PHACO Left 12/17/2013   Procedure: CATARACT EXTRACTION PHACO AND INTRAOCULAR LENS PLACEMENT (IOC);  Surgeon: Tonny Branch, MD;  Location: [facility];  Service: Ophthalmology;  Laterality: Left;  CDE 12.10  . HIP PINNING,CANNULATED Left 05/23/2015   Procedure: INTERNAL FIXATION LEFT HIP;  Surgeon: Carole Civil, MD;  Location: [facility];  Service: Orthopedics;  Laterality: Left;  . MOUTH SURGERY    . TONSILLECTOMY    . trigger finger surgery     Bilateral thumb    OB History    No data available       Home Medications    Prior to Admission medications   Medication Sig Start Date End Date Taking? Authorizing Provider  acetaminophen (TYLENOL) 325 MG tablet Take 650 mg by mouth every 6 (six) hours as needed.   Yes [provider]  amantadine (SYMMETREL) 100 MG capsule Take 1 capsule (100 mg total) by mouth 2 (two) times daily. 01/06/16  Yes Kathie Dike, MD  carbidopa-levodopa (SINEMET IR) 25-100 MG tablet Take 0.5 tablets by mouth 3 (three) times daily. 01/20/17  Yes Dennie Bible, NP  carboxymethylcellulose 1 % ophthalmic solution 1 drop 3 (three) times daily.   Yes [provider]    cetirizine (ZYRTEC) 10 MG tablet Take 10 mg by mouth daily.   Yes [provider]  docusate sodium (COLACE) 100 MG capsule Take 1 capsule (100 mg total) by mouth 2 (two) times daily. 05/25/15  Yes Annita Brod, MD  ENSURE PLUS (ENSURE PLUS) LIQD Take 1 Can by mouth 2 (two) times daily between meals.   Yes [provider]  guaiFENesin-dextromethorphan (ROBITUSSIN DM) 100-10 MG/5ML syrup Take 15 mLs by mouth every 4 (four) hours as needed for cough.   Yes [provider]  LORazepam (ATIVAN) 0.5 MG tablet Take 0.5 mg by mouth 2 (two) times daily.   Yes [provider]  mineral oil liquid Place into both ears every Monday. Instill 2 drops in both ears once daily every Monday for Cerumen Impaction.   Yes [provider]  Multiple Vitamin (MULTIVITAMIN) capsule Take 1 capsule by mouth daily.   Yes [provider]  ondansetron (ZOFRAN) 4 MG tablet Take 4 mg by mouth every 6 (six) hours as needed for nausea or vomiting.    Yes [provider]  polyethylene glycol (MIRALAX / GLYCOLAX) packet Take 17 g by mouth daily as needed for mild constipation (pt takes mon wed and friday).    Yes [provider]  QUEtiapine (SEROQUEL) 50 MG tablet 25 mg.  07/01/16  Yes [provider]  senna (SENOKOT) 8.6 MG TABS tablet Take 1 tablet by mouth at bedtime.   Yes [provider]  traZODone (DESYREL) 50 MG tablet Take 50 mg by mouth at bedtime.   Yes [provider]  white petrolatum (VASELINE) GEL Apply 1 application topically daily. Applied to bottom, peri area, and thighs topically for rash   Yes [provider]    Family History Family History  Problem Relation Age of Onset  . Rheum arthritis Mother   . Heart attack Father        not certain of COD  . Hypertension Paternal Grandfather   . Stroke Paternal Grandfather   . Cirrhosis Paternal Grandmother     Social History Social History  Substance  Use Topics  . Smoking status: Never Smoker  . Smokeless tobacco: Never Used  . Alcohol use No     Allergies   Sulfa antibiotics; Azilect [rasagiline]; Fenofibrate; Sinemet [carbidopa-levodopa]; Tape; and Tomato   Review of Systems Review of Systems  Unable to perform ROS: Mental status change     Physical Exam Updated Vital Signs BP 130/61   Pulse 73   Temp (!) 95.9 F (35.5 C) (Rectal)   Resp 12   Ht 5\' 3"  (1.6 m)   Wt 55.3 kg (122 lb)   SpO2 98%   BMI 21.61 kg/m   Physical Exam Physical Exam  Nursing note and vitals reviewed. Constitutional: Somnolent, minimally arouses to voice and touch, non-toxic, and in no acute distress Head: Normocephalic and atraumatic.  Mouth/Throat: Oropharynx is clear and dry mucous membranes.  Neck: Normal range of motion. Neck  supple.  Cardiovascular: Normal rate and regular rhythm.   Pulmonary/Chest: Effort normal and breath sounds normal.  Abdominal: Soft. There is no tenderness. There is no rebound and no guarding.  Musculoskeletal: No deformities.  Neurological: somnolent, answers simple question, obey simple commands, no facial droop, moves all extremities symmetrically to command, equal bilateral hand grip, PERRL Skin: Skin is warm and dry.  Psychiatric: Cooperative   ED Treatments / Results  Labs (all labs ordered are listed, but only abnormal results are displayed) Labs Reviewed  CBC WITH DIFFERENTIAL/PLATELET - Abnormal; Notable for the following:       Result Value   Platelets 137 (*)    All other components within normal limits  COMPREHENSIVE METABOLIC PANEL - Abnormal; Notable for the following:    Glucose, Bld 191 (*)    Total Protein 6.3 (*)    ALT 7 (*)    GFR calc non Af Amer 51 (*)    GFR calc Af Amer 59 (*)    All other components within normal limits  CBG MONITORING, ED - Abnormal; Notable for the following:    Glucose-Capillary 176 (*)    All other components within normal limits  I-STAT CG4 LACTIC  ACID, ED - Abnormal; Notable for the following:    Lactic Acid, Venous 2.00 (*)    All other components within normal limits  URINALYSIS, ROUTINE W REFLEX MICROSCOPIC  I-STAT TROPONIN, ED    EKG  EKG Interpretation  Date/Time:  Thursday February 03 2017 11:20:38 EDT Ventricular Rate:  71 PR Interval:    QRS Duration: 88 QT Interval:  412 QTC Calculation: 448 R Axis:   -22 Text Interpretation:  Sinus rhythm Borderline left axis deviation Low voltage, extremity and precordial leads no acute changes  Confirmed by Brantley Stage [id number]) on 02/03/2017 12:54:58 PM       Radiology Dg Chest 1 View  Result Date: 02/03/2017 CLINICAL DATA:  Acute mental status changes. Current history of diabetes and hypertension. EXAM: CHEST 1 VIEW COMPARISON:  08/15/2016, 01/03/2016 and earlier. FINDINGS: Cardiac silhouette normal in size, unchanged. Thoracic aorta atherosclerotic, unchanged. Hilar and mediastinal contours otherwise unremarkable. Suboptimal inspiration accounts for atelectasis at the lung bases, left greater than right. Stable calcified granuloma at the right lung base. Lungs otherwise clear. Pulmonary vascularity normal. No visible pleural effusions. IMPRESSION: 1. Suboptimal inspiration accounts for mild bibasilar atelectasis, left greater than right. No acute cardiopulmonary disease otherwise. 2.  Aortic Atherosclerosis (ICD10-170.0) Electronically Signed   By: Evangeline Dakin M.D.   On: 02/03/2017 12:34   Ct Head Wo Contrast  Result Date: 02/03/2017 CLINICAL DATA:  Altered mental status, history Parkinson's, type II diabetes mellitus, hypertension, migraines EXAM: CT HEAD WITHOUT CONTRAST TECHNIQUE: Contiguous axial images were obtained from the base of the skull through the vertex without intravenous contrast. COMPARISON:  04/04/2016 ; correlation interval MR brain 04/05/2016 FINDINGS: Brain: Generalized atrophy. Normal ventricular morphology. No midline shift or mass effect. Small vessel  chronic ischemic changes of deep cerebral white matter. Questionable tiny old lacunar infarct at anterior limb of RIGHT internal capsule versus mild white matter disease. No intracranial hemorrhage, mass lesion, evidence of acute infarction, or extra-axial fluid collection. Vascular: Atherosclerotic calcification of internal carotid and vertebral arteries at skullbase Skull: Intact Sinuses/Orbits: Clear Other: N/A IMPRESSION: Atrophy with small vessel chronic ischemic changes of deep cerebral white matter. Questionable tiny old lacunar infarct at anterior limb RIGHT internal capsule versus mild white matter disease, new since 2017. No acute intracranial abnormalities. Electronically Signed  By: Lavonia Larae Caison M.D.   On: 02/03/2017 12:54    Procedures Procedures (including critical care time)  Medications Ordered in ED Medications  sodium chloride 0.9 % bolus 1,000 mL (0 mLs Intravenous Stopped 02/03/17 1239)     Initial Impression / Assessment and Plan / ED Course  I have reviewed the triage vital signs and the nursing notes.  Pertinent labs & imaging results that were available during my care of the patient were reviewed by me and considered in my medical decision making (see chart for details).     83 year old female who presents with episode of unresponsiveness and altered mental status. Over the course of several hours, and during observation in ED, she has become more alert, responsive, talkative. Family at bedside states that she is close to her baseline mental status at this point. She has no obvious focal neurological deficits. She is mildly hypothermic 95.9 rectally, and warming blankets are placed on her.   No other signs of obvious infection at this time. Catheterized urinalysis is pending. Chest x-ray shows no obvious pneumonia. CT head shows no acute intracranial processes but chronic ischemic changes. Blood work without electrolyte derangements, no significant acute kidney injury, but  she does appear dehydrated on exam, and with elevated lactate at 2.0. Given IV fluids. EKG w/o stigmata of arrhythmia or ischemia.  Question syncope, less likely seizure.  Discussed with Dr. Wynetta Emery who will admit for observation.   Final Clinical Impressions(s) / ED Diagnoses   Final diagnoses:  Altered mental status, unspecified altered mental status type  Somnolence  Unresponsiveness  Syncope and collapse    New Prescriptions New Prescriptions   No medications on file     Forde Dandy, MD 02/03/17 1357    Forde Dandy, MD 02/03/17 [phone]

## 2017-02-03 NOTE — Progress Notes (Signed)
02/03/2017 5:35 PM  MRI reviewed, no acute CVA seen..discontinued stroke protocol.  [place] [employer]

## 2017-02-03 NOTE — ED Triage Notes (Signed)
Pt coming from [place]. [facility] states pt was sitting in wheelchair when she became unresponsive. Pt.'s eyes rolled in the back of her head. Now is responsive to painful stimuli.   CBG 192 VSS On Non rebreather and O2 sats 98%

## 2017-02-03 NOTE — ED Notes (Signed)
Have applied Pure Wick to retrieve

## 2017-02-03 NOTE — ED Notes (Signed)
In and out catheter attempted x2. No urine returned. White cloudy discharge noted on catheter upon removal. Bladder scan performed, 0-83ml of urine in bladder. Purewick external female catheter applied.

## 2017-02-03 NOTE — H&P (Signed)
History and Physical  Catherine [name] ATF:[id number] DOB: [birth year] DOA: 02/03/2017  Referring physician: Oleta Mouse PCP: Renata Caprice, DO   Chief Complaint: AMS  HPI: Catherine [name] is a 83 y.o. female from [place] presented to ED after having a brief episode of AMS.  She has known parkinson's disease with associated dementia, she is not ambulatory and she has been her usual state of health until earlier this morning.  She was noted around lunch time to have an episode where she became altered slumped over and eyes rolled back in head and was difficult to arouse.  She has cerebrovascular disease that is known.  She was not noted to have any seizure activity.  She was observed by her 24/7 caretaker and EMS was called.  She gradually became more responsive en route to the ED.  She was noted in ED to have returned to her baseline per family.  The ED provider requested that she be observed in the hospital overnight.  Urinalysis is still pending.   Severity of Illness: The appropriate patient status for this patient is OBSERVATION. Observation status is judged to be reasonable and necessary in order to provide the required intensity of service to ensure the patient's safety. The patient's presenting symptoms, physical exam findings, and initial radiographic and laboratory data in the context of their medical condition is felt to place them at decreased risk for further clinical deterioration. Furthermore, it is anticipated that the patient will be medically stable for discharge from the hospital within 2 midnights of admission. The following factors support the patient status of observation.   " The patient's presenting symptoms include altered mentation. " The physical exam findings include no acute focal neurological deficits, mild dehydration. " The initial radiographic and laboratory data are mild elevated lactic acid, hypothermia.  Review of Systems: UTO due to dementia  Past Medical History:    Diagnosis Date  . Arthritis   . Cancer ([place])    basal cell-forehead  . Dementia in Parkinson's disease ([place]) 12/25/2015  . Diabetes mellitus without complication ([place])   . Dyslipidemia   . Hypertension   . Migraine without aura, without mention of intractable migraine without mention of status migrainosus   . Obese   . Paralysis agitans ([place]) 09/20/2012  . Parkinson's disease ([place])   . Sciatica of left side   . Tremor    Past Surgical History:  Procedure Laterality Date  . APPENDECTOMY    . Arthroscopic surgery     Left knee  . basal cell carcinoma resection     Forehead  . CATARACT EXTRACTION W/PHACO Right 11/19/2013   Procedure: CATARACT EXTRACTION PHACO AND INTRAOCULAR LENS PLACEMENT (IOC);  Surgeon: Tonny Branch, MD;  Location: [facility];  Service: Ophthalmology;  Laterality: Right;  CDE:  12.95  . CATARACT EXTRACTION W/PHACO Left 12/17/2013   Procedure: CATARACT EXTRACTION PHACO AND INTRAOCULAR LENS PLACEMENT (IOC);  Surgeon: Tonny Branch, MD;  Location: [facility];  Service: Ophthalmology;  Laterality: Left;  CDE 12.10  . HIP PINNING,CANNULATED Left 05/23/2015   Procedure: INTERNAL FIXATION LEFT HIP;  Surgeon: Carole Civil, MD;  Location: [facility];  Service: Orthopedics;  Laterality: Left;  . MOUTH SURGERY    . TONSILLECTOMY    . trigger finger surgery     Bilateral thumb   Social History:  reports that she has never smoked. She has never used smokeless tobacco. She reports that she does not drink alcohol or use drugs.  Allergies  Allergen Reactions  .  Sulfa Antibiotics Rash  . Azilect [Rasagiline] Rash  . Fenofibrate Rash  . Sinemet [Carbidopa-Levodopa] Rash  . Tape Rash and Other (See Comments)    Redness  . Tomato Rash    Pt can eat raw tomatoes.Marland Kitchenate tomato sandwich a few days ago    Family History  Problem Relation Age of Onset  . Rheum arthritis Mother   . Heart attack Father        not certain of COD  . Hypertension Paternal Grandfather   . Stroke Paternal  Grandfather   . Cirrhosis Paternal Grandmother     Prior to Admission medications   Medication Sig Start Date End Date Taking? Authorizing Provider  acetaminophen (TYLENOL) 325 MG tablet Take 650 mg by mouth every 6 (six) hours as needed.   Yes [provider]  amantadine (SYMMETREL) 100 MG capsule Take 1 capsule (100 mg total) by mouth 2 (two) times daily. 01/06/16  Yes Kathie Dike, MD  carbidopa-levodopa (SINEMET IR) 25-100 MG tablet Take 0.5 tablets by mouth 3 (three) times daily. 01/20/17  Yes Dennie Bible, NP  carboxymethylcellulose 1 % ophthalmic solution 1 drop 3 (three) times daily.   Yes [provider]  cetirizine (ZYRTEC) 10 MG tablet Take 10 mg by mouth daily.   Yes [provider]  docusate sodium (COLACE) 100 MG capsule Take 1 capsule (100 mg total) by mouth 2 (two) times daily. 05/25/15  Yes Annita Brod, MD  ENSURE PLUS (ENSURE PLUS) LIQD Take 1 Can by mouth 2 (two) times daily between meals.   Yes [provider]  guaiFENesin-dextromethorphan (ROBITUSSIN DM) 100-10 MG/5ML syrup Take 15 mLs by mouth every 4 (four) hours as needed for cough.   Yes [provider]  LORazepam (ATIVAN) 0.5 MG tablet Take 0.5 mg by mouth 2 (two) times daily.   Yes [provider]  mineral oil liquid Place into both ears every Monday. Instill 2 drops in both ears once daily every Monday for Cerumen Impaction.   Yes [provider]  Multiple Vitamin (MULTIVITAMIN) capsule Take 1 capsule by mouth daily.   Yes [provider]  ondansetron (ZOFRAN) 4 MG tablet Take 4 mg by mouth every 6 (six) hours as needed for nausea or vomiting.    Yes [provider]  polyethylene glycol (MIRALAX / GLYCOLAX) packet Take 17 g by mouth daily as needed for mild constipation (pt takes mon wed and friday).    Yes [provider]  QUEtiapine (SEROQUEL) 50 MG tablet 25 mg.  07/01/16  Yes [provider]  senna  (SENOKOT) 8.6 MG TABS tablet Take 1 tablet by mouth at bedtime.   Yes [provider]  traZODone (DESYREL) 50 MG tablet Take 50 mg by mouth at bedtime.   Yes [provider]  white petrolatum (VASELINE) GEL Apply 1 application topically daily. Applied to bottom, peri area, and thighs topically for rash   Yes [provider]   Physical Exam: Vitals:   02/03/17 1200 02/03/17 1230 02/03/17 1302 02/03/17 1330  BP: 113/60 (!) 138/57 (!) 138/57 130/61  Pulse: 62 70 71 73  Resp: 17 16 18 12   Temp:      TempSrc:      SpO2: 93% 95% 98% 98%  Weight:      Height:         General exam: elderly female, resting tremor noted, covered in blankets, lying comfortably supine on the gurney in no obvious distress. Able to answer questions appropriately.  Head, eyes and ENT: Nontraumatic and normocephalic. Pupils equally reacting to light and accommodation. Oral mucosa dry.  Neck: Supple. No JVD, carotid bruit or thyromegaly.  Lymphatics: No lymphadenopathy.  Respiratory system: Clear to auscultation. No increased work of breathing.  Cardiovascular system: S1 and S2 heard, RRR. No JVD, murmurs, gallops, clicks or pedal edema.  Gastrointestinal system: Abdomen is nondistended, soft and nontender. Normal bowel sounds heard. No organomegaly or masses appreciated.  Central nervous system: Alert and oriented. Resting tremor noted.   Extremities: no pretibial edema.    Skin: No rashes or acute findings.  Musculoskeletal system: Negative exam.  Psychiatry: Pleasant and cooperative.  Labs on Admission:  Basic Metabolic Panel:  Recent Labs Lab 02/03/17 1138  NA 139  K 3.8  CL 102  CO2 27  GLUCOSE 191*  BUN 14  CREATININE 0.97  CALCIUM 9.1   Liver Function Tests:  Recent Labs Lab 02/03/17 1138  AST 19  ALT 7*  ALKPHOS 54  BILITOT 0.8  PROT 6.3*  ALBUMIN 3.8   No results for input(s): LIPASE, AMYLASE in the last 168 hours. No results for input(s):  AMMONIA in the last 168 hours. CBC:  Recent Labs Lab 02/03/17 1138  WBC 4.3  NEUTROABS 2.0  HGB 13.7  HCT 40.6  MCV 95.5  PLT 137*   Cardiac Enzymes: No results for input(s): CKTOTAL, CKMB, CKMBINDEX, TROPONINI in the last 168 hours.  BNP (last 3 results) No results for input(s): PROBNP in the last 8760 hours. CBG:  Recent Labs Lab 02/03/17 1147  GLUCAP 176*    Radiological Exams on Admission: Dg Chest 1 View  Result Date: 02/03/2017 CLINICAL DATA:  Acute mental status changes. Current history of diabetes and hypertension. EXAM: CHEST 1 VIEW COMPARISON:  08/15/2016, 01/03/2016 and earlier. FINDINGS: Cardiac silhouette normal in size, unchanged. Thoracic aorta atherosclerotic, unchanged. Hilar and mediastinal contours otherwise unremarkable. Suboptimal inspiration accounts for atelectasis at the lung bases, left greater than right. Stable calcified granuloma at the right lung base. Lungs otherwise clear. Pulmonary vascularity normal. No visible pleural effusions. IMPRESSION: 1. Suboptimal inspiration accounts for mild bibasilar atelectasis, left greater than right. No acute cardiopulmonary disease otherwise. 2.  Aortic Atherosclerosis (ICD10-170.0) Electronically Signed   By: Evangeline Dakin M.D.   On: 02/03/2017 12:34   Ct Head Wo Contrast  Result Date: 02/03/2017 CLINICAL DATA:  Altered mental status, history Parkinson's, type II diabetes mellitus, hypertension, migraines EXAM: CT HEAD WITHOUT CONTRAST TECHNIQUE: Contiguous axial images were obtained from the base of the skull through the vertex without intravenous contrast. COMPARISON:  04/04/2016 ; correlation interval MR brain 04/05/2016 FINDINGS: Brain: Generalized atrophy. Normal ventricular morphology. No midline shift or mass effect. Small vessel chronic ischemic changes of deep cerebral white matter. Questionable tiny old lacunar infarct at anterior limb of RIGHT internal capsule versus mild white matter disease. No  intracranial hemorrhage, mass lesion, evidence of acute infarction, or extra-axial fluid collection. Vascular: Atherosclerotic calcification of internal carotid and vertebral arteries at skullbase Skull: Intact Sinuses/Orbits: Clear Other: N/A IMPRESSION: Atrophy with small vessel chronic ischemic changes of deep cerebral white matter. Questionable tiny old lacunar infarct at anterior limb RIGHT internal capsule versus mild white matter disease, new since 2017. No acute intracranial abnormalities. Electronically Signed   By: Lavonia Dana M.D.   On: 02/03/2017 12:54    EKG: Independently reviewed.   Assessment/Plan Principal Problem:   Confusion and disorientation Active Problems:   Paralysis agitans ([facility])   Weakness   Parkinson's disease ([place])  Parkinsonian tremor ([facility])   Dementia in Parkinson's disease ([place])   Altered mental status   Generalized weakness   Gait abnormality  1. Acute change in mentation - long differential for this given her advanced age and comorbidities however I'm concerned about TIA given that her symptoms have resolved and she is back to her baseline mental status. Check an MRI brain without contrast to rule out TIA. Neuro checks ordered.  Pt does not appear post-ictal.  Check Urinalysis to rule out infection.   2. Generalized weakness - this has been a progressive problem that has continued to worsen as her Parkinson medication has been reduced by her neurology team they had to do that because she was having increasing episodes of confusion and disorientation with higher doses of Sinemet. Unfortunately her weakness has increased as the doses have been reduced. She recently saw her neurologist about this and they recommended continuing the same dose of Sinemet. PT evaluation has been ordered however I'm not certain that they can help her at this point. Her Parkinson's has progressed to the point where she is not ambulatory anymore. She had been able to get into a wheelchair a  couple times a day. 3. Parkinson's Disease - as above will continue Sinemet at same dose. 4. Dementia Parkinson's disease-resume home medications. 5. Hypothermia-warm blankets have been provided. Infection workup in process. Awaiting urinalysis results. Chest x-ray no acute findings. No leukocytosis. Mild lactate elevation thought to be secondary to dehydration.  DVT Prophylaxis: lovenox Code Status: DNR  Family Communication: 24/7 caretaker at bedside  Disposition Plan: return to [facility] 9/28   Time spent: 60 mins  Irwin Brakeman, MD [facility] Pager [phone]  If 7PM-7AM, please contact night-coverage [email or web address] Password [email or web address] 02/03/2017, 2:01 PM

## 2017-02-03 NOTE — ED Notes (Signed)
Pt to MRI

## 2017-02-04 ENCOUNTER — Observation Stay (HOSPITAL_BASED_OUTPATIENT_CLINIC_OR_DEPARTMENT_OTHER): Payer: Medicare Other

## 2017-02-04 DIAGNOSIS — G20 Parkinson's disease: Secondary | ICD-10-CM | POA: Diagnosis not present

## 2017-02-04 DIAGNOSIS — F99 Mental disorder, not otherwise specified: Secondary | ICD-10-CM | POA: Diagnosis not present

## 2017-02-04 DIAGNOSIS — R4182 Altered mental status, unspecified: Secondary | ICD-10-CM | POA: Diagnosis not present

## 2017-02-04 DIAGNOSIS — R269 Unspecified abnormalities of gait and mobility: Secondary | ICD-10-CM | POA: Diagnosis not present

## 2017-02-04 DIAGNOSIS — G459 Transient cerebral ischemic attack, unspecified: Secondary | ICD-10-CM

## 2017-02-04 LAB — URINALYSIS, ROUTINE W REFLEX MICROSCOPIC
BILIRUBIN URINE: NEGATIVE
Glucose, UA: NEGATIVE mg/dL
HGB URINE DIPSTICK: NEGATIVE
KETONES UR: NEGATIVE mg/dL
Leukocytes, UA: NEGATIVE
Nitrite: NEGATIVE
PROTEIN: NEGATIVE mg/dL
Specific Gravity, Urine: 1.011 (ref 1.005–1.030)
pH: 7 (ref 5.0–8.0)

## 2017-02-04 LAB — COMPREHENSIVE METABOLIC PANEL
ALT: 5 U/L — ABNORMAL LOW (ref 14–54)
AST: 15 U/L (ref 15–41)
Albumin: 3.4 g/dL — ABNORMAL LOW (ref 3.5–5.0)
Alkaline Phosphatase: 48 U/L (ref 38–126)
Anion gap: 7 (ref 5–15)
BILIRUBIN TOTAL: 1 mg/dL (ref 0.3–1.2)
BUN: 15 mg/dL (ref 6–20)
CO2: 26 mmol/L (ref 22–32)
CREATININE: 0.86 mg/dL (ref 0.44–1.00)
Calcium: 8.6 mg/dL — ABNORMAL LOW (ref 8.9–10.3)
Chloride: 108 mmol/L (ref 101–111)
GFR calc Af Amer: >60 mL/min (ref >60)
GFR, EST NON AFRICAN AMERICAN: 59 mL/min — AB (ref >60)
Glucose, Bld: 95 mg/dL (ref 65–99)
POTASSIUM: 3.8 mmol/L (ref 3.5–5.1)
Sodium: 141 mmol/L (ref 135–145)
TOTAL PROTEIN: 5.6 g/dL — AB (ref 6.5–8.1)

## 2017-02-04 LAB — RAPID URINE DRUG SCREEN, HOSP PERFORMED
Amphetamines: NOT DETECTED
BENZODIAZEPINES: NOT DETECTED
Barbiturates: NOT DETECTED
COCAINE: NOT DETECTED
OPIATES: NOT DETECTED
Tetrahydrocannabinol: NOT DETECTED

## 2017-02-04 LAB — ECHOCARDIOGRAM COMPLETE
HEIGHTINCHES: 64 in
Weight: 1954.16 oz

## 2017-02-04 LAB — CBC
HEMATOCRIT: 37.4 % (ref 36.0–46.0)
Hemoglobin: 12.5 g/dL (ref 12.0–15.0)
MCH: 31.8 pg (ref 26.0–34.0)
MCHC: 33.4 g/dL (ref 30.0–36.0)
MCV: 95.2 fL (ref 78.0–100.0)
Platelets: 148 10*3/uL — ABNORMAL LOW (ref 150–400)
RBC: 3.93 MIL/uL (ref 3.87–5.11)
RDW: 13.1 % (ref 11.5–15.5)
WBC: 5.2 10*3/uL (ref 4.0–10.5)

## 2017-02-04 LAB — GLUCOSE, CAPILLARY
GLUCOSE-CAPILLARY: 89 mg/dL (ref 65–99)
Glucose-Capillary: 84 mg/dL (ref 65–99)

## 2017-02-04 MED ORDER — LORAZEPAM 0.5 MG PO TABS: 0.5000 mg | ORAL_TABLET | Freq: Two times a day (BID) | ORAL | 0 refills | Status: DC

## 2017-02-04 NOTE — Discharge Summary (Signed)
Physician Discharge Summary  Catherine [name] LTJ:[id number] DOB: 15-Nov-1927 DOA: 02/03/2017  PCP: Renata Caprice, DO  Admit date: 02/03/2017 Discharge date: 02/04/2017  Admitted From: ALF  Disposition: ALF  Recommendations for Outpatient Follow-up:  1. Follow up with PCP in 1 weeks 2. Follow up with neurology [facility]) in 1-2 weeks 3. Please obtain BMP/CBC in one week  Discharge Condition: STABLE   CODE STATUS: DNR   Brief Hospitalization Summary: Please see all hospital notes, images, labs for full details of the hospitalization.  HPI: Catherine [name] is a 83 y.o. female from [place] presented to ED after having a brief episode of AMS.  She has known parkinson's disease with associated dementia, she is not ambulatory and she has been her usual state of health until earlier this morning.  She was noted around lunch time to have an episode where she became altered slumped over and eyes rolled back in head and was difficult to arouse.  She has cerebrovascular disease that is known.  She was not noted to have any seizure activity.  She was observed by her 24/7 caretaker and EMS was called.  She gradually became more responsive en route to the ED.  She was noted in ED to have returned to her baseline per family.  The ED provider requested that she be observed in the hospital overnight.   1. Acute change in mentation - long differential for this given her advanced age and comorbidities.  She had a CT and MRI that did not reveal any acute findings.  Pt did not appear post-ictal.   Urinalysis negative for infection.  Pt was observed and monitored in hospital and no recurrence of symptoms. Carotid dopplers no sig findings. UDS negative.  2. Generalized weakness - this has been a progressive problem that has continued to worsen as her Parkinson medication has been reduced by her neurology team they had to do that because she was having increasing episodes of confusion and disorientation with  higher doses of Sinemet. Unfortunately her weakness has increased as the doses have been reduced. She recently saw her neurologist about this and they recommended continuing the same dose of Sinemet. Her Parkinson's has progressed to the point where she is not ambulatory anymore. She had been able to get into a wheelchair a couple times a day.  Follow up with neurology.  3. Parkinson's Disease - as above will continue Sinemet at same dose. 4. Dementia Parkinson's disease-resume home medications.  Follow up with [facility].  5. Hypothermia - Resolved now.  Urinalysis negative.  Chest x-ray no acute findings. No leukocytosis. Mild lactate elevation thought to be secondary to dehydration.  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[facility])   Dementia in Parkinson's disease ([place])   Altered mental status   Pressure ulcer   Generalized weakness   Gait abnormality   Hypothyroidism  Discharge Instructions: Discharge Instructions    Increase activity slowly    Complete by:  As directed      Allergies as of 02/04/2017      Reactions   Sulfa Antibiotics Rash   Azilect [rasagiline] Rash   Fenofibrate Rash   Sinemet [carbidopa-levodopa] Rash   Tape Rash, Other (See Comments)   Redness  Tomato Rash   Pt can eat raw tomatoes.Marland Kitchenate tomato sandwich a few days ago      Medication List    TAKE these medications   acetaminophen 325 MG tablet Commonly known as:  TYLENOL Take 650 mg by mouth every 6 (six) hours as needed.   amantadine 100 MG capsule Commonly known as:  SYMMETREL Take 1 capsule (100 mg total) by mouth 2 (two) times daily.   carbidopa-levodopa 25-100 MG tablet Commonly known as:  SINEMET IR Take 0.5  tablets by mouth 3 (three) times daily.   carboxymethylcellulose 1 % ophthalmic solution 1 drop 3 (three) times daily.   cetirizine 10 MG tablet Commonly known as:  ZYRTEC Take 10 mg by mouth daily.   docusate sodium 100 MG capsule Commonly known as:  COLACE Take 1 capsule (100 mg total) by mouth 2 (two) times daily.   ENSURE PLUS Liqd Take 1 Can by mouth 2 (two) times daily between meals.   guaiFENesin-dextromethorphan 100-10 MG/5ML syrup Commonly known as:  ROBITUSSIN DM Take 15 mLs by mouth every 4 (four) hours as needed for cough.   LORazepam 0.5 MG tablet Commonly known as:  ATIVAN Take 1 tablet (0.5 mg total) by mouth 2 (two) times daily.   mineral oil liquid Place into both ears every Monday. Instill 2 drops in both ears once daily every Monday for Cerumen Impaction.   multivitamin capsule Take 1 capsule by mouth daily.   ondansetron 4 MG tablet Commonly known as:  ZOFRAN Take 4 mg by mouth every 6 (six) hours as needed for nausea or vomiting.   polyethylene glycol packet Commonly known as:  MIRALAX / GLYCOLAX Take 17 g by mouth daily as needed for mild constipation (pt takes mon wed and friday).   QUEtiapine 50 MG tablet Commonly known as:  SEROQUEL 25 mg.   senna 8.6 MG Tabs tablet Commonly known as:  SENOKOT Take 1 tablet by mouth at bedtime.   traZODone 50 MG tablet Commonly known as:  DESYREL Take 50 mg by mouth at bedtime.   VASELINE Gel Generic drug:  white petrolatum Apply 1 application topically daily. Applied to bottom, peri area, and thighs topically for rash            Discharge Care Instructions        Start     Ordered   02/04/17 0000  LORazepam (ATIVAN) 0.5 MG tablet  2 times daily     02/04/17 1116   02/04/17 0000  Increase activity slowly     02/04/17 1116      Contact information for follow-up providers    Renata Caprice, DO. Schedule an appointment as soon as possible for a visit in 1 week(s).   Specialty:  Family  Medicine Contact information: [place] [address] [phone]            Contact information for after-discharge care    Destination    [facility] Follow up.   Specialty:  Assisted Living Facility Contact information: [address] [place] [address] [phone]                 Allergies  Allergen Reactions  . Sulfa Antibiotics Rash  . Azilect [Rasagiline] Rash  . Fenofibrate Rash  . Sinemet [Carbidopa-Levodopa] Rash  . Tape Rash and Other (See Comments)    Redness  . Tomato Rash    Pt can eat raw tomatoes.Marland Kitchenate tomato sandwich a few days ago  Current Discharge Medication List    CONTINUE these medications which have CHANGED   Details  LORazepam (ATIVAN) 0.5 MG tablet Take 1 tablet (0.5 mg total) by mouth 2 (two) times daily. Qty: 10 tablet, Refills: 0      CONTINUE these medications which have NOT CHANGED   Details  acetaminophen (TYLENOL) 325 MG tablet Take 650 mg by mouth every 6 (six) hours as needed.    amantadine (SYMMETREL) 100 MG capsule Take 1 capsule (100 mg total) by mouth 2 (two) times daily. Qty: 60 capsule, Refills: 0    carbidopa-levodopa (SINEMET IR) 25-100 MG tablet Take 0.5 tablets by mouth 3 (three) times daily. Qty: 45 tablet, Refills: 7    carboxymethylcellulose 1 % ophthalmic solution 1 drop 3 (three) times daily.    cetirizine (ZYRTEC) 10 MG tablet Take 10 mg by mouth daily.    docusate sodium (COLACE) 100 MG capsule Take 1 capsule (100 mg total) by mouth 2 (two) times daily. Qty: 10 capsule, Refills: 0    ENSURE PLUS (ENSURE PLUS) LIQD Take 1 Can by mouth 2 (two) times daily between meals.    guaiFENesin-dextromethorphan (ROBITUSSIN DM) 100-10 MG/5ML syrup Take 15 mLs by mouth every 4 (four) hours as needed for cough.    mineral oil liquid Place into both ears every Monday. Instill 2 drops in both ears once daily every Monday for Cerumen Impaction.    Multiple  Vitamin (MULTIVITAMIN) capsule Take 1 capsule by mouth daily.    ondansetron (ZOFRAN) 4 MG tablet Take 4 mg by mouth every 6 (six) hours as needed for nausea or vomiting.     polyethylene glycol (MIRALAX / GLYCOLAX) packet Take 17 g by mouth daily as needed for mild constipation (pt takes mon wed and friday).     QUEtiapine (SEROQUEL) 50 MG tablet 25 mg.     senna (SENOKOT) 8.6 MG TABS tablet Take 1 tablet by mouth at bedtime.    traZODone (DESYREL) 50 MG tablet Take 50 mg by mouth at bedtime.    white petrolatum (VASELINE) GEL Apply 1 application topically daily. Applied to bottom, peri area, and thighs topically for rash        Procedures/Studies: Dg Chest 1 View  Result Date: 02/03/2017 CLINICAL DATA:  Acute mental status changes. Current history of diabetes and hypertension. EXAM: CHEST 1 VIEW COMPARISON:  08/15/2016, 01/03/2016 and earlier. FINDINGS: Cardiac silhouette normal in size, unchanged. Thoracic aorta atherosclerotic, unchanged. Hilar and mediastinal contours otherwise unremarkable. Suboptimal inspiration accounts for atelectasis at the lung bases, left greater than right. Stable calcified granuloma at the right lung base. Lungs otherwise clear. Pulmonary vascularity normal. No visible pleural effusions. IMPRESSION: 1. Suboptimal inspiration accounts for mild bibasilar atelectasis, left greater than right. No acute cardiopulmonary disease otherwise. 2.  Aortic Atherosclerosis (ICD10-170.0) Electronically Signed   By: Evangeline Dakin M.D.   On: 02/03/2017 12:34   Ct Head Wo Contrast  Result Date: 02/03/2017 CLINICAL DATA:  Altered mental status, history Parkinson's, type II diabetes mellitus, hypertension, migraines EXAM: CT HEAD WITHOUT CONTRAST TECHNIQUE: Contiguous axial images were obtained from the base of the skull through the vertex without intravenous contrast. COMPARISON:  04/04/2016 ; correlation interval MR brain 04/05/2016 FINDINGS: Brain: Generalized atrophy. Normal  ventricular morphology. No midline shift or mass effect. Small vessel chronic ischemic changes of deep cerebral white matter. Questionable tiny old lacunar infarct at anterior limb of RIGHT internal capsule versus mild white matter disease. No intracranial hemorrhage, mass lesion, evidence of acute infarction, or extra-axial  fluid collection. Vascular: Atherosclerotic calcification of internal carotid and vertebral arteries at skullbase Skull: Intact Sinuses/Orbits: Clear Other: N/A IMPRESSION: Atrophy with small vessel chronic ischemic changes of deep cerebral white matter. Questionable tiny old lacunar infarct at anterior limb RIGHT internal capsule versus mild white matter disease, new since 2017. No acute intracranial abnormalities. Electronically Signed   By: Lavonia Dana M.D.   On: 02/03/2017 12:54   Mr Brain Wo Contrast  Result Date: 02/03/2017 CLINICAL DATA:  Altered mental status. History of hypertension, Parkinson's disease/dementia, diabetes, dyslipidemia. EXAM: MRI HEAD WITHOUT CONTRAST TECHNIQUE: Multiplanar, multiecho pulse sequences of the brain and surrounding structures were obtained without intravenous contrast. COMPARISON:  CT HEAD February 03, 2017 at 1215 hours and MRI of the head April 05, 2016 FINDINGS: Multiple sequences are more mild to moderately motion degraded. BRAIN: No reduced diffusion to suggest acute ischemia. No susceptibility artifact to suggest hemorrhage. Moderate to severe ventriculomegaly on the basis of global parenchymal brain volume loss. Patchy supratentorial white matter FLAIR T2 hyperintensities. No suspicious parenchymal signal, mass or mass effect. No abnormal extra-axial fluid collections. VASCULAR: Normal major intracranial vascular flow voids present at skull base. SKULL AND UPPER CERVICAL SPINE: No abnormal sellar expansion. No suspicious calvarial bone marrow signal. Craniocervical junction maintained. SINUSES/ORBITS: The mastoid air-cells and included  paranasal sinuses are well-aerated. The included ocular globes and orbital contents are non-suspicious. Status post bilateral ocular lens implants. OTHER: None. IMPRESSION: 1. No acute intracranial process on this mildly motion degraded examination. 2. Moderate to severe parenchymal brain volume loss. 3. Moderate chronic small vessel ischemic disease. Electronically Signed   By: Elon Alas M.D.   On: 02/03/2017 15:29   US Carotid Bilateral (at [facility] Only)  Result Date: 02/03/2017 CLINICAL DATA:  TIA, hypertension, coronary artery disease, hyperlipidemia and diabetes. EXAM: BILATERAL CAROTID DUPLEX ULTRASOUND TECHNIQUE: Pearline Cables scale imaging, color Doppler and duplex ultrasound were performed of bilateral carotid and vertebral arteries in the neck. COMPARISON:  None. FINDINGS: Criteria: Quantification of carotid stenosis is based on velocity parameters that correlate the residual internal carotid diameter with NASCET-based stenosis levels, using the diameter of the distal internal carotid lumen as the denominator for stenosis measurement. The following velocity measurements were obtained: RIGHT ICA:  109/18 cm/sec CCA:  81/8 cm/sec SYSTOLIC ICA/CCA RATIO:  2.1 DIASTOLIC ICA/CCA RATIO:  2.0 ECA:  75 cm/sec LEFT ICA:  62/11 cm/sec CCA:  29/9 cm/sec SYSTOLIC ICA/CCA RATIO:  1.2 DIASTOLIC ICA/CCA RATIO:  1.8 ECA:  64 cm/sec RIGHT CAROTID ARTERY: There is moderate calcified plaque at the level of the carotid bulb extending to the ICA origin. Estimated right ICA stenosis is less than 50% based on velocities. RIGHT VERTEBRAL ARTERY: Antegrade flow with normal waveform and velocity. LEFT CAROTID ARTERY: There is a mild amount of calcified plaque at the level of the left carotid bulb and proximal ICA. Estimated left ICA stenosis is less than 50%. LEFT VERTEBRAL ARTERY: Antegrade flow with normal waveform and velocity. IMPRESSION: Plaque at the level of both carotid bulbs and proximal internal carotid arteries,  right greater than left. Estimated bilateral ICA stenoses are less than 50%. Electronically Signed   By: Aletta Edouard M.D.   On: 02/03/2017 17:02      Subjective: Pt has been alert, sitting up in chair, in no distress, no recurrence of change in mental status  Discharge Exam: Vitals:   02/03/17 2009 02/04/17 0704  BP: 101/64 (!) 140/55  Pulse: 71 78  Resp: 20 18  Temp: 98.5  F (36.9 C) (!) 97.5 F (36.4 C)  SpO2: 95% 100%   Vitals:   02/03/17 1400 02/03/17 1700 02/03/17 2009 02/04/17 0704  BP: 117/61 92/62 101/64 (!) 140/55  Pulse: 70 74 71 78  Resp: 18 20 20 18   Temp:  (!) 97.5 F (36.4 C) 98.5 F (36.9 C) (!) 97.5 F (36.4 C)  TempSrc:  Oral Oral Oral  SpO2: 100% 95% 95% 100%  Weight:  55.4 kg (122 lb 2.2 oz)    Height:  5\' 4"  (1.626 m)     General: Pt is alert, awake, not in acute distress Cardiovascular: S1/S2 +, no rubs, no gallops Respiratory: CTA bilaterally, no wheezing, no rhonchi Abdominal: Soft, NT, ND, bowel sounds + Extremities: resting tremor, no edema, no cyanosis   The results of significant diagnostics from this hospitalization (including imaging, microbiology, ancillary and laboratory) are listed below for reference.     Microbiology: Recent Results (from the past 240 hour(s))  MRSA PCR Screening     Status: None   Collection Time: 02/03/17  5:38 PM  Result Value Ref Range Status   MRSA by PCR NEGATIVE NEGATIVE Final    Comment:        The GeneXpert MRSA Assay (FDA approved for NASAL specimens only), is one component of a comprehensive MRSA colonization surveillance program. It is not intended to diagnose MRSA infection nor to guide or monitor treatment for MRSA infections.      Labs: BNP (last 3 results) No results for input(s): BNP in the last 8760 hours. Basic Metabolic Panel:  Recent Labs Lab 02/03/17 1138 02/04/17 0602  NA 139 141  K 3.8 3.8  CL 102 108  CO2 27 26  GLUCOSE 191* 95  BUN 14 15  CREATININE 0.97 0.86   CALCIUM 9.1 8.6*   Liver Function Tests:  Recent Labs Lab 02/03/17 1138 02/04/17 0602  AST 19 15  ALT 7* 5*  ALKPHOS 54 48  BILITOT 0.8 1.0  PROT 6.3* 5.6*  ALBUMIN 3.8 3.4*   No results for input(s): LIPASE, AMYLASE in the last 168 hours. No results for input(s): AMMONIA in the last 168 hours. CBC:  Recent Labs Lab 02/03/17 1138 02/04/17 0602  WBC 4.3 5.2  NEUTROABS 2.0  --   HGB 13.7 12.5  HCT 40.6 37.4  MCV 95.5 95.2  PLT 137* 148*   Cardiac Enzymes:  Recent Labs Lab 02/03/17 1535  TROPONINI <0.03   BNP: Invalid input(s): POCBNP CBG:  Recent Labs Lab 02/03/17 1147 02/03/17 1910 02/04/17 0822  GLUCAP 176* 141* 84   D-Dimer No results for input(s): DDIMER in the last 72 hours. Hgb A1c  Recent Labs  02/03/17 1732  HGBA1C 5.8*   Lipid Profile No results for input(s): CHOL, HDL, LDLCALC, TRIG, CHOLHDL, LDLDIRECT in the last 72 hours. Thyroid function studies No results for input(s): TSH, T4TOTAL, T3FREE, THYROIDAB in the last 72 hours.  Invalid input(s): FREET3 Anemia work up No results for input(s): VITAMINB12, FOLATE, FERRITIN, TIBC, IRON, RETICCTPCT in the last 72 hours. Urinalysis    Component Value Date/Time   COLORURINE YELLOW 02/03/2017 [place] 02/03/2017 1733   LABSPEC 1.011 02/03/2017 1733   PHURINE 7.0 02/03/2017 1733   GLUCOSEU NEGATIVE 02/03/2017 1733   HGBUR NEGATIVE 02/03/2017 1733   BILIRUBINUR NEGATIVE 02/03/2017 1733   KETONESUR NEGATIVE 02/03/2017 1733   PROTEINUR NEGATIVE 02/03/2017 1733   UROBILINOGEN 0.2 12/21/2014 2315   NITRITE NEGATIVE 02/03/2017 1733   LEUKOCYTESUR NEGATIVE 02/03/2017 1733  Sepsis Labs Invalid input(s): PROCALCITONIN,  WBC,  LACTICIDVEN Microbiology Recent Results (from the past 240 hour(s))  MRSA PCR Screening     Status: None   Collection Time: 02/03/17  5:38 PM  Result Value Ref Range Status   MRSA by PCR NEGATIVE NEGATIVE Final    Comment:        The GeneXpert  MRSA Assay (FDA approved for NASAL specimens only), is one component of a comprehensive MRSA colonization surveillance program. It is not intended to diagnose MRSA infection nor to guide or monitor treatment for MRSA infections.    Time coordinating discharge: 35 mins  SIGNED:  Irwin Brakeman, MD  [facility] 02/04/2017, 11:17 AM Pager [phone]  If 7PM-7AM, please contact night-coverage [email or web address] Password [email or web address]

## 2017-02-04 NOTE — Progress Notes (Signed)
*  PRELIMINARY RESULTS* Echocardiogram 2D Echocardiogram has been performed.  [name] 02/04/2017, 12:07 PM

## 2017-02-04 NOTE — Clinical Social Work Note (Signed)
Clinical Social Work Assessment  Patient Details  Name: Catherine [name] MRN: [id number] Date of Birth: [birth year]  Date of referral:  02/04/17               Reason for consult:  Discharge Planning                Permission sought to share information with:  Facility Sport and exercise psychologist, Family Supports Permission granted to share information::  No  Name::     Scientist, physiological::  [facility]  Relationship::     Contact Information:     Housing/Transportation Living arrangements for the past 2 months:  [place] of Information:  Engineer, materials, Control and instrumentation engineer Needed:  None Criminal Activity/Legal Involvement Pertinent to Current Situation/Hospitalization:  No - Comment as needed Significant Relationships:  Neighbor Lives with:  Facility Resident Do you feel safe going back to the place where you live?  Yes Need for family participation in patient care:  Yes (Comment)  Care giving concerns:  CSW received consult regarding discharge planning. Patient resides at [facility] and will return at discharge. CSW to continue to follow and assist with discharge planning needs.   Social Worker assessment / plan:  Patient will return to ALF by car with private sitter.    Employment status:  Retired Nurse, adult PT Recommendations:  Not assessed at this time Information / Referral to community resources:     Patient/Family's Response to care: Facility and Economist and neighbor are in agreement with discharge plan.  Patient/Family's Understanding of and Emotional Response to Diagnosis, Current Treatment, and Prognosis:  Patient's neighbor expressed understanding of CSW role and discharge process as well as medical condition. No questions/concerns about plan or treatment.    Emotional Assessment Appearance:  Appears stated age Attitude/Demeanor/Rapport:  Unable to Assess Affect (typically observed):  Unable  to Assess Orientation:  Oriented to Self, Oriented to Place, Oriented to Situation Alcohol / Substance use:  Not Applicable Psych involvement (Current and /or in the community):  No (Comment)  Discharge Needs  Concerns to be addressed:  Care Coordination Readmission within the last 30 days:  No Current discharge risk:  None Barriers to Discharge:  No Barriers Identified   Benard Halsted, [place] 02/04/2017, 12:49 PM

## 2017-02-04 NOTE — NC FL2 (Signed)
[place] LEVEL OF CARE SCREENING TOOL     IDENTIFICATION  Patient Name: Catherine [name] Birthdate: 1927-09-04 Sex: female Admission Date (Current Location): 02/03/2017  [facility] and [place] Number:  [employer] and Address:  [facility] [address], [place]      Provider Number: [id number]  Attending Physician Name and Address:  Murlean Iba, MD  Relative Name and Phone Number:  Ardyth Gal [phone]    Current Level of Care: Hospital Recommended Level of Care: [place] Prior Approval Number:    Date Approved/Denied:   PASRR Number:    Discharge Plan: Other (Comment) (ALF)    Current Diagnoses: Patient Active Problem List   Diagnosis Date Noted  . Confusion and disorientation 02/03/2017  . Hypothyroidism 02/03/2017  . Gait abnormality 07/15/2016  . Generalized weakness 04/04/2016  . General weakness 04/04/2016  . CAD (coronary artery disease) 04/04/2016  . Pressure injury of skin 04/04/2016  . UTI (urinary tract infection) 01/04/2016  . Acute encephalopathy 01/02/2016  . NSTEMI (non-ST elevated myocardial infarction) ([place]) 01/02/2016  . HTN (hypertension) 01/02/2016  . Pressure ulcer 01/02/2016  . Altered mental status 01/01/2016  . Dementia in Parkinson's disease ([place]) 12/25/2015  . Parkinson's disease ([place]) 05/24/2015  . Parkinsonian tremor ([place]) 05/24/2015  . Left displaced femoral neck fracture ([place]) 05/23/2015  . Closed left hip fracture ([place])   . Hip fracture ([place]) 05/22/2015  . Weakness 12/22/2014  . Ataxia 12/22/2014  . PAT (paroxysmal atrial tachycardia) ([place]) 01/25/2013  . Palpitations 01/11/2013  . Paralysis agitans ([place]) 09/20/2012  . Type 2 diabetes mellitus ([place]) 07/21/2011  . Hyperlipidemia 07/21/2011    Orientation RESPIRATION BLADDER Height & Weight     Self, Situation, Place  Normal Continent, External catheter Weight: 55.4 kg (122 lb 2.2 oz) Height:  5\' 4"   (162.6 cm)  BEHAVIORAL SYMPTOMS/MOOD NEUROLOGICAL BOWEL NUTRITION STATUS      Continent Diet (Regular)  AMBULATORY STATUS COMMUNICATION OF NEEDS Skin   Extensive Assist Verbally PU Stage and Appropriate Care (Stage II/Stage III pressure injury)                       Personal Care Assistance Level of Assistance  Bathing, Feeding, Dressing Bathing Assistance: Maximum assistance Feeding assistance: Maximum assistance Dressing Assistance: Maximum assistance     Functional Limitations Info             SPECIAL CARE FACTORS FREQUENCY                       Contractures      Additional Factors Info  Psychotropic, Code Status, Allergies, Isolation Precautions, Insulin Sliding Scale Code Status Info: DNR Allergies Info: Sulfa Antibiotics, Azilect Rasagiline, Fenofibrate, Sinemet Carbidopa-levodopa, Tape, Tomato Psychotropic Info: Seroquel;Trazadone Insulin Sliding Scale Info: 3x daily with meals Isolation Precautions Info: MRSA in the nose     Current Medications (02/04/2017):  Discharge Medications: CONTINUE these medications which have CHANGED   Details  LORazepam (ATIVAN) 0.5 MG tablet Take 1 tablet (0.5 mg total) by mouth 2 (two) times daily. Qty: 10 tablet, Refills: 0         CONTINUE these medications which have NOT CHANGED   Details  acetaminophen (TYLENOL) 325 MG tablet Take 650 mg by mouth every 6 (six) hours as needed.    amantadine (SYMMETREL) 100 MG capsule Take 1 capsule (100 mg total) by mouth 2 (two) times daily. Qty: 60 capsule, Refills:  0    carbidopa-levodopa (SINEMET IR) 25-100 MG tablet Take 0.5 tablets by mouth 3 (three) times daily. Qty: 45 tablet, Refills: 7    carboxymethylcellulose 1 % ophthalmic solution 1 drop 3 (three) times daily.    cetirizine (ZYRTEC) 10 MG tablet Take 10 mg by mouth daily.    docusate sodium (COLACE) 100 MG capsule Take 1 capsule (100 mg total) by mouth 2 (two) times daily. Qty: 10 capsule, Refills: 0     ENSURE PLUS (ENSURE PLUS) LIQD Take 1 Can by mouth 2 (two) times daily between meals.    guaiFENesin-dextromethorphan (ROBITUSSIN DM) 100-10 MG/5ML syrup Take 15 mLs by mouth every 4 (four) hours as needed for cough.    mineral oil liquid Place into both ears every Monday. Instill 2 drops in both ears once daily every Monday for Cerumen Impaction.    Multiple Vitamin (MULTIVITAMIN) capsule Take 1 capsule by mouth daily.    ondansetron (ZOFRAN) 4 MG tablet Take 4 mg by mouth every 6 (six) hours as needed for nausea or vomiting.     polyethylene glycol (MIRALAX / GLYCOLAX) packet Take 17 g by mouth daily as needed for mild constipation (pt takes mon wed and friday).     QUEtiapine (SEROQUEL) 50 MG tablet 25 mg.     senna (SENOKOT) 8.6 MG TABS tablet Take 1 tablet by mouth at bedtime.    traZODone (DESYREL) 50 MG tablet Take 50 mg by mouth at bedtime.    white petrolatum (VASELINE) GEL Apply 1 application topically daily. Applied to bottom, peri area, and thighs topically for rash     Relevant Imaging Results:  Relevant Lab Results:   Additional [place] Estefano Victory, LCSWA

## 2017-02-04 NOTE — Progress Notes (Signed)
CSW cancelled EMS transport. Patient's caregiver will transport by car.  Patient will DC to: [facility] Anticipated DC date: 02/04/17 Family notified: At bedside Transport by: car   Per MD patient ready for DC to ALF. RN, patient, patient's family, and facility notified of DC. Discharge Summary and FL2 sent to facility.    CSW signing off.  Cedric Fishman, [place] Social Worker [phone]

## 2017-02-04 NOTE — Progress Notes (Signed)
Pt will transport by car with caregiver and POA that is at bedside.

## 2017-02-04 NOTE — Progress Notes (Signed)
Report called and given to staff at [facility]. EMS estimated to be here at 2:00 pm to transport pt back.

## 2017-02-04 NOTE — Care Management Obs Status (Signed)
[place] NOTIFICATION   Patient Details  Name: Catherine [name] MRN: [id number] Date of Birth: [birth year]   Medicare Observation Status Notification Given:  Yes    Byrd Rushlow, Chauncey Reading, RN 02/04/2017, 9:02 AM

## 2017-02-04 NOTE — Evaluation (Signed)
Occupational Therapy Evaluation Patient Details Name: TEMARA LANUM MRN: [id number] DOB: 1927/07/15 Today's Date: 02/04/2017    History of Present Illness ZIYON CEDOTAL is a 83 y.o. female from [place] presented to ED after having a brief episode of AMS.  She has known parkinson's disease with associated dementia, she is not ambulatory and she has been her usual state of health until earlier this morning.  She was noted around lunch time to have an episode where she became altered slumped over and eyes rolled back in head and was difficult to arouse.  She has cerebrovascular disease that is known.  She was not noted to have any seizure activity.  She was observed by her 24/7 caretaker and EMS was called.  She gradually became more responsive en route to the ED.  She was noted in ED to have returned to her baseline per family.    Clinical Impression   Pt received semi-reclined in bed, eating breakfast, caregiver present. Pt from [facility], also has private caregivers 24/7. Pt has hx of Parkinson's disease, per pt and caregiver report pt is at her baseline with functional task completion at this time. Pt requires assistance with all ADL completion and transfer tasks. Pt able to transfer to chair this am with +2 mod assist, small shuffling steps to side prior to turning. Pt limited in mobility by rigidity and bradykinesia. At baseline only transfers to surfaces, does not ambulate. No further OT services at this time as pt is at baseline with ADL completion and has 24/7 assistance.     Follow Up Recommendations  No OT follow up;Supervision/Assistance - 24 hour (Return to ALF with 24/7 caregivers)    Equipment Recommendations  None recommended by OT       Precautions / Restrictions Precautions Precautions: Fall Restrictions Weight Bearing Restrictions: No      Mobility Bed Mobility Overal bed mobility: Needs Assistance Bed Mobility: Supine to Sit     Supine to sit: Mod assist;HOB  elevated        Transfers Overall transfer level: Needs assistance Equipment used: 2 person hand held assist Transfers: Sit to/from [facility] Sit to Stand: Mod assist Stand pivot transfers: Mod assist;+2 physical assistance                ADL either performed or assessed with clinical judgement   ADL Overall ADL's : Needs assistance/impaired Eating/Feeding: Set up;Bed level   Grooming: Wash/dry hands;Set up;Sitting;Bed level   Upper Body Bathing: Moderate assistance;Sitting   Lower Body Bathing: Total assistance;Sitting/lateral leans   Upper Body Dressing : Moderate assistance;Sitting   Lower Body Dressing: Total assistance;Sitting/lateral leans   Toilet Transfer: Moderate assistance;Stand-pivot;Regular Museum/gallery exhibitions officer and Hygiene: Min guard;Sitting/lateral lean               Vision Baseline Vision/History: No visual deficits Patient Visual Report: No change from baseline Vision Assessment?: No apparent visual deficits            Pertinent Vitals/Pain Pain Assessment: 0-10 Pain Score: 2  Pain Location: bilateral heels Pain Descriptors / Indicators: Constant Pain Intervention(s): Limited activity within patient's tolerance;Repositioned     Hand Dominance Right   Extremity/Trunk Assessment Upper Extremity Assessment Upper Extremity Assessment: Generalized weakness   Lower Extremity Assessment Lower Extremity Assessment: Defer to PT evaluation   Cervical / Trunk Assessment Cervical / Trunk Assessment: Kyphotic   Communication Communication Communication: No difficulties   Cognition Arousal/Alertness: Awake/alert Behavior During Therapy: WFL for tasks  assessed/performed Overall Cognitive Status: History of cognitive impairments - at baseline                                                Home Living Family/patient expects to be discharged to:: Assisted living                              Home Equipment: Wheelchair - manual;Hospital bed;Bedside commode;Shower seat   Additional Comments: Pt uses wheelchair primarily for mobility; is able to transfer to lift chair and commode to and from wheelchair with min guard per caregiver      Prior Functioning/Environment Level of Independence: Needs assistance  Gait / Transfers Assistance Needed: Performs stand-pivot transfers with min guard/min assist ADL's / Homemaking Assistance Needed: Assistance for all B/ADL completion            OT Problem List: Decreased activity tolerance;Decreased strength;Decreased range of motion;Impaired balance (sitting and/or standing);Decreased coordination;Decreased safety awareness;Impaired UE functional use       AM-PAC PT "6 Clicks" Daily Activity     Outcome Measure Help from another person eating meals?: A Little Help from another person taking care of personal grooming?: A Little Help from another person toileting, which includes using toliet, bedpan, or urinal?: A Lot Help from another person bathing (including washing, rinsing, drying)?: A Lot Help from another person to put on and taking off regular upper body clothing?: A Lot Help from another person to put on and taking off regular lower body clothing?: A Lot 6 Click Score: 14   End of Session Equipment Utilized During Treatment: Gait belt  Activity Tolerance: Patient tolerated treatment well Patient left: in chair;with call bell/phone within reach;with family/visitor present  OT Visit Diagnosis: Muscle weakness (generalized) (M62.81)                Time: 0820-0902 OT Time Calculation (min): 42 min Charges:  OT General Charges $OT Visit: 1 Visit OT Evaluation $OT Eval Moderate Complexity: 1 Mod G-Codes: OT G-codes **NOT FOR INPATIENT CLASS** Functional Assessment Tool Used: AM-PAC 6 Clicks Daily Activity Functional Limitation: Self care Self Care Current Status (X9371): At least 40 percent but less  than 60 percent impaired, limited or restricted Self Care Goal Status (I9678): At least 40 percent but less than 60 percent impaired, limited or restricted Self Care Discharge Status [phone]): At least 40 percent but less than 60 percent impaired, limited or restricted   Guadelupe Sabin, OTR/L  [phone] 02/04/2017, 9:09 AM

## 2017-05-09 ENCOUNTER — Encounter (HOSPITAL_COMMUNITY): Payer: Self-pay | Admitting: *Deleted

## 2017-05-09 ENCOUNTER — Emergency Department (HOSPITAL_COMMUNITY): Payer: Medicare Other

## 2017-05-09 ENCOUNTER — Emergency Department (HOSPITAL_COMMUNITY)
Admission: EM | Admit: 2017-05-09 | Discharge: 2017-05-09 | Disposition: A | Discharge status: Home / Self Care | Payer: Medicare Other | Attending: Emergency Medicine | Admitting: Emergency Medicine

## 2017-05-09 DIAGNOSIS — G20 Parkinson's disease: Secondary | ICD-10-CM | POA: Insufficient documentation

## 2017-05-09 DIAGNOSIS — E785 Hyperlipidemia, unspecified: Secondary | ICD-10-CM | POA: Insufficient documentation

## 2017-05-09 DIAGNOSIS — E119 Type 2 diabetes mellitus without complications: Secondary | ICD-10-CM | POA: Insufficient documentation

## 2017-05-09 DIAGNOSIS — I10 Essential (primary) hypertension: Secondary | ICD-10-CM | POA: Insufficient documentation

## 2017-05-09 DIAGNOSIS — F0280 Dementia in other diseases classified elsewhere without behavioral disturbance: Secondary | ICD-10-CM | POA: Diagnosis not present

## 2017-05-09 DIAGNOSIS — N390 Urinary tract infection, site not specified: Secondary | ICD-10-CM | POA: Diagnosis present

## 2017-05-09 DIAGNOSIS — E039 Hypothyroidism, unspecified: Secondary | ICD-10-CM | POA: Diagnosis not present

## 2017-05-09 DIAGNOSIS — R4182 Altered mental status, unspecified: Secondary | ICD-10-CM | POA: Diagnosis present

## 2017-05-09 DIAGNOSIS — G9340 Encephalopathy, unspecified: Secondary | ICD-10-CM | POA: Diagnosis present

## 2017-05-09 DIAGNOSIS — G20A1 Parkinson's disease without dyskinesia, without mention of fluctuations: Secondary | ICD-10-CM | POA: Diagnosis present

## 2017-05-09 DIAGNOSIS — Z79899 Other long term (current) drug therapy: Secondary | ICD-10-CM | POA: Diagnosis not present

## 2017-05-09 LAB — URINALYSIS, ROUTINE W REFLEX MICROSCOPIC
BILIRUBIN URINE: NEGATIVE
Glucose, UA: NEGATIVE mg/dL
Hgb urine dipstick: NEGATIVE
KETONES UR: 20 mg/dL — AB
Nitrite: POSITIVE — AB
PH: 5 (ref 5.0–8.0)
PROTEIN: 100 mg/dL — AB
Specific Gravity, Urine: 1.02 (ref 1.005–1.030)

## 2017-05-09 LAB — BASIC METABOLIC PANEL
Anion gap: 12 (ref 5–15)
BUN: 12 mg/dL (ref 6–20)
CHLORIDE: 104 mmol/L (ref 101–111)
CO2: 25 mmol/L (ref 22–32)
CREATININE: 0.93 mg/dL (ref 0.44–1.00)
Calcium: 9 mg/dL (ref 8.9–10.3)
GFR calc Af Amer: >60 mL/min (ref >60)
GFR calc non Af Amer: 54 mL/min — ABNORMAL LOW (ref >60)
GLUCOSE: 151 mg/dL — AB (ref 65–99)
POTASSIUM: 3.8 mmol/L (ref 3.5–5.1)
SODIUM: 141 mmol/L (ref 135–145)

## 2017-05-09 LAB — CBC WITH DIFFERENTIAL/PLATELET
Basophils Absolute: 0 10*3/uL (ref 0.0–0.1)
Basophils Relative: 0 %
EOS ABS: 0.1 10*3/uL (ref 0.0–0.7)
Eosinophils Relative: 1 %
HCT: 40.1 % (ref 36.0–46.0)
HEMOGLOBIN: 13.2 g/dL (ref 12.0–15.0)
LYMPHS ABS: 0.8 10*3/uL (ref 0.7–4.0)
LYMPHS PCT: 8 %
MCH: 32 pg (ref 26.0–34.0)
MCHC: 32.9 g/dL (ref 30.0–36.0)
MCV: 97.1 fL (ref 78.0–100.0)
MONOS PCT: 7 %
Monocytes Absolute: 0.7 10*3/uL (ref 0.1–1.0)
NEUTROS PCT: 84 %
Neutro Abs: 9 10*3/uL — ABNORMAL HIGH (ref 1.7–7.7)
Platelets: 154 10*3/uL (ref 150–400)
RBC: 4.13 MIL/uL (ref 3.87–5.11)
RDW: 12.9 % (ref 11.5–15.5)
WBC: 10.6 10*3/uL — AB (ref 4.0–10.5)

## 2017-05-09 LAB — TROPONIN I: Troponin I: <0.03 ng/mL (ref <0.03)

## 2017-05-09 MED ORDER — CEPHALEXIN 500 MG PO CAPS: 500.0000 mg | ORAL_CAPSULE | Freq: Four times a day (QID) | ORAL | 0 refills | Status: DC

## 2017-05-09 MED ORDER — DEXTROSE 5 % IV SOLN
1.0000 g | Freq: Once | INTRAVENOUS | Status: AC
  Administered 2017-05-09: 1 g via INTRAVENOUS
  Filled 2017-05-09: qty 10

## 2017-05-09 MED ORDER — SODIUM CHLORIDE 0.9 % IV BOLUS (SEPSIS)
1000.0000 mL | Freq: Once | INTRAVENOUS | Status: AC
  Administered 2017-05-09: 1000 mL via INTRAVENOUS

## 2017-05-09 NOTE — Discharge Instructions (Signed)
Patient has improved significantly with IV fluids and antibiotics.  Catherine [name] has a urinary tract infection.  Increase fluids.  Prescription for antibiotic.  Follow-up your primary care doctor.

## 2017-05-09 NOTE — ED Provider Notes (Addendum)
[facility] EMERGENCY DEPARTMENT Provider Note   CSN: [id number] Arrival date & time: 05/09/17  1153     History   Chief Complaint Chief Complaint  Patient presents with  . Altered Mental Status    HPI Catherine [name] is a 83 y.o. female.  Level 5 caveat for dementia.  Patient lives at [facility] assisted living.  Patient arrives via EMS with a chief complaint of altered mental status.  She was given a fluid bolus and became slightly more responsive.  No prodromal illnesses.  No other history available to me.  I spoke to charge nurse at [facility] who confirmed that patient is normally in a wheelchair and interactive verbally.      Past Medical History:  Diagnosis Date  . Arthritis   . Cancer ([place])    basal cell-forehead  . Dementia in Parkinson's disease ([place]) 12/25/2015  . Diabetes mellitus without complication ([place])   . Dyslipidemia   . Hypertension   . Migraine without aura, without mention of intractable migraine without mention of status migrainosus   . Obese   . Paralysis agitans ([place]) 09/20/2012  . Parkinson's disease ([place])   . Sciatica of left side   . Tremor     Patient Active Problem List   Diagnosis Date Noted  . Confusion and disorientation 02/03/2017  . Hypothyroidism 02/03/2017  . Gait abnormality 07/15/2016  . Generalized weakness 04/04/2016  . General weakness 04/04/2016  . CAD (coronary artery disease) 04/04/2016  . Pressure injury of skin 04/04/2016  . UTI (urinary tract infection) 01/04/2016  . Acute encephalopathy 01/02/2016  . NSTEMI (non-ST elevated myocardial infarction) ([place]) 01/02/2016  . HTN (hypertension) 01/02/2016  . Pressure ulcer 01/02/2016  . Altered mental status 01/01/2016  . Dementia in Parkinson's disease ([place]) 12/25/2015  . Parkinson's disease ([place]) 05/24/2015  . Parkinsonian tremor ([place]) 05/24/2015  . Left displaced femoral neck fracture ([place]) 05/23/2015  . Closed left hip fracture ([place])   . Hip fracture ([place]) 05/22/2015   . Weakness 12/22/2014  . Ataxia 12/22/2014  . PAT (paroxysmal atrial tachycardia) ([place]) 01/25/2013  . Palpitations 01/11/2013  . Paralysis agitans ([place]) 09/20/2012  . Type 2 diabetes mellitus ([place]) 07/21/2011  . Hyperlipidemia 07/21/2011    Past Surgical History:  Procedure Laterality Date  . APPENDECTOMY    . Arthroscopic surgery     Left knee  . basal cell carcinoma resection     Forehead  . CATARACT EXTRACTION W/PHACO Right 11/19/2013   Procedure: CATARACT EXTRACTION PHACO AND INTRAOCULAR LENS PLACEMENT (IOC);  Surgeon: Tonny Branch, MD;  Location: [facility];  Service: Ophthalmology;  Laterality: Right;  CDE:  12.95  . CATARACT EXTRACTION W/PHACO Left 12/17/2013   Procedure: CATARACT EXTRACTION PHACO AND INTRAOCULAR LENS PLACEMENT (IOC);  Surgeon: Tonny Branch, MD;  Location: [facility];  Service: Ophthalmology;  Laterality: Left;  CDE 12.10  . HIP PINNING,CANNULATED Left 05/23/2015   Procedure: INTERNAL FIXATION LEFT HIP;  Surgeon: Carole Civil, MD;  Location: [facility];  Service: Orthopedics;  Laterality: Left;  . MOUTH SURGERY    . TONSILLECTOMY    . trigger finger surgery     Bilateral thumb    OB History    No data available       Home Medications    Prior to Admission medications   Medication Sig Start Date End Date Taking? Authorizing Provider  acetaminophen (TYLENOL) 325 MG tablet Take 650 mg by mouth every 6 (six) hours as needed.   Yes [provider]  amantadine (SYMMETREL) 100 MG capsule Take 1 capsule (100 mg total) by mouth 2 (two) times daily. 01/06/16  Yes Kathie Dike, MD  carbidopa-levodopa (SINEMET IR) 25-100 MG tablet Take 0.5 tablets by mouth 3 (three) times daily. 01/20/17  Yes Dennie Bible, NP  carboxymethylcellulose 1 % ophthalmic solution 1 drop 3 (three) times daily.   Yes [provider]  cetirizine (ZYRTEC) 10 MG tablet Take 10 mg by mouth daily.   Yes [provider]  docusate sodium (COLACE) 100 MG capsule Take 1  capsule (100 mg total) by mouth 2 (two) times daily. 05/25/15  Yes Annita Brod, MD  ENSURE PLUS (ENSURE PLUS) LIQD Take 1 Can by mouth 2 (two) times daily between meals.   Yes [provider]  guaiFENesin-dextromethorphan (ROBITUSSIN DM) 100-10 MG/5ML syrup Take 15 mLs by mouth every 4 (four) hours as needed for cough.   Yes [provider]  LORazepam (ATIVAN) 0.5 MG tablet Take 1 tablet (0.5 mg total) by mouth 2 (two) times daily. 02/04/17  Yes Johnson, Clanford L, MD  mineral oil liquid Place into both ears every Monday. Instill 2 drops in both ears once daily every Monday for Cerumen Impaction.   Yes [provider]  Multiple Vitamin (MULTIVITAMIN) capsule Take 1 capsule by mouth daily.   Yes [provider]  ondansetron (ZOFRAN) 4 MG tablet Take 4 mg by mouth every 6 (six) hours as needed for nausea or vomiting.    Yes [provider]  polyethylene glycol (MIRALAX / GLYCOLAX) packet Take 17 g by mouth daily as needed for mild constipation (pt takes mon wed and friday).    Yes [provider]  QUEtiapine (SEROQUEL) 50 MG tablet Take 75 mg by mouth 2 (two) times daily.  07/01/16  Yes [provider]  senna (SENOKOT) 8.6 MG TABS tablet Take 1 tablet by mouth at bedtime.   Yes [provider]  traZODone (DESYREL) 50 MG tablet Take 50 mg by mouth at bedtime.   Yes [provider]  white petrolatum (VASELINE) GEL Apply 1 application topically daily. Applied to bottom, peri area, and thighs topically for rash   Yes [provider]    Family History Family History  Problem Relation Age of Onset  . Rheum arthritis Mother   . Heart attack Father        not certain of COD  . Hypertension Paternal Grandfather   . Stroke Paternal Grandfather   . Cirrhosis Paternal Grandmother     Social History Social History   Tobacco Use  . Smoking status: Never Smoker  . Smokeless tobacco: Never Used  Substance Use  Topics  . Alcohol use: No    Alcohol/week: 0.0 oz  . Drug use: No     Allergies   Sulfa antibiotics; Azilect [rasagiline]; Fenofibrate; Sinemet [carbidopa-levodopa]; Tape; and Tomato   Review of Systems Review of Systems  Unable to perform ROS: Dementia     Physical Exam Updated Vital Signs BP (!) 146/74   Pulse 75   Temp 97.6 F (36.4 C)   Resp 13   Wt 55.3 kg (122 lb)   SpO2 99%   BMI 20.94 kg/m   Physical Exam  Constitutional:  Mumbles in response to direct questions; eyes are closed  HENT:  Head: Normocephalic and atraumatic.  Eyes: Conjunctivae are normal.  Neck: Neck supple.  Cardiovascular: Normal rate and regular rhythm.  Pulmonary/Chest: Effort normal and breath sounds normal.  Abdominal: Soft. Bowel sounds are normal.  Musculoskeletal: Normal range of motion.  Neurological:  Obtunded  Skin: Skin is warm and dry.  Psychiatric:  Flat affect  Nursing note and vitals reviewed.    ED Treatments / Results  Labs (all labs ordered are listed, but only abnormal results are displayed) Labs Reviewed  CBC WITH DIFFERENTIAL/PLATELET - Abnormal; Notable for the following components:      Result Value   WBC 10.6 (*)    Neutro Abs 9.0 (*)    All other components within normal limits  BASIC METABOLIC PANEL - Abnormal; Notable for the following components:   Glucose, Bld 151 (*)    GFR calc non Af Amer 54 (*)    All other components within normal limits  URINALYSIS, ROUTINE W REFLEX MICROSCOPIC - Abnormal; Notable for the following components:   Color, Urine AMBER (*)    APPearance HAZY (*)    Ketones, ur 20 (*)    Protein, ur 100 (*)    Nitrite POSITIVE (*)    Leukocytes, UA SMALL (*)    Bacteria, UA FEW (*)    Squamous Epithelial / LPF 0-5 (*)    All other components within normal limits  URINE CULTURE  TROPONIN I    EKG  EKG Interpretation  Date/Time:  Monday May 09 2017 12:11:48 EST Ventricular Rate:  77 PR Interval:    QRS  Duration: 144 QT Interval:  428 QTC Calculation: 488 R Axis:   18 Text Interpretation:  Sinus rhythm Consider left atrial enlargement IVCD, consider atypical RBBB Probable lateral infarct, age indeterminate Confirmed by Nat Christen [phone]) on 05/09/2017 1:38:07 PM       Radiology Ct Head Wo Contrast  Result Date: 05/09/2017 CLINICAL DATA:  83 year old female with a history of unresponsive. EXAM: CT HEAD WITHOUT CONTRAST TECHNIQUE: Contiguous axial images were obtained from the base of the skull through the vertex without intravenous contrast. COMPARISON:  02/03/2017, MR 02/03/2017 FINDINGS: Brain: No acute intracranial hemorrhage. No midline shift or mass effect. Gray-white differentiation maintained. Confluent hypodensity in the periventricular white matter. Unchanged configuration of the ventricles Vascular: Calcifications of the anterior circulation. Skull: No acute fracture.  No abnormal bony lesion. Sinuses/Orbits: No significant paranasal sinus disease. Unremarkable orbits. Other: None IMPRESSION: No acute finding of the head CT. Evidence of chronic microvascular ischemic disease and mild brain volume loss. Electronically Signed   By: Corrie Mckusick D.O.   On: 05/09/2017 13:49   Dg Chest Port 1 View  Result Date: 05/09/2017 CLINICAL DATA:  Altered mental status this morning, history hypertension, diabetes mellitus, Parkinson's EXAM: PORTABLE CHEST 1 VIEW COMPARISON:  Portable exam 1223 hours compared to 02/03/2017 FINDINGS: Normal heart size, mediastinal contours, and pulmonary vascularity. Atherosclerotic calcifications aorta. Minimal chronic interstitial prominence. Lungs otherwise clear. No infiltrate, pleural effusion or pneumothorax. Skin folds project over LEFT chest. No acute osseous findings. IMPRESSION: No acute abnormalities. Electronically Signed   By: Lavonia Dana M.D.   On: 05/09/2017 12:39    Procedures Procedures (including critical care time)  Medications Ordered in  ED Medications  cefTRIAXone (ROCEPHIN) 1 g in dextrose 5 % 50 mL IVPB (not administered)  sodium chloride 0.9 % bolus 1,000 mL (0 mLs Intravenous Stopped 05/09/17 1528)     Initial Impression / Assessment and Plan / ED Course  I have reviewed the triage vital signs and the nursing notes.  Pertinent labs & imaging results that were available during my care of the patient were reviewed by me and considered in my medical decision making (see  chart for details).     Patient presents with altered mental status.  Caregiver and neighbor confirm change in behavior.  CT head negative.  Chest x-ray negative.  White count 10.6.  Urinalysis shows evidence of infection.  Will hydrate, start Rocephin, urine culture, admit to general medicine.  1700: Patient reexamined by hospitalist Dr. Lorin Mercy.  Patient was alert and interactive.  She responded well to IV fluids.  Inpatient status not needed.  Will discharge patient with Rx for Keflex 500 mg 4 times daily.  Final Clinical Impressions(s) / ED Diagnoses   Final diagnoses:  Altered mental status, unspecified altered mental status type  Urinary tract infection without hematuria, site unspecified    ED Discharge Orders    None       Nat Christen, MD 05/09/17 1539    Nat Christen, MD 05/09/17 1659    Nat Christen, MD 05/09/17 [phone]

## 2017-05-09 NOTE — Consult Note (Signed)
ER Consult Note   [name]:[id number] DOB: 1928-04-29 DOA: 05/09/2017  PCP: Renata Caprice, DO Patient coming from: [facility]  Chief Complaint: AMS  HPI: Catherine [name] is a 83 y.o. female with medical history significant of Parkinson's disease; HTN; HLD; DM; and dementia presenting with AMS.  At the time of presentation, the patient was essentially obtunded and unresponsive.  By the time I saw her, however, she was alert and oriented x 2 and able to report that she had a banana for breakfast today.  She denied complaints.   ED Course:   Altered today, uncertain baseline.  Minimally responsive.  ?UTI, given Rocephin.  Hydration.  Unable to return at this time to the facility due to her inability to participate in her care - she is usually able to sit up in a wheelchair and interact and she cannot do those things today.  Review of Systems: As per HPI; otherwise review of systems reviewed and negative.   Ambulatory Status:  Nonambulatory  Past Medical History:  Diagnosis Date  . Arthritis   . Cancer ([place])    basal cell-forehead  . Dementia in Parkinson's disease ([place]) 12/25/2015  . Diabetes mellitus without complication ([place])   . Dyslipidemia   . Hypertension   . Migraine without aura, without mention of intractable migraine without mention of status migrainosus   . Paralysis agitans ([place]) 09/20/2012  . Parkinson's disease ([place])   . Sciatica of left side   . Tremor     Past Surgical History:  Procedure Laterality Date  . APPENDECTOMY    . Arthroscopic surgery     Left knee  . basal cell carcinoma resection     Forehead  . CATARACT EXTRACTION W/PHACO Right 11/19/2013   Procedure: CATARACT EXTRACTION PHACO AND INTRAOCULAR LENS PLACEMENT (IOC);  Surgeon: Tonny Branch, MD;  Location: [facility];  Service: Ophthalmology;  Laterality: Right;  CDE:  12.95  . CATARACT EXTRACTION W/PHACO Left 12/17/2013   Procedure: CATARACT EXTRACTION PHACO AND INTRAOCULAR LENS PLACEMENT  (IOC);  Surgeon: Tonny Branch, MD;  Location: [facility];  Service: Ophthalmology;  Laterality: Left;  CDE 12.10  . HIP PINNING,CANNULATED Left 05/23/2015   Procedure: INTERNAL FIXATION LEFT HIP;  Surgeon: Carole Civil, MD;  Location: [facility];  Service: Orthopedics;  Laterality: Left;  . MOUTH SURGERY    . TONSILLECTOMY    . trigger finger surgery     Bilateral thumb    Social History   Socioeconomic History  . Marital status: Widowed    Spouse name: Not on file  . Number of children: 0  . Years of education: Not on file  . Highest education level: Not on file  Social Needs  . Financial resource strain: Not on file  . Food insecurity - worry: Not on file  . Food insecurity - inability: Not on file  . Transportation needs - medical: Not on file  . Transportation needs - non-medical: Not on file  Occupational History  . Occupation: Retired    Comment: [employer]  Tobacco Use  . Smoking status: Never Smoker  . Smokeless tobacco: Never Used  Substance and Sexual Activity  . Alcohol use: No    Alcohol/week: 0.0 oz  . Drug use: No  . Sexual activity: Not on file  Other Topics Concern  . Not on file  Social History Narrative   Resides at [facility] Reactions  . Sulfa Antibiotics Rash  . Azilect [  Rasagiline] Rash  . Fenofibrate Rash  . Sinemet [Carbidopa-Levodopa] Rash  . Tape Rash and Other (See Comments)    Redness  . Tomato Rash    Pt can eat raw tomatoes.Marland Kitchenate tomato sandwich a few days ago    Family History  Problem Relation Age of Onset  . Rheum arthritis Mother   . Heart attack Father        not certain of COD  . Hypertension Paternal Grandfather   . Stroke Paternal Grandfather   . Cirrhosis Paternal Grandmother     Prior to Admission medications   Medication Sig Start Date End Date Taking? Authorizing Provider  acetaminophen (TYLENOL) 325 MG tablet Take 650 mg by mouth every 6 (six) hours as needed.   Yes [provider]  amantadine (SYMMETREL) 100 MG capsule Take 1 capsule (100 mg total) by mouth 2 (two) times daily. 01/06/16  Yes Kathie Dike, MD  carbidopa-levodopa (SINEMET IR) 25-100 MG tablet Take 0.5 tablets by mouth 3 (three) times daily. 01/20/17  Yes Dennie Bible, NP  carboxymethylcellulose 1 % ophthalmic solution 1 drop 3 (three) times daily.   Yes [provider]  cetirizine (ZYRTEC) 10 MG tablet Take 10 mg by mouth daily.   Yes [provider]  docusate sodium (COLACE) 100 MG capsule Take 1 capsule (100 mg total) by mouth 2 (two) times daily. 05/25/15  Yes Annita Brod, MD  ENSURE PLUS (ENSURE PLUS) LIQD Take 1 Can by mouth 2 (two) times daily between meals.   Yes [provider]  guaiFENesin-dextromethorphan (ROBITUSSIN DM) 100-10 MG/5ML syrup Take 15 mLs by mouth every 4 (four) hours as needed for cough.   Yes [provider]  LORazepam (ATIVAN) 0.5 MG tablet Take 1 tablet (0.5 mg total) by mouth 2 (two) times daily. 02/04/17  Yes Johnson, Clanford L, MD  mineral oil liquid Place into both ears every Monday. Instill 2 drops in both ears once daily every Monday for Cerumen Impaction.   Yes [provider]  Multiple Vitamin (MULTIVITAMIN) capsule Take 1 capsule by mouth daily.   Yes [provider]  ondansetron (ZOFRAN) 4 MG tablet Take 4 mg by mouth every 6 (six) hours as needed for nausea or vomiting.    Yes [provider]  polyethylene glycol (MIRALAX / GLYCOLAX) packet Take 17 g by mouth daily as needed for mild constipation (pt takes mon wed and friday).    Yes [provider]  QUEtiapine (SEROQUEL) 50 MG tablet Take 75 mg by mouth 2 (two) times daily.  07/01/16  Yes [provider]  senna (SENOKOT) 8.6 MG TABS tablet Take 1 tablet by mouth at bedtime.   Yes [provider]  traZODone (DESYREL) 50 MG tablet Take 50 mg by mouth at bedtime.   Yes [provider]  white  petrolatum (VASELINE) GEL Apply 1 application topically daily. Applied to bottom, peri area, and thighs topically for rash   Yes [provider]    Physical Exam: Vitals:   05/09/17 1500 05/09/17 1600 05/09/17 1630 05/09/17 1700  BP: (!) 146/74 131/74 (!) 143/94 125/74  Pulse: 75 78 74 73  Resp: 13 14 13 12   Temp:      SpO2: 99%  100% 100%  Weight:         General: Appears calm and comfortable and is NAD Eyes:  PERRL, EOMI, normal lids, iris ENT:  grossly normal hearing, lips & tongue, mildly dry mm Neck:  no LAD, masses or  thyromegaly Cardiovascular:  RRR, no m/r/g. No LE edema.  Respiratory:   CTA bilaterally with no wheezes/rales/rhonchi.  Normal respiratory effort. Abdomen:  soft, NT, ND, NABS Skin:  no rash or induration seen on limited exam Musculoskeletal:  grossly normal tone BUE/BLE, good ROM, no bony abnormality; resting tremor noted Psychiatric:  grossly normal mood and affect, speech fluent and appropriate, AOx2 Neurologic:  CN 2-12 grossly intact, moves all extremities in coordinated fashion, sensation intact    Radiological Exams on Admission: Ct Head Wo Contrast  Result Date: 05/09/2017 CLINICAL DATA:  83 year old female with a history of unresponsive. EXAM: CT HEAD WITHOUT CONTRAST TECHNIQUE: Contiguous axial images were obtained from the base of the skull through the vertex without intravenous contrast. COMPARISON:  02/03/2017, MR 02/03/2017 FINDINGS: Brain: No acute intracranial hemorrhage. No midline shift or mass effect. Gray-white differentiation maintained. Confluent hypodensity in the periventricular white matter. Unchanged configuration of the ventricles Vascular: Calcifications of the anterior circulation. Skull: No acute fracture.  No abnormal bony lesion. Sinuses/Orbits: No significant paranasal sinus disease. Unremarkable orbits. Other: None IMPRESSION: No acute finding of the head CT. Evidence of chronic microvascular ischemic disease and mild  brain volume loss. Electronically Signed   By: Corrie Mckusick D.O.   On: 05/09/2017 13:49   Dg Chest Port 1 View  Result Date: 05/09/2017 CLINICAL DATA:  Altered mental status this morning, history hypertension, diabetes mellitus, Parkinson's EXAM: PORTABLE CHEST 1 VIEW COMPARISON:  Portable exam 1223 hours compared to 02/03/2017 FINDINGS: Normal heart size, mediastinal contours, and pulmonary vascularity. Atherosclerotic calcifications aorta. Minimal chronic interstitial prominence. Lungs otherwise clear. No infiltrate, pleural effusion or pneumothorax. Skin folds project over LEFT chest. No acute osseous findings. IMPRESSION: No acute abnormalities. Electronically Signed   By: Lavonia Dana M.D.   On: 05/09/2017 12:39    EKG: Independently reviewed.  NSR with rate 77; nonspecific ST changes with no evidence of acute ischemia   Labs on Admission: I have personally reviewed the available labs and imaging studies at the time of the admission.  Pertinent labs:   Glucose 151 Troponin <0.03 WBC 10.6 UA: 20 ketones, small LE, positive nitrite, 100 protein, few bacteria, 6-30 WBC  Assessment/Plan Active Problems:   Parkinson's disease ([place])   Dementia in Parkinson's disease ([place])   Acute encephalopathy   UTI (urinary tract infection)   -Patient with known Parkinson's and associated dementia presenting with acute encephalopathy -She had a similar presentation in 9/18 for which she was observed in the hospital -Labs and evaluation generally unremarkable other than a possible UTI and very mild dehydration -She was given Rocephin and gentle IVF hydration in the ER with apparent resolution of her encephalopathy -At the time of my evaluation, she was alert, pleasant, and appeared to be back to her described baseline -As a result, it appears to be appropriate to d/c her back to her facility today with PO antibiotics for the UTI (pending urine culture).   The hospitalist service will be happy to  re-evaluate the patient if symptoms recur, but at this time she does not appear to require hospitalization.   Karmen Bongo MD [facility]  If note is complete, please contact covering daytime or nighttime physician. [email or web address] Password [email or web address]  05/09/2017, 5:22 PM

## 2017-05-09 NOTE — ED Triage Notes (Signed)
Brought in via EMS for altered mental status. Per EMS she was given a fluid challenge and became more responsive

## 2017-05-12 LAB — URINE CULTURE

## 2017-05-13 ENCOUNTER — Telehealth: Payer: Self-pay | Admitting: *Deleted

## 2017-05-13 NOTE — Telephone Encounter (Signed)
Post ED Visit - Positive Culture Follow-up  Culture report reviewed by antimicrobial stewardship pharmacist:  []  Elenor Quinones, Pharm.D. []  Heide Guile, Pharm.D., BCPS AQ-ID [x]  Parks Neptune, Pharm.D., BCPS []  Alycia Rossetti, Pharm.D., BCPS []  [place], [place].D., BCPS, AAHIVP []  Legrand Como, Pharm.D., BCPS, AAHIVP []  Salome Arnt, PharmD, BCPS []  Jalene Mullet, PharmD []  Vincenza Hews, PharmD, BCPS  Positive urine culture Treated with Cephalexin, organism sensitive to the same and no further patient follow-up is required at this time.  Harlon Flor [facility] 05/13/2017, 9:06 AM

## 2017-07-21 ENCOUNTER — Telehealth: Payer: Self-pay | Admitting: Neurology

## 2017-07-21 ENCOUNTER — Ambulatory Visit: Payer: Medicare Other | Admitting: Neurology

## 2017-07-21 NOTE — Telephone Encounter (Signed)
This patient canceled same day of appointment for a revisit.

## 2017-07-22 ENCOUNTER — Encounter: Payer: Self-pay | Admitting: Neurology

## 2017-09-08 ENCOUNTER — Other Ambulatory Visit: Payer: Self-pay | Admitting: *Deleted

## 2017-09-08 MED ORDER — CARBIDOPA-LEVODOPA 25-100 MG PO TABS: 0.5000 | ORAL_TABLET | Freq: Three times a day (TID) | ORAL | 5 refills | Status: DC

## 2017-11-24 ENCOUNTER — Emergency Department (HOSPITAL_COMMUNITY): Payer: Medicare Other

## 2017-11-24 ENCOUNTER — Emergency Department (HOSPITAL_COMMUNITY)
Admission: EM | Admit: 2017-11-24 | Discharge: 2017-11-24 | Disposition: A | Discharge status: Skilled Nursing Facility | Payer: Medicare Other | Attending: Emergency Medicine | Admitting: Emergency Medicine

## 2017-11-24 ENCOUNTER — Encounter (HOSPITAL_COMMUNITY): Payer: Self-pay

## 2017-11-24 DIAGNOSIS — E860 Dehydration: Secondary | ICD-10-CM

## 2017-11-24 DIAGNOSIS — E119 Type 2 diabetes mellitus without complications: Secondary | ICD-10-CM | POA: Diagnosis not present

## 2017-11-24 DIAGNOSIS — I10 Essential (primary) hypertension: Secondary | ICD-10-CM | POA: Insufficient documentation

## 2017-11-24 DIAGNOSIS — F0280 Dementia in other diseases classified elsewhere without behavioral disturbance: Secondary | ICD-10-CM | POA: Insufficient documentation

## 2017-11-24 DIAGNOSIS — Z79899 Other long term (current) drug therapy: Secondary | ICD-10-CM | POA: Diagnosis not present

## 2017-11-24 DIAGNOSIS — R627 Adult failure to thrive: Secondary | ICD-10-CM | POA: Diagnosis present

## 2017-11-24 DIAGNOSIS — G20 Parkinson's disease: Secondary | ICD-10-CM | POA: Insufficient documentation

## 2017-11-24 DIAGNOSIS — E039 Hypothyroidism, unspecified: Secondary | ICD-10-CM | POA: Diagnosis not present

## 2017-11-24 LAB — CBC WITH DIFFERENTIAL/PLATELET
BASOS PCT: 0 %
Basophils Absolute: 0 10*3/uL (ref 0.0–0.1)
Eosinophils Absolute: 0.1 10*3/uL (ref 0.0–0.7)
Eosinophils Relative: 1 %
HCT: 43.6 % (ref 36.0–46.0)
HEMOGLOBIN: 14.7 g/dL (ref 12.0–15.0)
LYMPHS PCT: 23 %
Lymphs Abs: 1.7 10*3/uL (ref 0.7–4.0)
MCH: 33.1 pg (ref 26.0–34.0)
MCHC: 33.7 g/dL (ref 30.0–36.0)
MCV: 98.2 fL (ref 78.0–100.0)
MONO ABS: 0.7 10*3/uL (ref 0.1–1.0)
MONOS PCT: 10 %
NEUTROS ABS: 4.7 10*3/uL (ref 1.7–7.7)
NEUTROS PCT: 66 %
Platelets: 142 10*3/uL — ABNORMAL LOW (ref 150–400)
RBC: 4.44 MIL/uL (ref 3.87–5.11)
RDW: 13.1 % (ref 11.5–15.5)
WBC: 7.2 10*3/uL (ref 4.0–10.5)

## 2017-11-24 LAB — URINALYSIS, ROUTINE W REFLEX MICROSCOPIC
BILIRUBIN URINE: NEGATIVE
GLUCOSE, UA: NEGATIVE mg/dL
Hgb urine dipstick: NEGATIVE
Ketones, ur: 5 mg/dL — AB
LEUKOCYTES UA: NEGATIVE
NITRITE: NEGATIVE
Protein, ur: NEGATIVE mg/dL
SPECIFIC GRAVITY, URINE: 1.021 (ref 1.005–1.030)
pH: 5 (ref 5.0–8.0)

## 2017-11-24 LAB — COMPREHENSIVE METABOLIC PANEL
ALBUMIN: 3.7 g/dL (ref 3.5–5.0)
ALK PHOS: 65 U/L (ref 38–126)
ALT: 7 U/L (ref 0–44)
AST: 24 U/L (ref 15–41)
Anion gap: 7 (ref 5–15)
BILIRUBIN TOTAL: 0.8 mg/dL (ref 0.3–1.2)
BUN: 16 mg/dL (ref 8–23)
CO2: 30 mmol/L (ref 22–32)
Calcium: 9.3 mg/dL (ref 8.9–10.3)
Chloride: 106 mmol/L (ref 98–111)
Creatinine, Ser: 0.97 mg/dL (ref 0.44–1.00)
GFR calc Af Amer: 59 mL/min — ABNORMAL LOW (ref >60)
GFR, EST NON AFRICAN AMERICAN: 51 mL/min — AB (ref >60)
GLUCOSE: 139 mg/dL — AB (ref 70–99)
Potassium: 3.9 mmol/L (ref 3.5–5.1)
Sodium: 143 mmol/L (ref 135–145)
Total Protein: 6.6 g/dL (ref 6.5–8.1)

## 2017-11-24 LAB — LACTIC ACID, PLASMA
Lactic Acid, Venous: 1.3 mmol/L (ref 0.5–1.9)
Lactic Acid, Venous: 1.6 mmol/L (ref 0.5–1.9)

## 2017-11-24 LAB — TROPONIN I: Troponin I: <0.03 ng/mL (ref <0.03)

## 2017-11-24 MED ORDER — SODIUM CHLORIDE 0.9 % IV SOLN
INTRAVENOUS | Status: DC
Start: 2017-11-24 — End: 2017-11-25
  Administered 2017-11-24: 15:00:00 via INTRAVENOUS

## 2017-11-24 MED ORDER — SODIUM CHLORIDE 0.9 % IV BOLUS
500.0000 mL | Freq: Once | INTRAVENOUS | Status: AC
  Administered 2017-11-24: 500 mL via INTRAVENOUS

## 2017-11-24 NOTE — ED Notes (Signed)
Pt bladder scanned. Showing less then 37ml urine.   Will scan again after 547ml bolus complete per dr. Thurnell Garbe.

## 2017-11-24 NOTE — ED Provider Notes (Signed)
[facility] EMERGENCY DEPARTMENT Provider Note   CSN: [id number] Arrival date & time: 11/24/17  1324     History   Chief Complaint Chief Complaint  Patient presents with  . Failure To Thrive    HPI Catherine [name] is a 84 y.o. female.  The history is provided by a caregiver and the spouse. The history is limited by the condition of the patient (Hx dementia).  Pt was seen at 1340. Per pt's caregiver: Pt sent from [facility] for decreased PO intake for the past several weeks. Caregiver is concerned pt "might be dehydrated" and "has another urine infection." Denies vomiting/diarrhea, no cough, no fevers. Pt has significant hx of dementia.  Past Medical History:  Diagnosis Date  . Arthritis   . Cancer ([place])    basal cell-forehead  . Dementia in Parkinson's disease ([place]) 12/25/2015  . Diabetes mellitus without complication ([place])   . Dyslipidemia   . Hypertension   . Migraine without aura, without mention of intractable migraine without mention of status migrainosus   . Paralysis agitans ([place]) 09/20/2012  . Parkinson's disease ([place])   . Sciatica of left side   . Tremor     Patient Active Problem List   Diagnosis Date Noted  . Confusion and disorientation 02/03/2017  . Hypothyroidism 02/03/2017  . Gait abnormality 07/15/2016  . Generalized weakness 04/04/2016  . CAD (coronary artery disease) 04/04/2016  . Pressure injury of skin 04/04/2016  . UTI (urinary tract infection) 01/04/2016  . Acute encephalopathy 01/02/2016  . NSTEMI (non-ST elevated myocardial infarction) ([place]) 01/02/2016  . HTN (hypertension) 01/02/2016  . Pressure ulcer 01/02/2016  . Altered mental status 01/01/2016  . Dementia in Parkinson's disease ([place]) 12/25/2015  . Parkinson's disease ([place]) 05/24/2015  . Parkinsonian tremor ([place]) 05/24/2015  . Left displaced femoral neck fracture ([place]) 05/23/2015  . Closed left hip fracture ([place])   . Hip fracture ([place]) 05/22/2015  . Weakness 12/22/2014  . Ataxia  12/22/2014  . PAT (paroxysmal atrial tachycardia) ([place]) 01/25/2013  . Palpitations 01/11/2013  . Paralysis agitans ([place]) 09/20/2012  . Type 2 diabetes mellitus ([place]) 07/21/2011  . Hyperlipidemia 07/21/2011    Past Surgical History:  Procedure Laterality Date  . APPENDECTOMY    . Arthroscopic surgery     Left knee  . basal cell carcinoma resection     Forehead  . CATARACT EXTRACTION W/PHACO Right 11/19/2013   Procedure: CATARACT EXTRACTION PHACO AND INTRAOCULAR LENS PLACEMENT (IOC);  Surgeon: Tonny Branch, MD;  Location: [facility];  Service: Ophthalmology;  Laterality: Right;  CDE:  12.95  . CATARACT EXTRACTION W/PHACO Left 12/17/2013   Procedure: CATARACT EXTRACTION PHACO AND INTRAOCULAR LENS PLACEMENT (IOC);  Surgeon: Tonny Branch, MD;  Location: [facility];  Service: Ophthalmology;  Laterality: Left;  CDE 12.10  . HIP PINNING,CANNULATED Left 05/23/2015   Procedure: INTERNAL FIXATION LEFT HIP;  Surgeon: Carole Civil, MD;  Location: [facility];  Service: Orthopedics;  Laterality: Left;  . MOUTH SURGERY    . TONSILLECTOMY    . trigger finger surgery     Bilateral thumb     OB History   None      Home Medications    Prior to Admission medications   Medication Sig Start Date End Date Taking? Authorizing Provider  acetaminophen (TYLENOL) 325 MG tablet Take 650 mg by mouth every 6 (six) hours as needed.    [provider]  amantadine (SYMMETREL) 100 MG capsule Take 1 capsule (100 mg total) by mouth 2 (two) times  daily. 01/06/16   Kathie Dike, MD  carbidopa-levodopa (SINEMET IR) 25-100 MG tablet Take 0.5 tablets by mouth 3 (three) times daily. 09/08/17   Dennie Bible, NP  carboxymethylcellulose 1 % ophthalmic solution 1 drop 3 (three) times daily.    [provider]  cephALEXin (KEFLEX) 500 MG capsule Take 1 capsule (500 mg total) by mouth 4 (four) times daily. 05/09/17   Nat Christen, MD  cetirizine (ZYRTEC) 10 MG tablet Take 10 mg by mouth daily.    [provider]  docusate sodium (COLACE) 100 MG capsule Take 1 capsule (100 mg total) by mouth 2 (two) times daily. 05/25/15   Annita Brod, MD  ENSURE PLUS (ENSURE PLUS) LIQD Take 1 Can by mouth 2 (two) times daily between meals.    [provider]  guaiFENesin-dextromethorphan (ROBITUSSIN DM) 100-10 MG/5ML syrup Take 15 mLs by mouth every 4 (four) hours as needed for cough.    [provider]  LORazepam (ATIVAN) 0.5 MG tablet Take 1 tablet (0.5 mg total) by mouth 2 (two) times daily. 02/04/17   Johnson, Clanford L, MD  mineral oil liquid Place into both ears every Monday. Instill 2 drops in both ears once daily every Monday for Cerumen Impaction.    [provider]  Multiple Vitamin (MULTIVITAMIN) capsule Take 1 capsule by mouth daily.    [provider]  ondansetron (ZOFRAN) 4 MG tablet Take 4 mg by mouth every 6 (six) hours as needed for nausea or vomiting.     [provider]  polyethylene glycol (MIRALAX / GLYCOLAX) packet Take 17 g by mouth daily as needed for mild constipation (pt takes mon wed and friday).     [provider]  QUEtiapine (SEROQUEL) 50 MG tablet Take 75 mg by mouth 2 (two) times daily.  07/01/16   [provider]  senna (SENOKOT) 8.6 MG TABS tablet Take 1 tablet by mouth at bedtime.    [provider]  traZODone (DESYREL) 50 MG tablet Take 50 mg by mouth at bedtime.    [provider]  white petrolatum (VASELINE) GEL Apply 1 application topically daily. Applied to bottom, peri area, and thighs topically for rash    [provider]    Family History Family History  Problem Relation Age of Onset  . Rheum arthritis Mother   . Heart attack Father        not certain of COD  . Hypertension Paternal Grandfather   . Stroke Paternal Grandfather   . Cirrhosis Paternal Grandmother     Social History Social History   Tobacco Use  . Smoking status: Never Smoker  . Smokeless tobacco:  Never Used  Substance Use Topics  . Alcohol use: No    Alcohol/week: 0.0 oz  . Drug use: No     Allergies   Sulfa antibiotics; Azilect [rasagiline]; Fenofibrate; Sinemet [carbidopa-levodopa]; Tape; and Tomato   Review of Systems Review of Systems  Unable to perform ROS: Dementia     Physical Exam Updated Vital Signs BP (!) 137/101   Pulse 75   Temp 98 F (36.7 C) (Oral)   Resp 12   Wt 55.3 kg (122 lb)   SpO2 100%   BMI 20.94 kg/m    14:31 Orthostatic Vital Signs FS  Orthostatic Lying   BP- Lying: 124/80   Pulse- Lying: 78       Orthostatic Sitting  BP- Sitting: 106/81   Pulse- Sitting: 98       Orthostatic  Standing at 0 minutes  BP- Standing at 0 minutes: 118/96Abnormal    Pulse- Standing at 0 minutes: 107      Physical Exam 1345: Physical examination:  Nursing notes reviewed; Vital signs and O2 SAT reviewed;  Constitutional: Thin, frail. In no acute distress; Head:  Normocephalic, atraumatic; Eyes: EOMI, PERRL, No scleral icterus; ENMT: Mouth and pharynx normal, Mucous membranes dry; Neck: Supple, Full range of motion, No lymphadenopathy; Cardiovascular: Regular rate and rhythm, No gallop; Respiratory: Breath sounds clear & equal bilaterally, No wheezes. Normal respiratory effort/excursion; Chest: Nontender, Movement normal; Abdomen: Soft, Nontender, Nondistended, Normal bowel sounds; Genitourinary: No CVA tenderness; Extremities: Peripheral pulses normal, No tenderness, No edema, No calf edema or asymmetry.; Neuro: Awake, alert, confused per hx. No facial droop. Speech minimal but clear. Moves all extremities spontaneously without apparent gross focal motor deficits.; Skin: Color normal, Warm, Dry.   ED Treatments / Results  Labs (all labs ordered are listed, but only abnormal results are displayed)   EKG EKG Interpretation  Date/Time:  Thursday November 24 2017 14:13:57 EDT Ventricular Rate:  78 PR Interval:    QRS Duration: 86 QT Interval:  398 QTC  Calculation: 454 R Axis:   -7 Text Interpretation:  Sinus rhythm Low voltage, extremity and precordial leads Baseline wander When compared with ECG of 05/09/2017 No significant change was found Confirmed by Francine Graven [phone]) on 11/24/2017 2:58:11 PM   Radiology   Procedures Procedures (including critical care time)  Medications Ordered in ED Medications  sodium chloride 0.9 % bolus 500 mL (500 mLs Intravenous New Bag/Given 11/24/17 1505)  0.9 %  sodium chloride infusion ( Intravenous New Bag/Given 11/24/17 1505)     Initial Impression / Assessment and Plan / ED Course  I have reviewed the triage vital signs and the nursing notes.  Pertinent labs & imaging results that were available during my care of the patient were reviewed by me and considered in my medical decision making (see chart for details).  MDM Reviewed: previous chart, nursing note and vitals Reviewed previous: labs and ECG Interpretation: labs, ECG and x-ray   Results for orders placed or performed during the hospital encounter of 11/24/17  CBC with Differential  Result Value Ref Range   WBC 7.2 4.0 - 10.5 K/uL   RBC 4.44 3.87 - 5.11 MIL/uL   Hemoglobin 14.7 12.0 - 15.0 g/dL   HCT 43.6 36.0 - 46.0 %   MCV 98.2 78.0 - 100.0 fL   MCH 33.1 26.0 - 34.0 pg   MCHC 33.7 30.0 - 36.0 g/dL   RDW 13.1 11.5 - 15.5 %   Platelets 142 (L) 150 - 400 K/uL   Neutrophils Relative % 66 %   Neutro Abs 4.7 1.7 - 7.7 K/uL   Lymphocytes Relative 23 %   Lymphs Abs 1.7 0.7 - 4.0 K/uL   Monocytes Relative 10 %   Monocytes Absolute 0.7 0.1 - 1.0 K/uL   Eosinophils Relative 1 %   Eosinophils Absolute 0.1 0.0 - 0.7 K/uL   Basophils Relative 0 %   Basophils Absolute 0.0 0.0 - 0.1 K/uL  Comprehensive metabolic panel  Result Value Ref Range   Sodium 143 135 - 145 mmol/L   Potassium 3.9 3.5 - 5.1 mmol/L   Chloride 106 98 - 111 mmol/L   CO2 30 22 - 32 mmol/L   Glucose, Bld 139 (H) 70 - 99 mg/dL   BUN 16 8 - 23 mg/dL    Creatinine, Ser 0.97 0.44 - 1.00 mg/dL  Calcium 9.3 8.9 - 10.3 mg/dL   Total Protein 6.6 6.5 - 8.1 g/dL   Albumin 3.7 3.5 - 5.0 g/dL   AST 24 15 - 41 U/L   ALT 7 0 - 44 U/L   Alkaline Phosphatase 65 38 - 126 U/L   Total Bilirubin 0.8 0.3 - 1.2 mg/dL   GFR calc non Af Amer 51 (L) >60 mL/min   GFR calc Af Amer 59 (L) >60 mL/min   Anion gap 7 5 - 15  Troponin I  Result Value Ref Range   Troponin I <0.03 <0.03 ng/mL  Lactic acid, plasma  Result Value Ref Range   Lactic Acid, Venous 1.3 0.5 - 1.9 mmol/L   Dg Chest 2 View Result Date: 11/24/2017 CLINICAL DATA:  05/09/2017 EXAM: CHEST - 2 VIEW COMPARISON:  05/09/2017 FINDINGS: Heart and mediastinal contours are within normal limits. No focal opacities or effusions. No acute bony abnormality. IMPRESSION: No active cardiopulmonary disease. Electronically Signed   By: Rolm Baptise M.D.   On: 11/24/2017 15:05    1555:  Pt not orthostatic on VS. Workup so far is reassuring and without clear indication for admission at this time. Bladder scan with <49ml urine. IVF bolus and gtt ordered. Will re-scan bladder after IVF bolus completed to obtain UA/UC. Dx and testing d/w pt and family.  Questions answered.  Verb understanding, agreeable with plan. Sign out to Dr. Alvino Chapel.     Final Clinical Impressions(s) / ED Diagnoses   Final diagnoses:  None    ED Discharge Orders    None       Francine Graven, DO 11/24/17 1558

## 2017-11-24 NOTE — ED Triage Notes (Signed)
Pt was sent from [facility] in [place]. Staff reports pt has not been eating or drinking for several days. Pt has hx of parkinsons.

## 2017-11-24 NOTE — ED Notes (Signed)
Pt bladder scanned. Pt has less then 55ml of urine.

## 2017-11-24 NOTE — ED Provider Notes (Signed)
  Physical Exam  BP 136/73   Pulse 65   Temp 98 F (36.7 C) (Oral)   Resp 14   Wt 55.3 kg (122 lb)   SpO2 96%   BMI 20.94 kg/m   Physical Exam  ED Course/Procedures     Procedures  MDM  Urine is come back and showed only some ketones.  No infection.  Has tolerated orals.  Will discharge home       Davonna Belling, MD 11/24/17 2136

## 2017-11-26 LAB — URINE CULTURE: Culture: NO GROWTH

## 2017-12-20 IMAGING — DX DG HIP (WITH OR WITHOUT PELVIS) 2-3V*L*
3 series · 3 of 3 positions shown · non-contrast
Comparison: Single view of the pelvis 04/01/2015.

CLINICAL DATA: Status post fall today. Severe left hip pain.
Initial encounter.

EXAM:
DG HIP (WITH OR WITHOUT PELVIS) 2-3V LEFT

[pelvis ap]
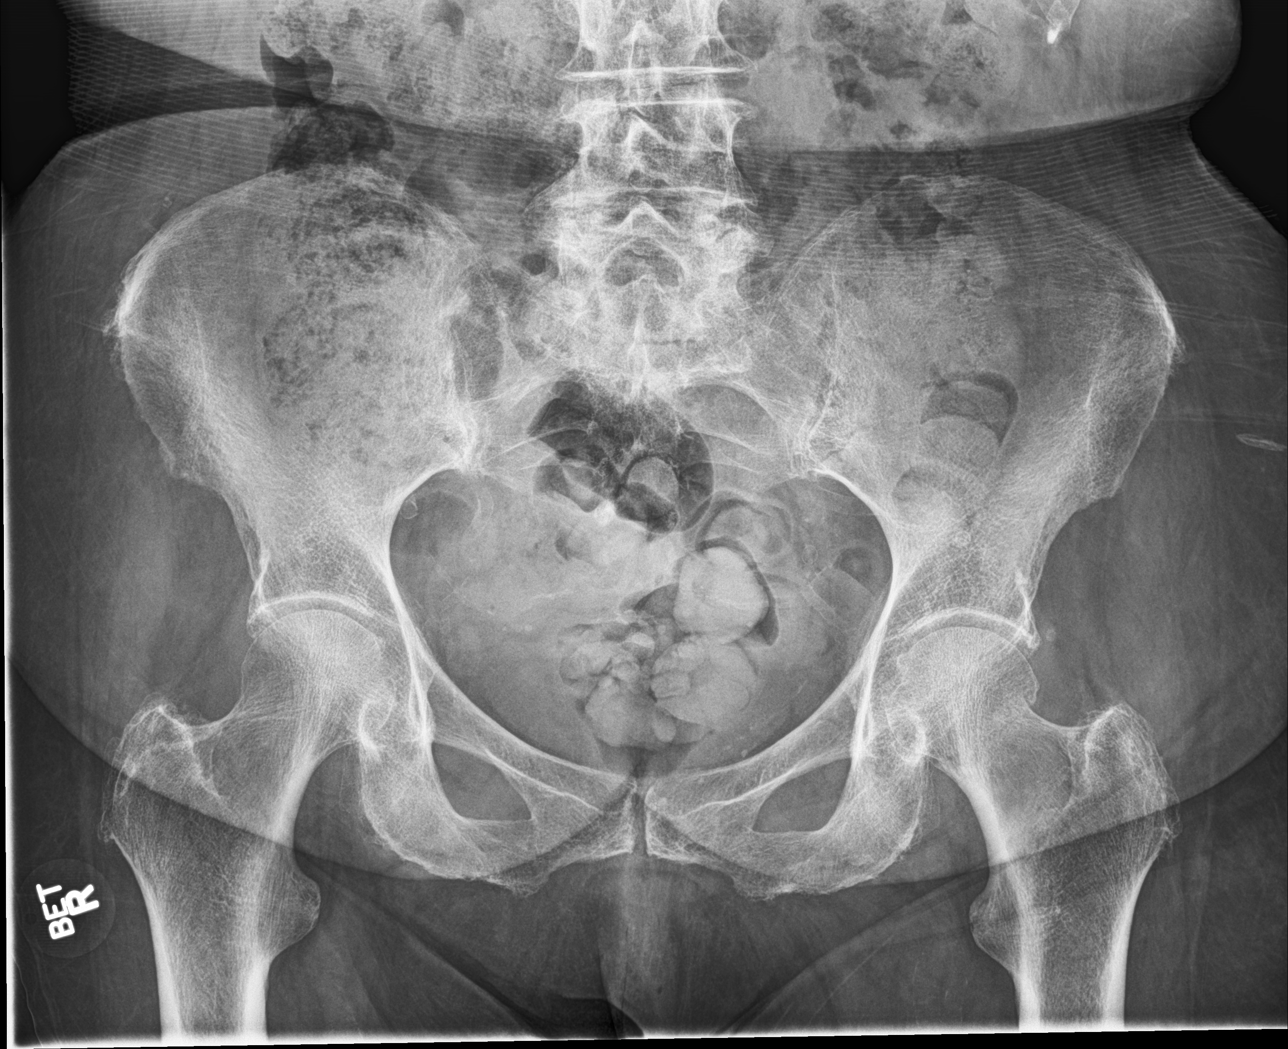

[hip ap]
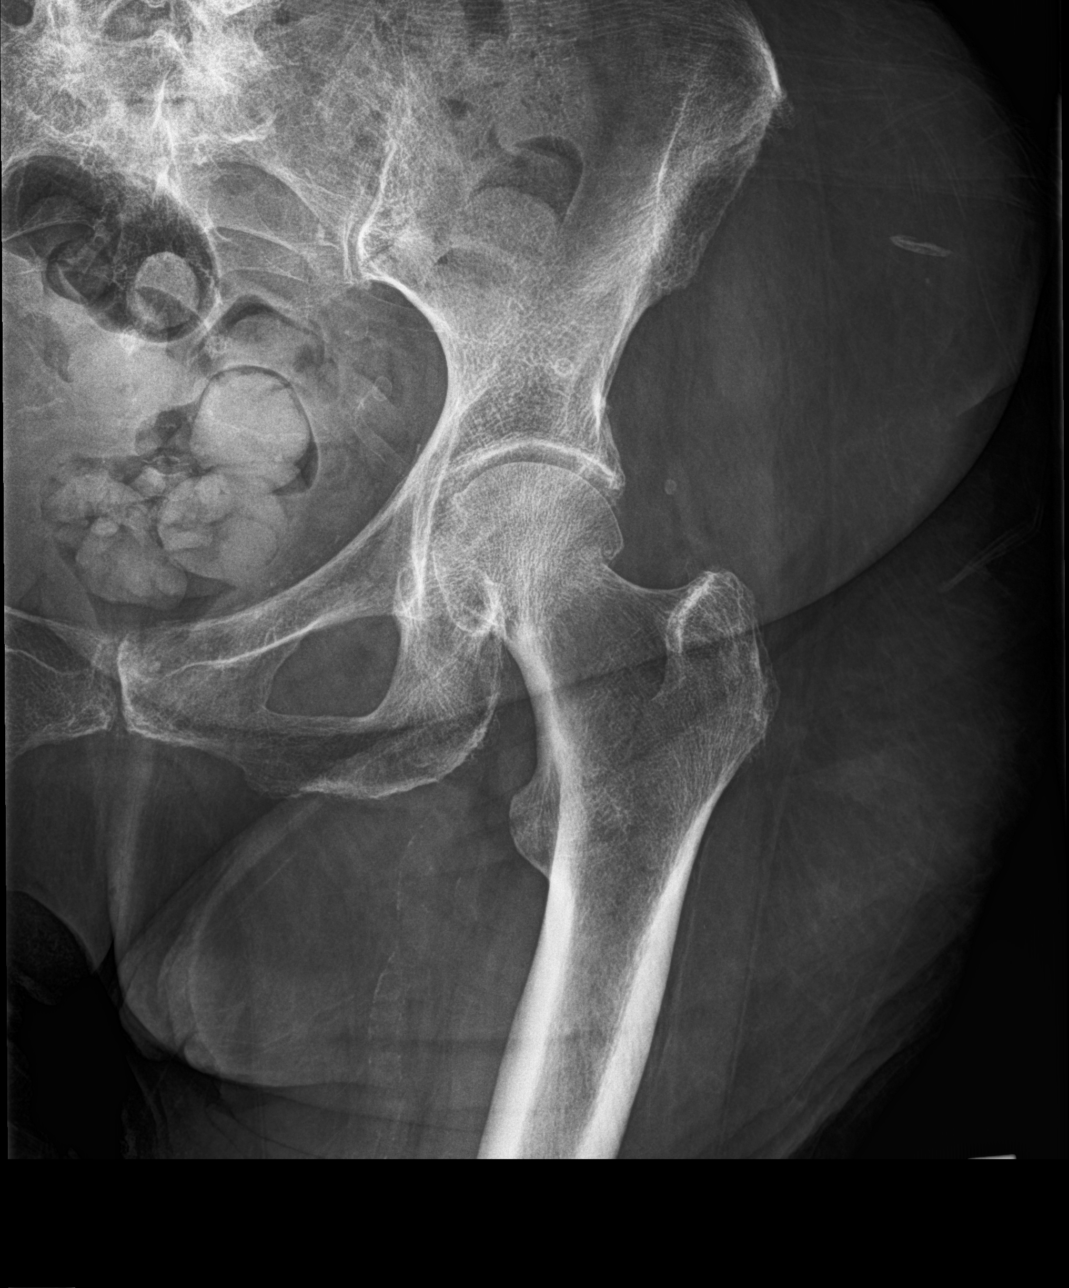

[hip frog leg]
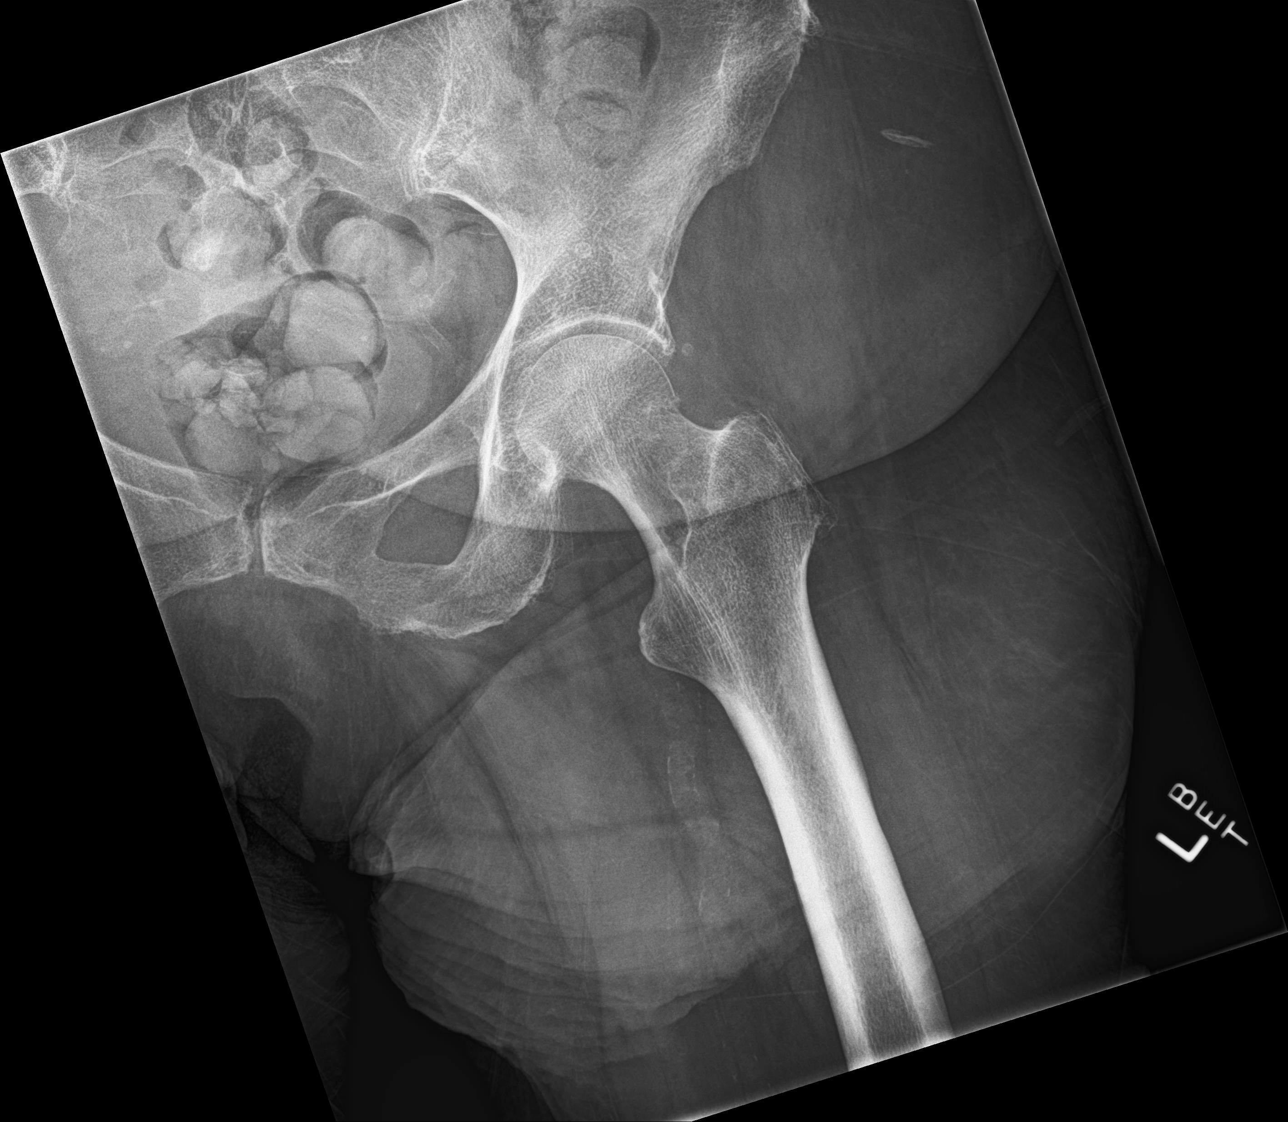

[3 of 3 positions shown; findings below may reference images not displayed]

FINDINGS: The patient has an acute mildly impacted subcapital fracture of the
left hip. No other acute bony or joint abnormality is identified.
IMPRESSION: Acute subcapital fracture left hip.

## 2017-12-21 IMAGING — RF DG HIP (WITH PELVIS) OPERATIVE*L*
1 series · 5 of 5 positions shown · non-contrast
Comparison: Radiography from yesterday

CLINICAL DATA: Left hip fracture.  Initial encounter

EXAM:
OPERATIVE left HIP (WITH PELVIS IF PERFORMED) 2 VIEWS
TECHNIQUE: Fluoroscopic spot image(s) were submitted for interpretation
post-operatively.

[Series 1: run · 5 of 5 slices shown]
[im 1/5]
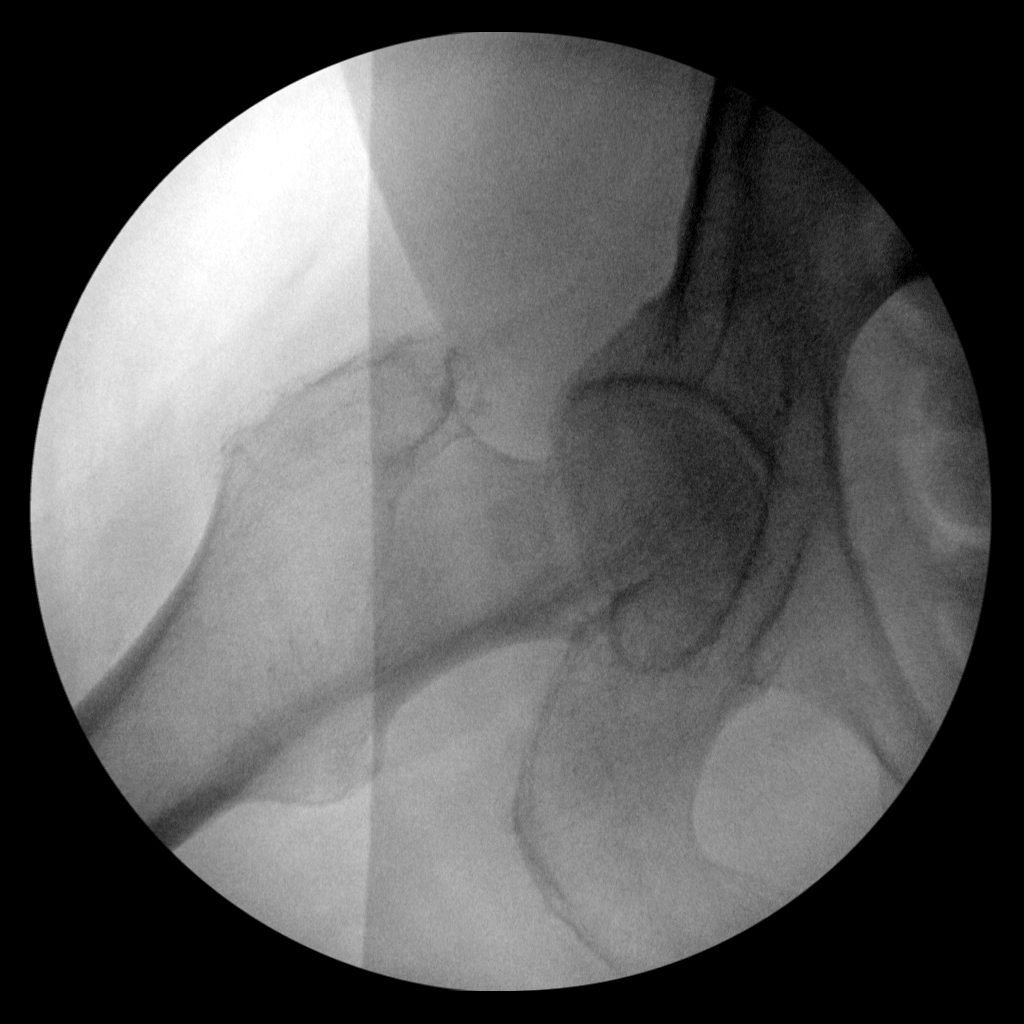
[im 2/5]
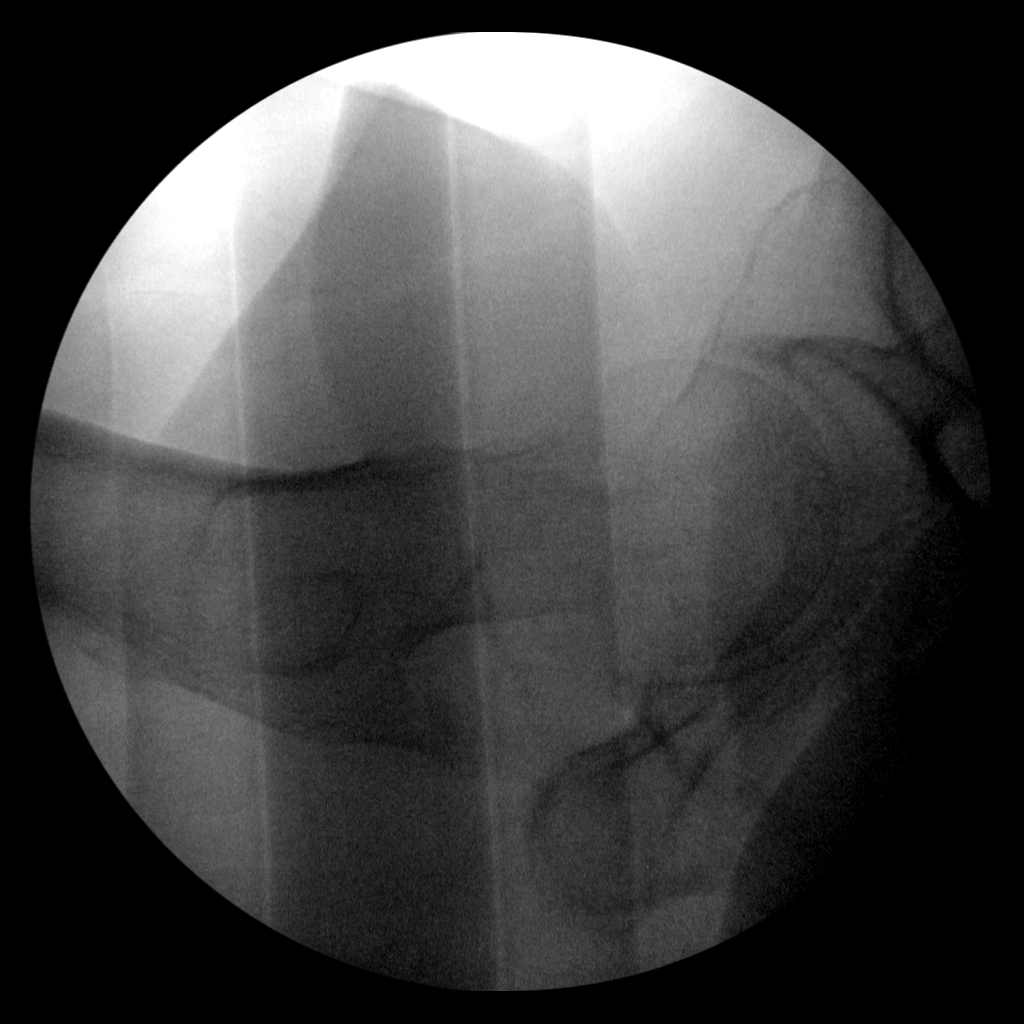
[im 3/5]
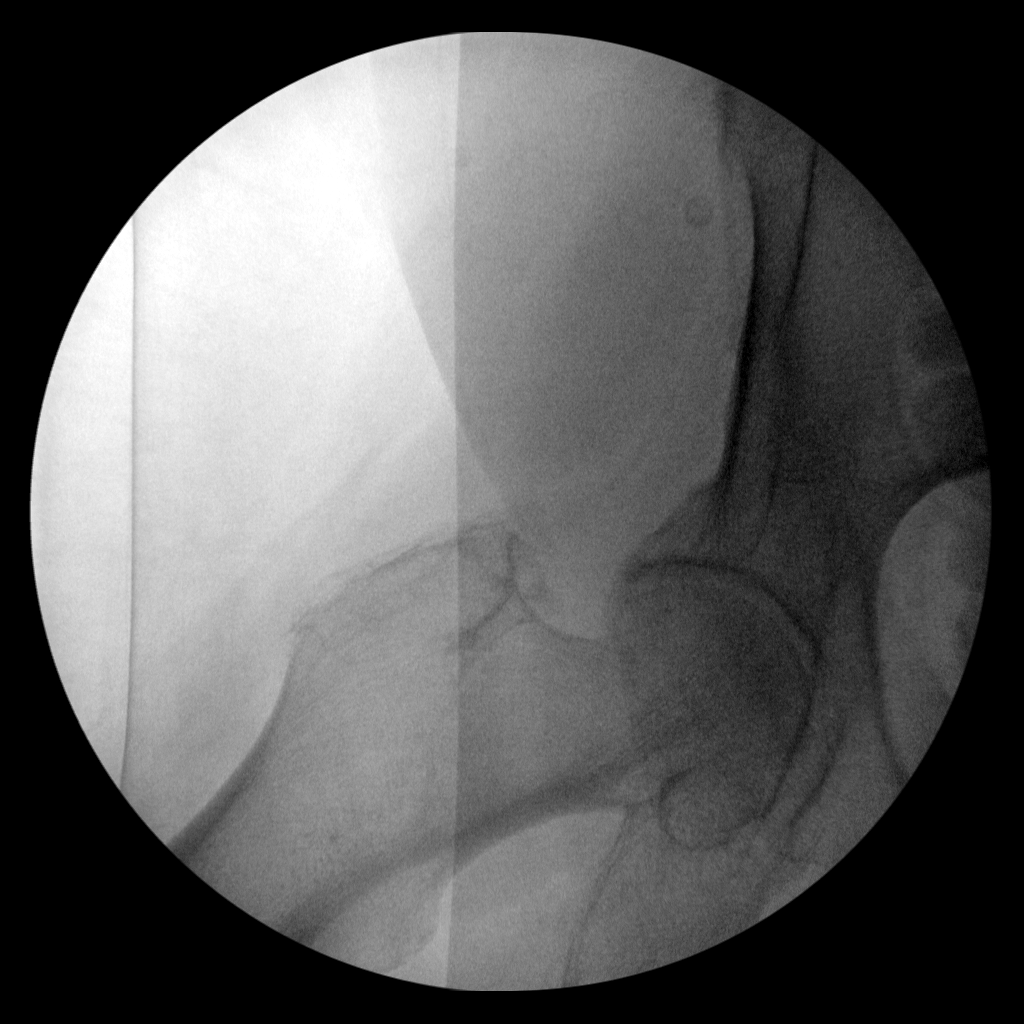
[im 4/5]
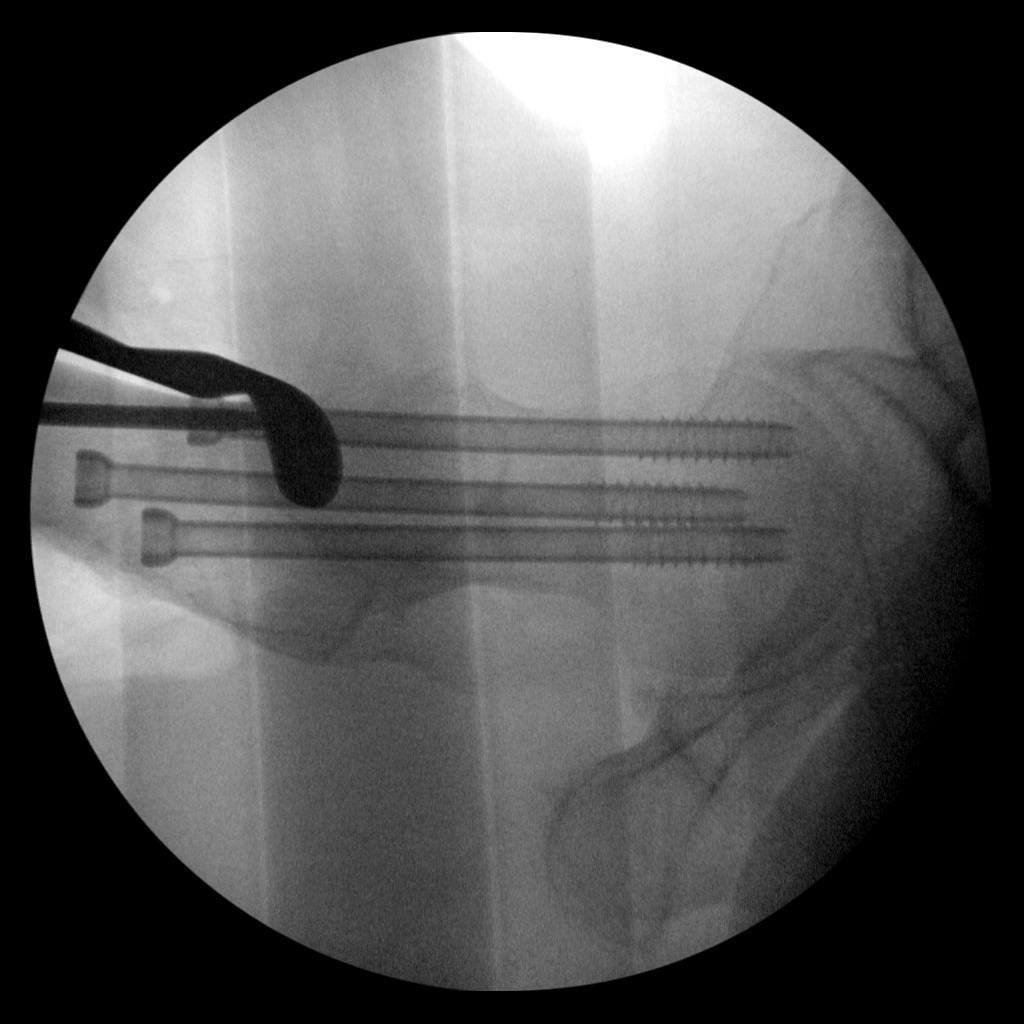
[im 5/5]
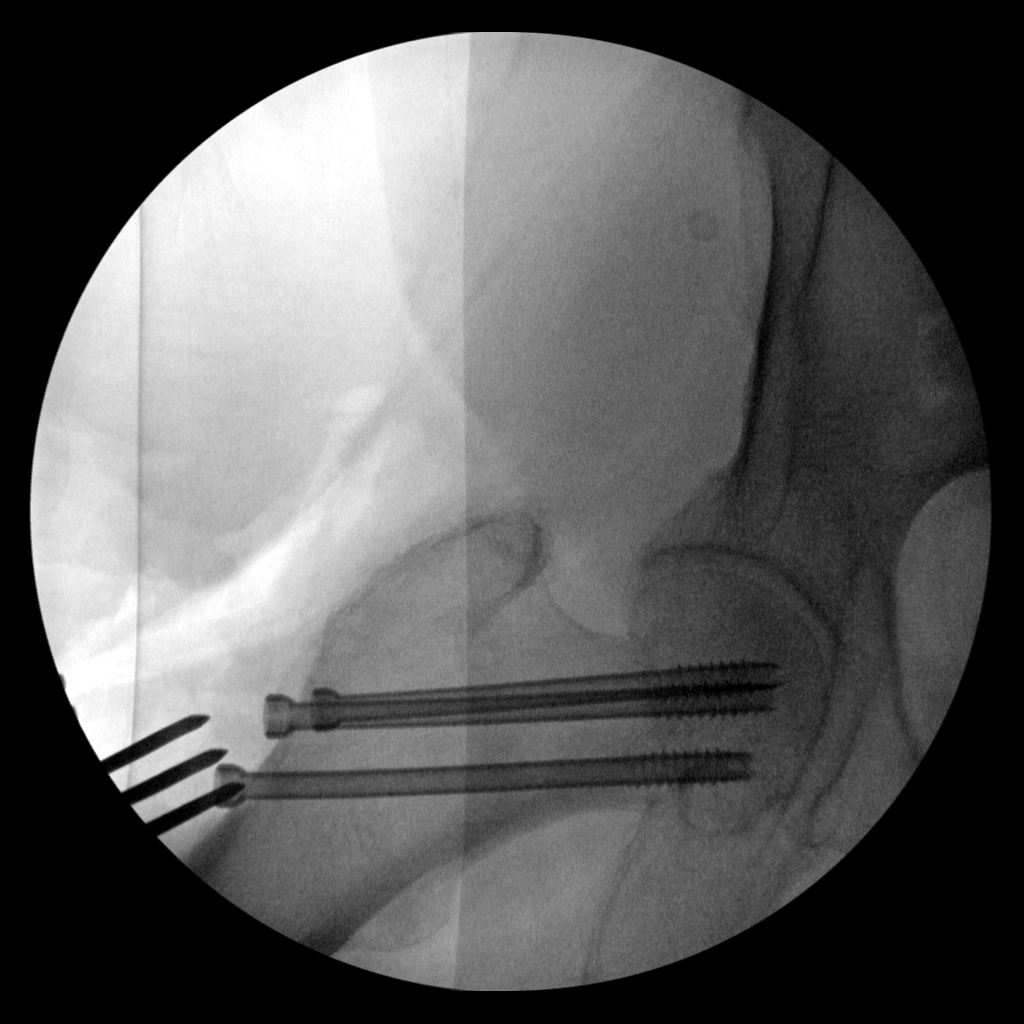

[5 of 5 positions shown; findings below may reference images not displayed]

FINDINGS: The subcapital left femoral neck fracture has been transfixed with 3
screws. No evidence of screw articular surface transgression or
femoral head collapse. Fracture alignment is similar to preoperative
study.
IMPRESSION: Fluoroscopy for femoral neck fracture ORIF.

## 2018-01-23 ENCOUNTER — Other Ambulatory Visit: Payer: Self-pay

## 2018-01-23 ENCOUNTER — Emergency Department (HOSPITAL_COMMUNITY): Payer: Medicare Other

## 2018-01-23 ENCOUNTER — Encounter (HOSPITAL_COMMUNITY): Payer: Self-pay

## 2018-01-23 ENCOUNTER — Emergency Department (HOSPITAL_COMMUNITY)
Admission: EM | Admit: 2018-01-23 | Discharge: 2018-01-23 | Disposition: A | Discharge status: Home / Self Care | Payer: Medicare Other | Attending: Emergency Medicine | Admitting: Emergency Medicine

## 2018-01-23 DIAGNOSIS — Z85828 Personal history of other malignant neoplasm of skin: Secondary | ICD-10-CM | POA: Diagnosis not present

## 2018-01-23 DIAGNOSIS — Z79899 Other long term (current) drug therapy: Secondary | ICD-10-CM | POA: Insufficient documentation

## 2018-01-23 DIAGNOSIS — R404 Transient alteration of awareness: Secondary | ICD-10-CM | POA: Insufficient documentation

## 2018-01-23 DIAGNOSIS — I10 Essential (primary) hypertension: Secondary | ICD-10-CM | POA: Insufficient documentation

## 2018-01-23 DIAGNOSIS — E119 Type 2 diabetes mellitus without complications: Secondary | ICD-10-CM | POA: Insufficient documentation

## 2018-01-23 DIAGNOSIS — I2510 Atherosclerotic heart disease of native coronary artery without angina pectoris: Secondary | ICD-10-CM | POA: Diagnosis not present

## 2018-01-23 DIAGNOSIS — R4182 Altered mental status, unspecified: Secondary | ICD-10-CM | POA: Diagnosis present

## 2018-01-23 DIAGNOSIS — I252 Old myocardial infarction: Secondary | ICD-10-CM | POA: Insufficient documentation

## 2018-01-23 DIAGNOSIS — G20 Parkinson's disease: Secondary | ICD-10-CM | POA: Diagnosis not present

## 2018-01-23 LAB — COMPREHENSIVE METABOLIC PANEL
ALK PHOS: 61 U/L (ref 38–126)
ALT: 5 U/L (ref 0–44)
ANION GAP: 8 (ref 5–15)
AST: 16 U/L (ref 15–41)
Albumin: 4.1 g/dL (ref 3.5–5.0)
BILIRUBIN TOTAL: 0.9 mg/dL (ref 0.3–1.2)
BUN: 11 mg/dL (ref 8–23)
CO2: 28 mmol/L (ref 22–32)
Calcium: 9.1 mg/dL (ref 8.9–10.3)
Chloride: 105 mmol/L (ref 98–111)
Creatinine, Ser: 0.96 mg/dL (ref 0.44–1.00)
GFR calc Af Amer: 59 mL/min — ABNORMAL LOW (ref >60)
GFR calc non Af Amer: 51 mL/min — ABNORMAL LOW (ref >60)
Glucose, Bld: 139 mg/dL — ABNORMAL HIGH (ref 70–99)
POTASSIUM: 3.7 mmol/L (ref 3.5–5.1)
SODIUM: 141 mmol/L (ref 135–145)
TOTAL PROTEIN: 6.6 g/dL (ref 6.5–8.1)

## 2018-01-23 LAB — CBC WITH DIFFERENTIAL/PLATELET
BASOS ABS: 0 10*3/uL (ref 0.0–0.1)
Basophils Relative: 0 %
EOS PCT: 2 %
Eosinophils Absolute: 0.2 10*3/uL (ref 0.0–0.7)
HEMATOCRIT: 39.2 % (ref 36.0–46.0)
Hemoglobin: 13.2 g/dL (ref 12.0–15.0)
LYMPHS ABS: 1.4 10*3/uL (ref 0.7–4.0)
LYMPHS PCT: 15 %
MCH: 33.2 pg (ref 26.0–34.0)
MCHC: 33.7 g/dL (ref 30.0–36.0)
MCV: 98.7 fL (ref 78.0–100.0)
MONO ABS: 0.7 10*3/uL (ref 0.1–1.0)
Monocytes Relative: 7 %
NEUTROS ABS: 7.2 10*3/uL (ref 1.7–7.7)
Neutrophils Relative %: 76 %
PLATELETS: 185 10*3/uL (ref 150–400)
RBC: 3.97 MIL/uL (ref 3.87–5.11)
RDW: 13.2 % (ref 11.5–15.5)
WBC: 9.5 10*3/uL (ref 4.0–10.5)

## 2018-01-23 LAB — URINALYSIS, ROUTINE W REFLEX MICROSCOPIC
BACTERIA UA: NONE SEEN
Bilirubin Urine: NEGATIVE
Glucose, UA: NEGATIVE mg/dL
HGB URINE DIPSTICK: NEGATIVE
Ketones, ur: 5 mg/dL — AB
Nitrite: NEGATIVE
PROTEIN: NEGATIVE mg/dL
SPECIFIC GRAVITY, URINE: 1.017 (ref 1.005–1.030)
pH: 6 (ref 5.0–8.0)

## 2018-01-23 LAB — AMMONIA: Ammonia: 14 umol/L (ref 9–35)

## 2018-01-23 LAB — CBG MONITORING, ED: Glucose-Capillary: 119 mg/dL — ABNORMAL HIGH (ref 70–99)

## 2018-01-23 MED ORDER — SODIUM CHLORIDE 0.9 % IV SOLN
INTRAVENOUS | Status: DC
  Administered 2018-01-23: 13:00:00 via INTRAVENOUS

## 2018-01-23 MED ORDER — SODIUM CHLORIDE 0.9 % IV BOLUS
1000.0000 mL | Freq: Once | INTRAVENOUS | Status: AC
  Administered 2018-01-23: 1000 mL via INTRAVENOUS

## 2018-01-23 NOTE — ED Provider Notes (Signed)
[facility] EMERGENCY DEPARTMENT Provider Note   CSN: [id number] Arrival date & time: 01/23/18  1130     History   Chief Complaint Chief Complaint  Patient presents with  . Altered Mental Status    HPI Catherine [name] is a 84 y.o. female.  HPI Patient presents to the emergency room for altered mental status.  Patient has a history of dementia associated with Parkinson's disease as well as several other medical problems.  According to the patient's caregiver she seemed to be in her usual state of health this morning.  She was confused but conversant.  Patient was being pushed around in her wheelchair when she became less responsive.  According to the caregiver her eyes were open but she was not answering any questions.  She did not think that she lost consciousness.  Patient was evaluated by EMS.  Her blood sugar was 173 that brought her into the emergency room for further evaluation.  Patient was given her usual morning medications which include Ativan Seroquel Zyrtec Colace and amantadine.  Patient's caregiver states that usually she does not act like this however.  No known fevers.  No vomiting.  Has not been complaining any pain.  She has been on antibiotics for urinary tract infection recently Past Medical History:  Diagnosis Date  . Arthritis   . Cancer ([place])    basal cell-forehead  . Dementia in Parkinson's disease ([place]) 12/25/2015  . Diabetes mellitus without complication ([address])   . Dyslipidemia   . Hypertension   . Migraine without aura, without mention of intractable migraine without mention of status migrainosus   . Paralysis agitans ([place]) 09/20/2012  . Parkinson's disease ([place])   . Sciatica of left side   . Tremor     Patient Active Problem List   Diagnosis Date Noted  . Confusion and disorientation 02/03/2017  . Hypothyroidism 02/03/2017  . Gait abnormality 07/15/2016  . Generalized weakness 04/04/2016  . CAD (coronary artery disease) 04/04/2016  . Pressure injury  of skin 04/04/2016  . UTI (urinary tract infection) 01/04/2016  . Acute encephalopathy 01/02/2016  . NSTEMI (non-ST elevated myocardial infarction) ([place]) 01/02/2016  . HTN (hypertension) 01/02/2016  . Pressure ulcer 01/02/2016  . Altered mental status 01/01/2016  . Dementia in Parkinson's disease ([place]) 12/25/2015  . Parkinson's disease ([place]) 05/24/2015  . Parkinsonian tremor ([place]) 05/24/2015  . Left displaced femoral neck fracture ([place]) 05/23/2015  . Closed left hip fracture ([place])   . Hip fracture ([place]) 05/22/2015  . Weakness 12/22/2014  . Ataxia 12/22/2014  . PAT (paroxysmal atrial tachycardia) ([place]) 01/25/2013  . Palpitations 01/11/2013  . Paralysis agitans ([place]) 09/20/2012  . Type 2 diabetes mellitus ([place]) 07/21/2011  . Hyperlipidemia 07/21/2011    Past Surgical History:  Procedure Laterality Date  . APPENDECTOMY    . Arthroscopic surgery     Left knee  . basal cell carcinoma resection     Forehead  . CATARACT EXTRACTION W/PHACO Right 11/19/2013   Procedure: CATARACT EXTRACTION PHACO AND INTRAOCULAR LENS PLACEMENT (IOC);  Surgeon: Tonny Branch, MD;  Location: [facility];  Service: Ophthalmology;  Laterality: Right;  CDE:  12.95  . CATARACT EXTRACTION W/PHACO Left 12/17/2013   Procedure: CATARACT EXTRACTION PHACO AND INTRAOCULAR LENS PLACEMENT (IOC);  Surgeon: Tonny Branch, MD;  Location: [facility];  Service: Ophthalmology;  Laterality: Left;  CDE 12.10  . HIP PINNING,CANNULATED Left 05/23/2015   Procedure: INTERNAL FIXATION LEFT HIP;  Surgeon: Carole Civil, MD;  Location: [facility];  Service: Orthopedics;  Laterality: Left;  . MOUTH SURGERY    . TONSILLECTOMY    . trigger finger surgery     Bilateral thumb     OB History   None      Home Medications    Prior to Admission medications   Medication Sig Start Date End Date Taking? Authorizing Provider  amantadine (SYMMETREL) 100 MG capsule Take 1 capsule (100 mg total) by mouth 2 (two) times daily. 01/06/16  Yes Kathie Dike,  MD  carbidopa-levodopa (SINEMET IR) 25-100 MG tablet Take 0.5 tablets by mouth 3 (three) times daily. 09/08/17  Yes Dennie Bible, NP  cetirizine (ZYRTEC) 10 MG tablet Take 10 mg by mouth daily.   Yes [provider]  docusate sodium (COLACE) 100 MG capsule Take 100 mg by mouth 2 (two) times daily.   Yes [provider]  ENSURE PLUS (ENSURE PLUS) LIQD Take 1 Can by mouth 2 (two) times daily between meals.   Yes [provider]  LORazepam (ATIVAN) 0.5 MG tablet Take 1 tablet (0.5 mg total) by mouth 2 (two) times daily. 02/04/17  Yes Johnson, Clanford L, MD  mirtazapine (REMERON) 7.5 MG tablet Take 7.5 mg by mouth at bedtime.   Yes [provider]  Multiple Vitamin (MULTIVITAMIN) capsule Take 1 capsule by mouth daily.   Yes [provider]  nitrofurantoin, macrocrystal-monohydrate, (MACROBID) 100 MG capsule Take 100 mg by mouth 2 (two) times daily. 12/05/17  Yes [provider]  polyethylene glycol (MIRALAX / GLYCOLAX) packet Take 17 g by mouth every Monday, Wednesday, and Friday.    Yes [provider]  QUEtiapine (SEROQUEL) 50 MG tablet Take 50-75 mg by mouth See admin instructions. 50mg  in the morning and 75mg  at bedtime 07/01/16  Yes [provider]  senna (SENOKOT) 8.6 MG TABS tablet Take 1 tablet by mouth at bedtime.   Yes [provider]  Sodium Phosphates (ENEMA DISPOSABLE RE) Place 1 application rectally 2 (two) times daily as needed. As needed for constipation twice weekly   Yes [provider]  traZODone (DESYREL) 50 MG tablet Take 50 mg by mouth at bedtime.   Yes [provider]  acetaminophen (TYLENOL) 325 MG tablet Take 650 mg by mouth every 6 (six) hours as needed.    [provider]  carboxymethylcellulose 1 % ophthalmic solution 1 drop 3 (three) times daily.    [provider]  doxycycline (MONODOX) 100 MG capsule Take 100 mg by mouth 2 (two) times daily. 10 day  course starting on 11/07/2017-COURSE COMPLETED 11/07/17   [provider]  mineral oil liquid Place into both ears See admin instructions. Instill 2 drops in both ears once daily every Monday for Cerumen Impaction.    [provider]  ondansetron (ZOFRAN) 4 MG tablet Take 4 mg by mouth every 6 (six) hours as needed for nausea or vomiting.     [provider]  white petrolatum (VASELINE) GEL Apply 1 application topically daily. Applied to bottom, peri area, and thighs topically for rash    [provider]    Family History Family History  Problem Relation Age of Onset  . Rheum arthritis Mother   . Heart attack Father        not certain of COD  . Hypertension Paternal Grandfather   . Stroke Paternal Grandfather   . Cirrhosis Paternal Grandmother     Social History Social History   Tobacco Use  . Smoking status: Never Smoker  . Smokeless tobacco: Never  Used  Substance Use Topics  . Alcohol use: No    Alcohol/week: 0.0 standard drinks  . Drug use: No     Allergies   Sulfa antibiotics; Azilect [rasagiline]; Fenofibrate; Sinemet [carbidopa-levodopa]; Tape; and Tomato   Review of Systems Review of Systems  All other systems reviewed and are negative.    Physical Exam Updated Vital Signs BP 122/79   Pulse 68   Temp (!) 97.5 F (36.4 C) (Oral)   Resp 14   Ht 1.549 m (5\' 1" )   Wt 49.9 kg   SpO2 100%   BMI 20.78 kg/m   Physical Exam  Constitutional: She appears listless. No distress.  HENT:  Head: Normocephalic and atraumatic.  Right Ear: External ear normal.  Left Ear: External ear normal.  Eyes: Conjunctivae are normal. Right eye exhibits no discharge. Left eye exhibits no discharge. No scleral icterus.  Neck: Neck supple. No tracheal deviation present.  Cardiovascular: Normal rate, regular rhythm and intact distal pulses.  Pulmonary/Chest: Effort normal and breath sounds normal. No stridor. No respiratory distress. She has no  wheezes. She has no rales.  Abdominal: Soft. Bowel sounds are normal. She exhibits no distension. There is no tenderness. There is no rebound and no guarding.  Musculoskeletal: She exhibits no edema or tenderness.  Neurological: She appears listless. Cranial nerve deficit: no facial droop,  She exhibits normal muscle tone. She displays no seizure activity. GCS eye subscore is 4. GCS verbal subscore is 3. GCS motor subscore is 5.  Pt does not answer my questions although she did mumble and respond to her caregiver, does not follow commands, seems to resist passive movement of her extremities and attempts to open her eyelids,   Skin: Skin is warm and dry. No rash noted. She is not diaphoretic.  Psychiatric: She has a normal mood and affect.  Nursing note and vitals reviewed.    ED Treatments / Results  Labs (all labs ordered are listed, but only abnormal results are displayed) Labs Reviewed  COMPREHENSIVE METABOLIC PANEL - Abnormal; Notable for the following components:      Result Value   Glucose, Bld 139 (*)    GFR calc non Af Amer 51 (*)    GFR calc Af Amer 59 (*)    All other components within normal limits  URINALYSIS, ROUTINE W REFLEX MICROSCOPIC - Abnormal; Notable for the following components:   Color, Urine AMBER (*)    APPearance HAZY (*)    Ketones, ur 5 (*)    Leukocytes, UA TRACE (*)    Non Squamous Epithelial 0-5 (*)    All other components within normal limits  URINE CULTURE  CBC WITH DIFFERENTIAL/PLATELET  AMMONIA    EKG EKG Interpretation  Date/Time:  Monday January 23 2018 11:39:17 EDT Ventricular Rate:  73 PR Interval:    QRS Duration: 86 QT Interval:  408 QTC Calculation: 450 R Axis:   -18 Text Interpretation:  Sinus rhythm Consider left atrial enlargement Borderline left axis deviation Abnormal R-wave progression, early transition No significant change since last tracing Confirmed by Dorie Rank [phone]) on 01/23/2018 12:13:30 PM   Radiology Ct Head Wo  Contrast  Result Date: 01/23/2018 CLINICAL DATA:  Altered mental status/unresponsiveness EXAM: CT HEAD WITHOUT CONTRAST TECHNIQUE: Contiguous axial images were obtained from the base of the skull through the vertex without intravenous contrast. COMPARISON:  May 09, 2017 FINDINGS: Brain: There is moderate diffuse atrophy. There is no intracranial mass, hemorrhage, extra-axial fluid collection, or midline shift. There is  patchy small vessel disease in the centra semiovale bilaterally. Elsewhere gray-white compartments appear normal. No evident acute infarct. Vascular: No hyperdense vessels. There is calcification in each distal vertebral artery and carotid siphon region. Skull: The bony calvarium appears intact. Sinuses/Orbits: There is patchy opacity in the right sphenoid sinus. Other visualized paranasal sinuses are clear. Visualized orbits appear symmetric bilaterally. Other: Visualized mastoid air cells are clear. There is air posterior and slightly to the right of the clivus as well as in lateral sella regions on both sides and in the right cavernous sinus region. IMPRESSION: 1. Atrophy with patchy periventricular small vessel disease. No acute infarct. No mass or hemorrhage. 2.  Foci of arterial vascular calcification. 3.  Right sphenoid sinus disease. 4. Foci of air which appears to reside in venous structures in the perisellar and periclival regions. Suspect iatrogenic introduction of air. Question any history of trauma that could lead to this finding. Electronically Signed   By: Lowella Grip III M.D.   On: 01/23/2018 13:30   Dg Chest Port 1 View  Result Date: 01/23/2018 CLINICAL DATA:  Altered mental status. EXAM: PORTABLE CHEST 1 VIEW COMPARISON:  11/24/2017 and 05/09/2017 radiographs. FINDINGS: The heart size and mediastinal contours are normal. The lungs are clear. There is no pleural effusion or pneumothorax. No acute osseous findings are identified. There is mild aortic atherosclerosis.  Telemetry leads overlie the chest. IMPRESSION: Stable chest.  No active cardiopulmonary process. Electronically Signed   By: Richardean Sale M.D.   On: 01/23/2018 12:38    Procedures Procedures (including critical care time)  Medications Ordered in ED Medications  sodium chloride 0.9 % bolus 1,000 mL (0 mLs Intravenous Stopped 01/23/18 1325)    And  0.9 %  sodium chloride infusion ( Intravenous New Bag/Given 01/23/18 1326)     Initial Impression / Assessment and Plan / ED Course  I have reviewed the triage vital signs and the nursing notes.  Pertinent labs & imaging results that were available during my care of the patient were reviewed by me and considered in my medical decision making (see chart for details).  Clinical Course as of Jan 24 1548  Mon Jan 23, 2018  1531 Initial labs shows normal CBC and electrolyte panel   [JK]  1531 CT scan findings reviewed.  Patient does not have any evidence of trauma.  I suspect the incidental finding is related to iatrogenic air embolism   [JK]  1546 Patient is now awake and alert.  She is answering questions.  She appears to be back at her baseline   [JK]    Clinical Course User Index [JK] Dorie Rank, MD    Patient presented to the emergency room for evaluation of an episode of altered level of consciousness.  According to the history the patient did not lose consciousness but stopped answering questions and responding.  Initially in the emergency room the patient would not speak or answer any my questions.  Her mental status has improved and she seems to be back at her baseline.  She is answering questions appropriately.  She denies any complaints.  Patient's CT scan does not show any acute abnormalities other than some incidental air likely iatrogenic.  No evidence of significant dehydration or infection.  Patient's urinalysis has some minor abnormalities but she is being treated for UTI.  Point I doubt stroke or TIA.  It is possible her  symptoms may been related to the medication she was given in the morning which did not  include Ativan.  At this time there does not appear to be any evidence of an acute emergency medical condition and the patient appears stable for discharge with appropriate outpatient follow up.   Final Clinical Impressions(s) / ED Diagnoses   Final diagnoses:  Transient alteration of awareness    ED Discharge Orders    None       Dorie Rank, MD 01/23/18 1549

## 2018-01-23 NOTE — Discharge Instructions (Signed)
Continue your current medications.  Follow-up with your primary care doctor later this week to be rechecked.  Return as needed for recurrent or worsening symptoms

## 2018-01-23 NOTE — ED Triage Notes (Signed)
Pt brought to ED via [facility] for Altered Mental Status. Pt stays at [facility] and was being pushed in her wheelchair and was unresponsive. Pt responds to deep sternal rub. Glucose per EMS 173. Pt currently being treated for UTI with Macobid on day 3. Pt was also given Ativan, Seroquel, Zyrtec, Colace, Amantadine and Boost this am.

## 2018-01-25 LAB — URINE CULTURE: Culture: NO GROWTH

## 2018-01-26 ENCOUNTER — Ambulatory Visit: Payer: Medicare Other | Admitting: Neurology

## 2018-01-26 ENCOUNTER — Encounter: Payer: Self-pay | Admitting: Neurology

## 2018-01-26 VITALS — BP 150/80 | HR 80 | Ht 61.0 in

## 2018-01-26 DIAGNOSIS — R55 Syncope and collapse: Secondary | ICD-10-CM | POA: Diagnosis not present

## 2018-01-26 DIAGNOSIS — G20 Parkinson's disease: Secondary | ICD-10-CM | POA: Diagnosis not present

## 2018-01-26 NOTE — Patient Instructions (Signed)
We will check an EEG

## 2018-01-26 NOTE — Progress Notes (Signed)
Reason for visit: Parkinson's disease, near syncope  Catherine [name] is a 84 y.o. female  History of present illness:  Catherine [name] is an 84 year old right-handed white female with a history of Parkinson's disease associated with a significant dementing illness.  The patient currently is in an extended care facility, she requires assistance with bathing and dressing, she is able to feed herself.  The patient went to the emergency room on 23 January 2018 with an event of near syncope.  The patient was noted to have nausea, she was sitting up in a wheelchair at the time.  The patient then became unresponsive, she appeared to have her eyes open, she was not unconscious.  She would not answer questions.  She appeared to be blanched in the face, she was diaphoretic.  Her caretakers got her out of the wheelchair and laid her down on the floor, shortly thereafter she began to respond.  She was taken to the emergency room, blood pressure there was unremarkable, a CT scan of the brain showed no acute changes, there is a moderate level small vessel ischemic changes noted.  The patient was felt to have had a urinary tract infection, she is on Macrobid for this.  The patient is back to her usual baseline at this time.  The patient has good days and bad days with her ability to sleep at night, she does not choke when swallowing, her voice is soft and whispery.  She has not ambulated in greater than 2 years.  She remains on low-dose Sinemet and she takes Symmetrel.  She returns to this office for an evaluation.  Past Medical History:  Diagnosis Date  . Arthritis   . Cancer ([place])    basal cell-forehead  . Dementia in Parkinson's disease ([place]) 12/25/2015  . Diabetes mellitus without complication ([place])   . Dyslipidemia   . Hypertension   . Migraine without aura, without mention of intractable migraine without mention of status migrainosus   . Paralysis agitans ([place]) 09/20/2012  . Parkinson's disease ([place])   .  Sciatica of left side   . Tremor     Past Surgical History:  Procedure Laterality Date  . APPENDECTOMY    . Arthroscopic surgery     Left knee  . basal cell carcinoma resection     Forehead  . CATARACT EXTRACTION W/PHACO Right 11/19/2013   Procedure: CATARACT EXTRACTION PHACO AND INTRAOCULAR LENS PLACEMENT (IOC);  Surgeon: Tonny Branch, MD;  Location: [facility];  Service: Ophthalmology;  Laterality: Right;  CDE:  12.95  . CATARACT EXTRACTION W/PHACO Left 12/17/2013   Procedure: CATARACT EXTRACTION PHACO AND INTRAOCULAR LENS PLACEMENT (IOC);  Surgeon: Tonny Branch, MD;  Location: [facility];  Service: Ophthalmology;  Laterality: Left;  CDE 12.10  . HIP PINNING,CANNULATED Left 05/23/2015   Procedure: INTERNAL FIXATION LEFT HIP;  Surgeon: Carole Civil, MD;  Location: [facility];  Service: Orthopedics;  Laterality: Left;  . MOUTH SURGERY    . TONSILLECTOMY    . trigger finger surgery     Bilateral thumb    Family History  Problem Relation Age of Onset  . Rheum arthritis Mother   . Heart attack Father        not certain of COD  . Hypertension Paternal Grandfather   . Stroke Paternal Grandfather   . Cirrhosis Paternal Grandmother     Social history:  reports that she has never smoked. She has never used smokeless tobacco. She reports that she does not drink  alcohol or use drugs.  Medications:  Prior to Admission medications   Medication Sig Start Date End Date Taking? Authorizing Provider  acetaminophen (TYLENOL) 325 MG tablet Take 650 mg by mouth every 6 (six) hours as needed.   Yes [provider]  amantadine (SYMMETREL) 100 MG capsule Take 1 capsule (100 mg total) by mouth 2 (two) times daily. 01/06/16  Yes Kathie Dike, MD  carbidopa-levodopa (SINEMET IR) 25-100 MG tablet Take 0.5 tablets by mouth 3 (three) times daily. 09/08/17  Yes Dennie Bible, NP  carboxymethylcellulose 1 % ophthalmic solution 1 drop 3 (three) times daily.   Yes [provider]  cetirizine  (ZYRTEC) 10 MG tablet Take 10 mg by mouth daily.   Yes [provider]  docusate sodium (COLACE) 100 MG capsule Take 100 mg by mouth 2 (two) times daily.   Yes [provider]  lactose free nutrition (BOOST) LIQD Take 1 Container by mouth 2 (two) times daily between meals.   Yes [provider]  LORazepam (ATIVAN) 0.5 MG tablet Take 1 tablet (0.5 mg total) by mouth 2 (two) times daily. 02/04/17  Yes Johnson, Clanford L, MD  mineral oil liquid Place into both ears See admin instructions. Instill 2 drops in both ears once daily every Monday for Cerumen Impaction.   Yes [provider]  mirtazapine (REMERON) 7.5 MG tablet Take 7.5 mg by mouth at bedtime.   Yes [provider]  Multiple Vitamin (MULTIVITAMIN) capsule Take 1 capsule by mouth daily.   Yes [provider]  nitrofurantoin, macrocrystal-monohydrate, (MACROBID) 100 MG capsule Take 100 mg by mouth 2 (two) times daily. 12/05/17  Yes [provider]  ondansetron (ZOFRAN) 4 MG tablet Take 4 mg by mouth every 6 (six) hours as needed for nausea or vomiting.    Yes [provider]  polyethylene glycol (MIRALAX / GLYCOLAX) packet Take 17 g by mouth every Monday, Wednesday, and Friday.    Yes [provider]  QUEtiapine (SEROQUEL) 50 MG tablet Take 50-75 mg by mouth See admin instructions. 50mg  in the morning and 75mg  at bedtime 07/01/16  Yes [provider]  senna (SENOKOT) 8.6 MG TABS tablet Take 1 tablet by mouth at bedtime.   Yes [provider]  Sodium Phosphates (ENEMA DISPOSABLE RE) Place 1 application rectally 2 (two) times daily as needed. As needed for constipation twice weekly   Yes [provider]  traZODone (DESYREL) 50 MG tablet Take 50 mg by mouth at bedtime.   Yes [provider]  white petrolatum (VASELINE) GEL Apply 1 application topically daily. Applied to bottom, peri area, and thighs topically for rash   Yes [provider]      Allergies  Allergen Reactions  . Sulfa Antibiotics Rash  . Azilect [Rasagiline] Rash  . Fenofibrate Rash  . Sinemet [Carbidopa-Levodopa] Rash  . Tape Rash and Other (See Comments)    Redness  . Tomato Rash    Pt can eat raw tomatoes.Marland Kitchenate tomato sandwich a few days ago    ROS:  Out of a complete 14 system review of symptoms, the patient complains only of the following symptoms, and all other reviewed systems are negative.  Runny nose Constipation Frequent infections Sleep talking Skin wounds Hallucinations Tremors  Blood pressure (!) 150/80, pulse 80, height 5\' 1"  (1.549 m).  Physical Exam  General: The patient is alert and cooperative at the time of the examination.  Eyes: Pupils are equal, round, and reactive to light.  Neck: The neck is supple, no carotid bruits are noted.  Respiratory: The respiratory examination is clear.  Cardiovascular: The cardiovascular examination reveals a regular rate and rhythm, no obvious murmurs or rubs are noted.  Skin: Extremities are without significant edema.  Neurologic Exam  Mental status: The patient is alert and will cooperate during examination, she is oriented to name, she knows she is in an office but does not know what type of office.  She is not oriented to month or year.  Cranial nerves: Facial symmetry is present. There is good sensation of the face to pinprick and soft touch bilaterally. The strength of the facial muscles and the muscles to head turning and shoulder shrug are normal bilaterally. Speech is hypophonic, dysarthric, difficult to understand.  Extraocular movements are full. Visual fields are full. The tongue is midline, and the patient has symmetric elevation of the soft palate. No obvious hearing deficits are noted.  Motor: The motor testing reveals 5 over 5 strength of all 4 extremities. Good symmetric motor tone is noted throughout.  Sensory: Sensory testing is intact to pinprick,  soft touch and vibration sensation on all 4 extremities. No evidence of extinction is noted.  Coordination: Cerebellar testing reveals good finger-nose-finger bilaterally but she is unable to perform heel-to-shin on either side secondary to apraxia.  Occasional resting tremors are noted with the right upper extremity.  Gait and station: Gait could not be tested, the patient wheelchair-bound.  Increased tone is noted in both lower extremities.  Reflexes: Deep tendon reflexes are symmetric and normal bilaterally. Toes are downgoing bilaterally.   CT head 01/23/18:  IMPRESSION: 1. Atrophy with patchy periventricular small vessel disease. No acute infarct. No mass or hemorrhage.  2.  Foci of arterial vascular calcification.  3.  Right sphenoid sinus disease.  4. Foci of air which appears to reside in venous structures in the perisellar and periclival regions. Suspect iatrogenic introduction of air. Question any history of trauma that could lead to this Finding.  * CT scan images were reviewed online. I agree with the written report.    Assessment/Plan:  1.  Parkinson's disease  2.  Dementia  3.  Near syncopal event, likely vasovagal episode  The patient appears to have an event consistent with vasovagal syncope.  The patient will be sent for an EEG study, she will remain on her current dose of Sinemet, she will follow-up in 6 months.  Jill Alexanders MD 01/26/2018 10:25 AM  [facility] [address] [place], [place] [address]  Phone [phone] Fax [phone]

## 2018-01-27 IMAGING — DX DG HIP (WITH OR WITHOUT PELVIS) 2-3V*L*
3 series · 3 of 3 positions shown · non-contrast
Comparison: Plain films left hip 06/24/2015.

CLINICAL DATA: Status post trip and fall today. Left hip pain.
History of prior fracture fixation. Initial encounter.

EXAM:
DG HIP (WITH OR WITHOUT PELVIS) 2-3V LEFT

[pelvis ap]
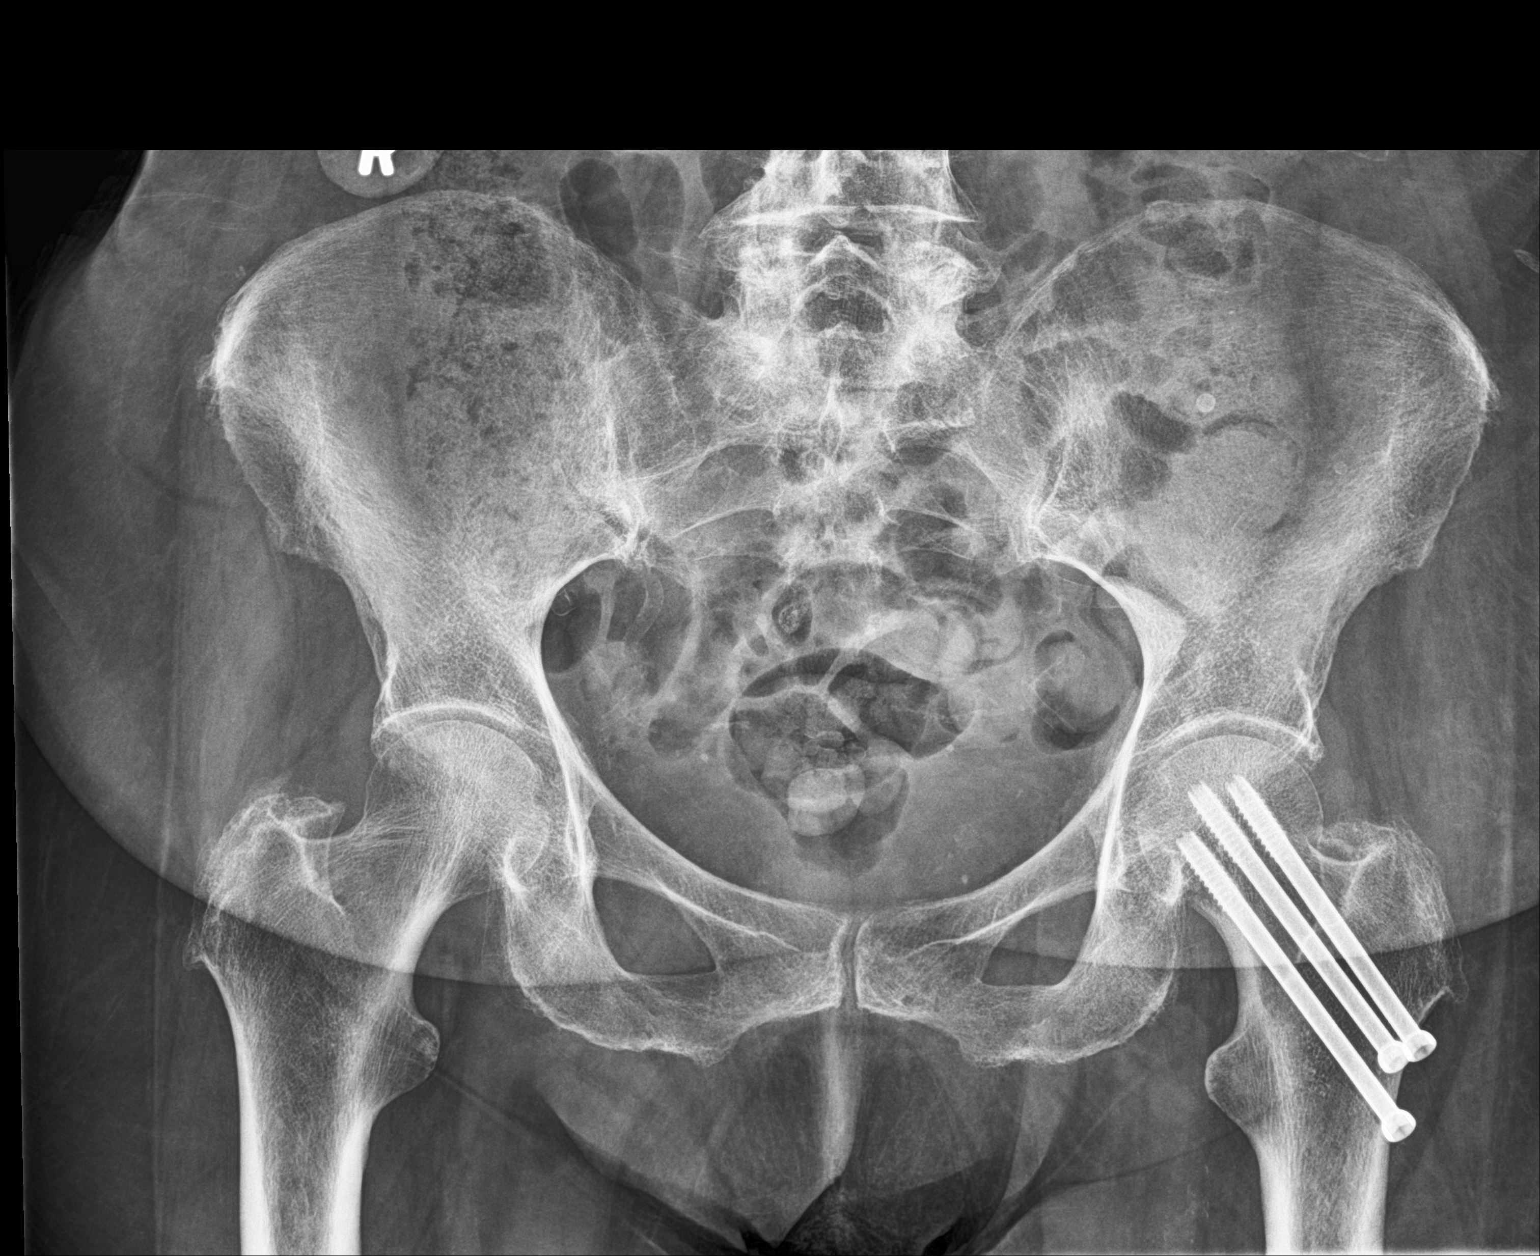

[hip ap]
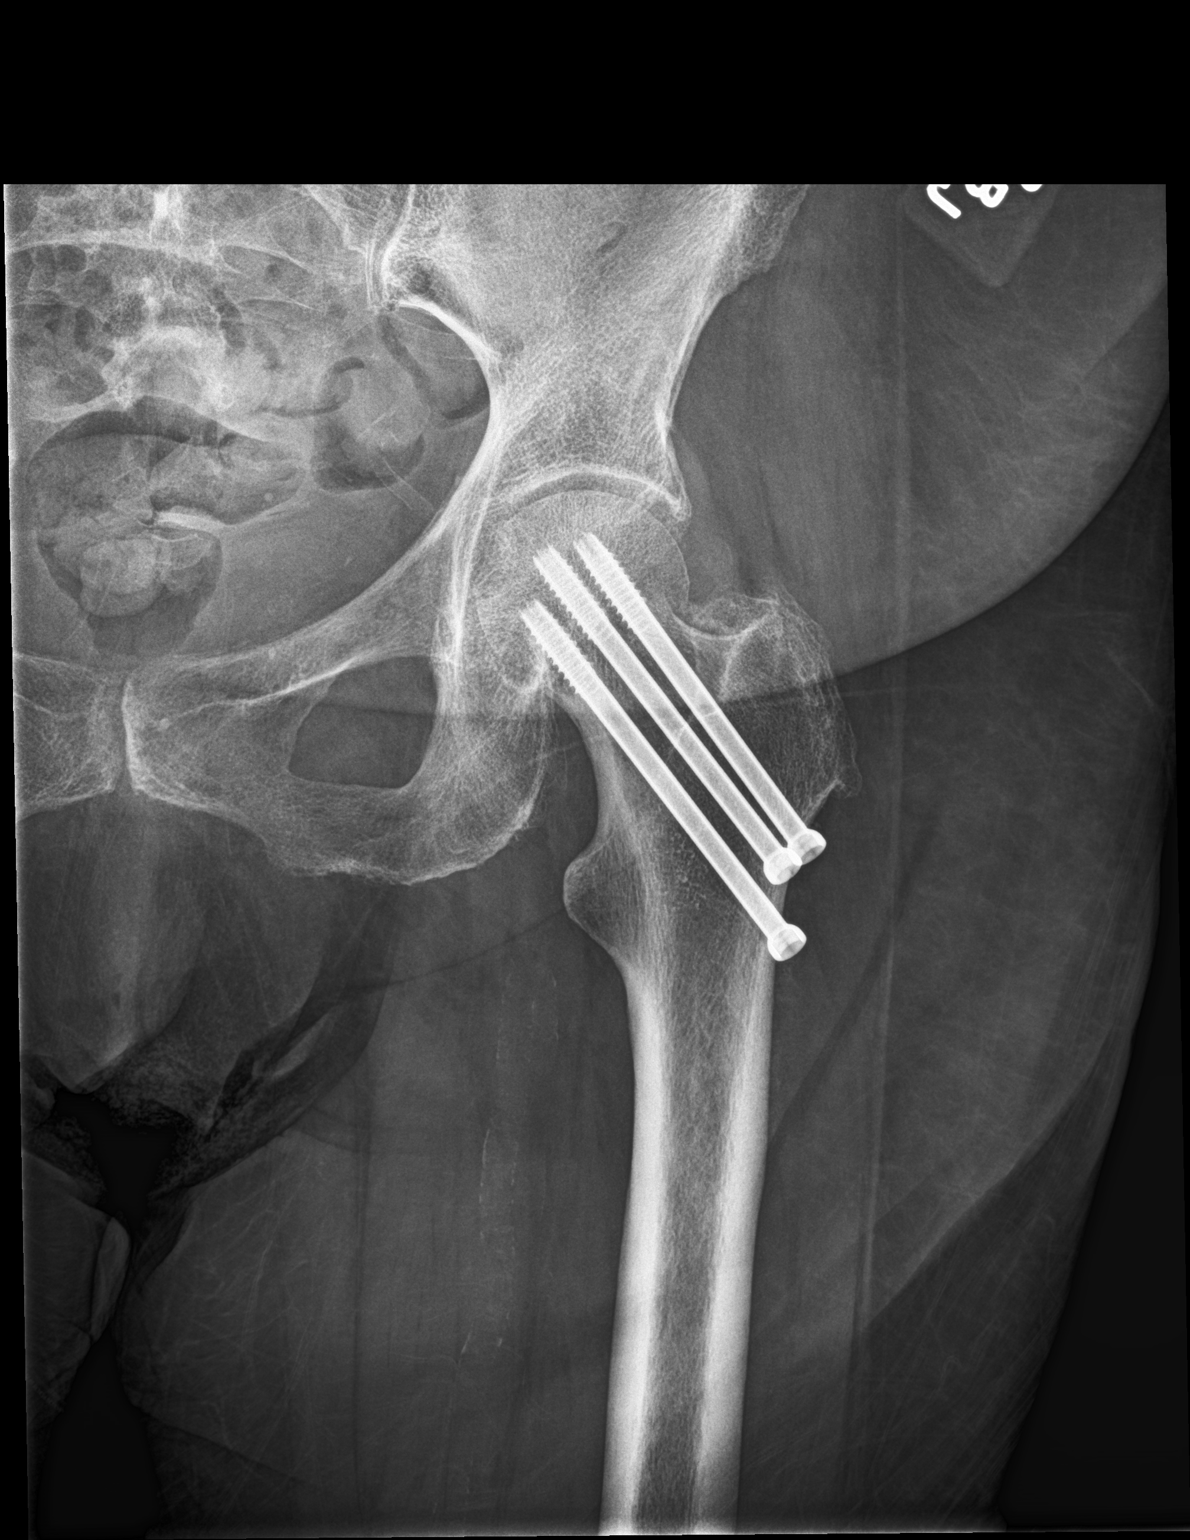

[hip lat]
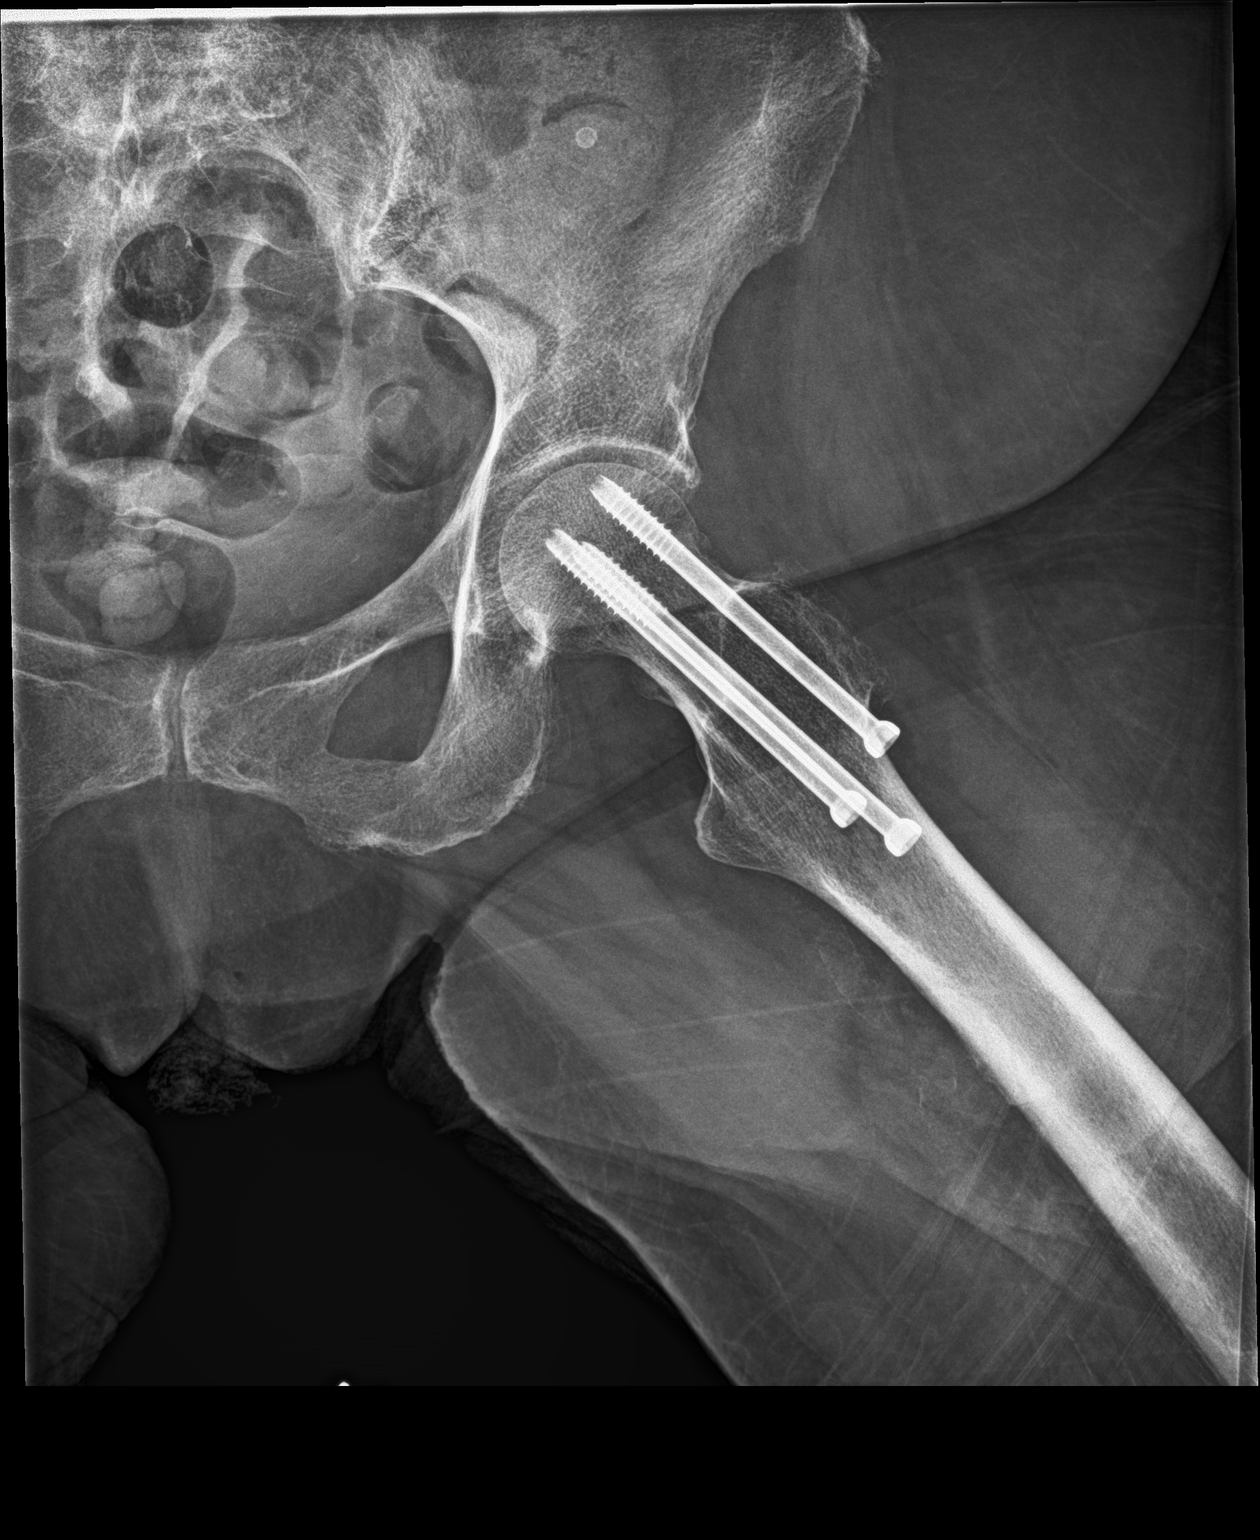

[3 of 3 positions shown; findings below may reference images not displayed]

FINDINGS: No acute bony or joint abnormality is identified. Postoperative
change of fixation of a subcapital left hip fracture identified.
Hardware is intact. Position and alignment are unchanged.
IMPRESSION: No acute abnormality.

## 2018-02-02 ENCOUNTER — Other Ambulatory Visit: Payer: Medicare Other

## 2018-02-07 ENCOUNTER — Ambulatory Visit: Payer: Medicare Other | Admitting: Neurology

## 2018-02-07 ENCOUNTER — Telehealth: Payer: Self-pay | Admitting: Neurology

## 2018-02-07 DIAGNOSIS — R55 Syncope and collapse: Secondary | ICD-10-CM

## 2018-02-07 DIAGNOSIS — G20 Parkinson's disease: Secondary | ICD-10-CM

## 2018-02-07 NOTE — Procedures (Signed)
    History:  Catherine [name] is an 84 year old patient with a history of an event of syncope associated with diaphoresis and nausea. The patient appeared to be blanched in the face, and she eventually regained consciousness after lying down. She is being evaluated for this event.  This is a routine EEG. No skull defects are noted. Medications include symmetrel, sinemet, colace, ativan, remeron, seroquel, trazodone, and senokot.  EEG classification: Normal awake  Description of the recording: The background rhythms of this recording consists of a fairly well modulated medium amplitude alpha rhythm of 8 Hz that is reactive to eye opening and closure. As the record progresses, the patient appears to remain in the waking state throughout the recording. Photic stimulation was performed, resulting in a bilateral and symmetric photic driving response. Hyperventilation was not performed. At no time during the recording does there appear to be evidence of spike or spike wave discharges or evidence of focal slowing. EKG monitor shows no evidence of cardiac rhythm abnormalities with a heart rate of 78.  Impression: This is a normal EEG recording in the waking state. No evidence of ictal or interictal discharges are seen.

## 2018-02-07 NOTE — Telephone Encounter (Signed)
I called the patient. The EEG study done was normal.

## 2018-03-13 ENCOUNTER — Encounter (HOSPITAL_COMMUNITY): Payer: Self-pay

## 2018-03-13 ENCOUNTER — Emergency Department (HOSPITAL_COMMUNITY): Payer: Medicare Other

## 2018-03-13 ENCOUNTER — Emergency Department (HOSPITAL_COMMUNITY)
Admission: EM | Admit: 2018-03-13 | Discharge: 2018-03-13 | Disposition: A | Discharge status: Home / Self Care | Payer: Medicare Other | Attending: Emergency Medicine | Admitting: Emergency Medicine

## 2018-03-13 ENCOUNTER — Other Ambulatory Visit: Payer: Self-pay

## 2018-03-13 DIAGNOSIS — E039 Hypothyroidism, unspecified: Secondary | ICD-10-CM | POA: Diagnosis not present

## 2018-03-13 DIAGNOSIS — R410 Disorientation, unspecified: Secondary | ICD-10-CM

## 2018-03-13 DIAGNOSIS — I10 Essential (primary) hypertension: Secondary | ICD-10-CM | POA: Diagnosis not present

## 2018-03-13 DIAGNOSIS — I2510 Atherosclerotic heart disease of native coronary artery without angina pectoris: Secondary | ICD-10-CM | POA: Insufficient documentation

## 2018-03-13 DIAGNOSIS — G3183 Dementia with Lewy bodies: Secondary | ICD-10-CM | POA: Insufficient documentation

## 2018-03-13 DIAGNOSIS — Z85828 Personal history of other malignant neoplasm of skin: Secondary | ICD-10-CM | POA: Diagnosis not present

## 2018-03-13 DIAGNOSIS — E860 Dehydration: Secondary | ICD-10-CM | POA: Diagnosis not present

## 2018-03-13 DIAGNOSIS — E119 Type 2 diabetes mellitus without complications: Secondary | ICD-10-CM | POA: Diagnosis not present

## 2018-03-13 HISTORY — DX: Supraventricular tachycardia, unspecified: I47.10

## 2018-03-13 HISTORY — DX: Hyperlipidemia, unspecified: E78.5

## 2018-03-13 HISTORY — DX: Unspecified dementia, unspecified severity, without behavioral disturbance, psychotic disturbance, mood disturbance, and anxiety: F03.90

## 2018-03-13 HISTORY — DX: Non-ST elevation (NSTEMI) myocardial infarction: I21.4

## 2018-03-13 HISTORY — DX: Supraventricular tachycardia: I47.1

## 2018-03-13 HISTORY — DX: Anemia, unspecified: D64.9

## 2018-03-13 LAB — TROPONIN I

## 2018-03-13 LAB — LACTIC ACID, PLASMA
LACTIC ACID, VENOUS: 1.2 mmol/L (ref 0.5–1.9)
LACTIC ACID, VENOUS: 1.4 mmol/L (ref 0.5–1.9)

## 2018-03-13 LAB — CBC
HCT: 42 % (ref 36.0–46.0)
Hemoglobin: 13.3 g/dL (ref 12.0–15.0)
MCH: 31.8 pg (ref 26.0–34.0)
MCHC: 31.7 g/dL (ref 30.0–36.0)
MCV: 100.5 fL — ABNORMAL HIGH (ref 80.0–100.0)
Platelets: 145 10*3/uL — ABNORMAL LOW (ref 150–400)
RBC: 4.18 MIL/uL (ref 3.87–5.11)
RDW: 12.7 % (ref 11.5–15.5)
WBC: 7.7 10*3/uL (ref 4.0–10.5)
nRBC: 0 % (ref 0.0–0.2)

## 2018-03-13 LAB — COMPREHENSIVE METABOLIC PANEL
ALT: 7 U/L (ref 0–44)
ANION GAP: 9 (ref 5–15)
AST: 17 U/L (ref 15–41)
Albumin: 4.4 g/dL (ref 3.5–5.0)
Alkaline Phosphatase: 67 U/L (ref 38–126)
BUN: 17 mg/dL (ref 8–23)
CO2: 26 mmol/L (ref 22–32)
Calcium: 9.4 mg/dL (ref 8.9–10.3)
Chloride: 109 mmol/L (ref 98–111)
Creatinine, Ser: 0.91 mg/dL (ref 0.44–1.00)
GFR, EST NON AFRICAN AMERICAN: 55 mL/min — AB (ref >60)
Glucose, Bld: 121 mg/dL — ABNORMAL HIGH (ref 70–99)
Potassium: 4.1 mmol/L (ref 3.5–5.1)
Sodium: 144 mmol/L (ref 135–145)
Total Bilirubin: 1.1 mg/dL (ref 0.3–1.2)
Total Protein: 7.4 g/dL (ref 6.5–8.1)

## 2018-03-13 LAB — URINALYSIS, ROUTINE W REFLEX MICROSCOPIC
Bilirubin Urine: NEGATIVE
Glucose, UA: NEGATIVE mg/dL
Hgb urine dipstick: NEGATIVE
KETONES UR: NEGATIVE mg/dL
LEUKOCYTES UA: NEGATIVE
NITRITE: NEGATIVE
PH: 6 (ref 5.0–8.0)
PROTEIN: NEGATIVE mg/dL
Specific Gravity, Urine: >1.03 — ABNORMAL HIGH (ref 1.005–1.030)

## 2018-03-13 LAB — CBG MONITORING, ED: Glucose-Capillary: 135 mg/dL — ABNORMAL HIGH (ref 70–99)

## 2018-03-13 MED ORDER — LACTATED RINGERS IV BOLUS
1000.0000 mL | Freq: Once | INTRAVENOUS | Status: AC
  Administered 2018-03-13: 1000 mL via INTRAVENOUS

## 2018-03-13 MED ORDER — ONDANSETRON HCL 4 MG/2ML IJ SOLN
4.0000 mg | Freq: Once | INTRAMUSCULAR | Status: AC
  Administered 2018-03-13: 4 mg via INTRAVENOUS
  Filled 2018-03-13: qty 2

## 2018-03-13 NOTE — ED Provider Notes (Signed)
Emergency Department Provider Note   I have reviewed the triage vital signs and the nursing notes.   HISTORY  Chief Complaint Altered Mental Status   HPI Catherine [name] is a 84 y.o. female who presents with a friend secondary to altered mental status.  Friend states that he sees her often on the weekends and was there yesterday from 7 in the morning to eat at night and the patient was not quite acting like herself with decreased appetite and not talking as much as normal and little bit more sleepy but then last night around 7:00 patient became more difficult to understand and was not speaking at all and more difficult to arouse.  He went to see her again today around 08/11/1928 and she had not eaten all day and she continued to act abnormal but was awake.  He asked her to be sent here for further evaluation.  He does not know of any recent illnesses but did state that she has a worsening wound on her sacrum.  No known fevers or other infectious symptoms.  On review of records appears patient has had multiple episodes like this in the past some of which have been transient some of which have improved with UTI medications. No other associated or modifying symptoms.   LEVEL V CAVEAT 2/2 DEMENTIA  Past Medical History:  Diagnosis Date  . Anemia   . Arthritis   . Cancer ([place])    basal cell-forehead  . Dementia ([place])   . Dementia in Parkinson's disease ([place]) 12/25/2015  . Diabetes mellitus without complication ([place])   . Dyslipidemia   . Hyperlipidemia   . Hypertension   . Migraine without aura, without mention of intractable migraine without mention of status migrainosus   . NSTEMI (non-ST elevated myocardial infarction) ([place])   . Paralysis agitans ([place]) 09/20/2012  . Parkinson's disease ([place])   . Sciatica of left side   . SVT (supraventricular tachycardia) ([place])   . Tremor     Patient Active Problem List   Diagnosis Date Noted  . Confusion and disorientation 02/03/2017  .  Hypothyroidism 02/03/2017  . Gait abnormality 07/15/2016  . Generalized weakness 04/04/2016  . CAD (coronary artery disease) 04/04/2016  . Pressure injury of skin 04/04/2016  . UTI (urinary tract infection) 01/04/2016  . Acute encephalopathy 01/02/2016  . NSTEMI (non-ST elevated myocardial infarction) ([place]) 01/02/2016  . HTN (hypertension) 01/02/2016  . Pressure ulcer 01/02/2016  . Altered mental status 01/01/2016  . Dementia in Parkinson's disease ([place]) 12/25/2015  . Parkinson's disease ([place]) 05/24/2015  . 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 Left displaced femoral neck fracture ([place]) 05/23/2015  . Closed left hip fracture ([place])   . Hip fracture ([place]) 05/22/2015  . Weakness 12/22/2014  . Ataxia 12/22/2014  . PAT (paroxysmal atrial tachycardia) ([place]) 01/25/2013  . Palpitations 01/11/2013  . Paralysis agitans ([place]) 09/20/2012  . Type 2 diabetes mellitus ([place]) 07/21/2011  . Hyperlipidemia 07/21/2011    Past Surgical History:  Procedure Laterality Date  . APPENDECTOMY    . Arthroscopic surgery     Left knee  . basal cell carcinoma resection     Forehead  . CATARACT EXTRACTION W/PHACO Right 11/19/2013   Procedure: CATARACT EXTRACTION PHACO AND INTRAOCULAR LENS PLACEMENT (IOC);  Surgeon: Tonny Branch, MD;  Location: [facility];  Service: Ophthalmology;  Laterality: Right;  CDE:  12.95  . CATARACT EXTRACTION W/PHACO Left 12/17/2013   Procedure: CATARACT EXTRACTION PHACO AND INTRAOCULAR LENS PLACEMENT (IOC);  Surgeon: Tonny Branch,  MD;  Location: [facility];  Service: Ophthalmology;  Laterality: Left;  CDE 12.10  . HIP PINNING,CANNULATED Left 05/23/2015   Procedure: INTERNAL FIXATION LEFT HIP;  Surgeon: Carole Civil, MD;  Location: [facility];  Service: Orthopedics;  Laterality: Left;  . MOUTH SURGERY    . TONSILLECTOMY    . trigger finger surgery     Bilateral thumb    Current Outpatient Rx  . Order #: [id number] Class: Historical Med  . Order #: [id number] Class: Print  . Order #:  [id number] Class: Normal  . Order #: [id number] Class: Historical Med  . Order #: [id number] Class: Historical Med  . Order #: [id number] Class: Historical Med  . Order #: [id number] Class: Print  . Order #: [id number] Class: Historical Med  . Order #: [id number] Class: Historical Med  . Order #: [id number] Class: Historical Med  . Order #: [id number] Class: Historical Med  . Order #: [id number] Class: Historical Med  . Order #: [id number] Class: Historical Med  . Order #: [id number] Class: Historical Med  . Order #: [id number] Class: Historical Med    Allergies Sulfa antibiotics; Azilect [rasagiline]; Fenofibrate; Other; Sinemet [carbidopa-levodopa]; Tape; and Tomato  Family History  Problem Relation Age of Onset  . Rheum arthritis Mother   . Heart attack Father        not certain of COD  . Hypertension Paternal Grandfather   . Stroke Paternal Grandfather   . Cirrhosis Paternal Grandmother     Social History Social History   Tobacco Use  . Smoking status: Never Smoker  . Smokeless tobacco: Never Used  Substance Use Topics  . Alcohol use: No    Alcohol/week: 0.0 standard drinks  . Drug use: No    Review of Systems  LEVEL V CAVEAT 2/2 DEMENTIA ____________________________________________   PHYSICAL EXAM:  VITAL SIGNS: ED Triage Vitals  Enc Vitals Group     BP 03/13/18 1733 (!) 142/119     Pulse Rate 03/13/18 1733 78     Resp 03/13/18 1733 20     Temp 03/13/18 1733 97.6 F (36.4 C)     Temp Source 03/13/18 1733 Oral     SpO2 03/13/18 1733 100 %   Constitutional: Alert and oriented to self. Well appearing and in no acute distress. Eyes: Conjunctivae are normal. PERRL. EOMI. Sunken eyes Head: Atraumatic. Nose: No congestion/rhinnorhea. Mouth/Throat: Mucous membranes are dry.  Oropharynx non-erythematous. Neck: No stridor.  No meningeal signs.   Cardiovascular: Normal rate, regular rhythm. Good peripheral circulation. Grossly normal heart sounds.   Respiratory: Normal respiratory  effort.  No retractions. Lungs CTAB. Gastrointestinal: Soft and nontender. No distention.  Musculoskeletal: No lower extremity tenderness nor edema. No gross deformities of extremities. Neurologic:  Normal speech and language. No gross focal neurologic deficits are appreciated.  Skin:  Skin is warm, dry with large ulcer to sacral area with two open areas, surrounding erythema and ecchymosis. no malodorous discharge, induration, warmth or signficant ttp. No rash noted.   ____________________________________________   LABS (all labs ordered are listed, but only abnormal results are displayed)  Labs Reviewed  COMPREHENSIVE METABOLIC PANEL - Abnormal; Notable for the following components:      Result Value   Glucose, Bld 121 (*)    GFR calc non Af Amer 55 (*)    All other components within normal limits  CBC - Abnormal; Notable for the following components:   MCV 100.5 (*)    Platelets 145 (*)    All other components within normal limits  URINALYSIS, ROUTINE W REFLEX MICROSCOPIC - Abnormal; Notable for the  following components:   Specific Gravity, Urine >1.030 (*)    All other components within normal limits  CBG MONITORING, ED - Abnormal; Notable for the following components:   Glucose-Capillary 135 (*)    All other components within normal limits  LACTIC ACID, PLASMA  LACTIC ACID, PLASMA  TROPONIN I   ____________________________________________  EKG   EKG Interpretation  Date/Time:  Monday March 13 2018 17:38:52 EST Ventricular Rate:  79 PR Interval:    QRS Duration: 99 QT Interval:  390 QTC Calculation: 448 R Axis:   -25 Text Interpretation:  Sinus rhythm Paired ventricular premature complexes Low voltage, extremity and precordial leads Abnormal R-wave progression, early transition with artifact, no obvious changes from 9/16 Confirmed by Merrily Pew [phone]) on 03/13/2018 6:33:09 PM       ____________________________________________  RADIOLOGY  Dg Chest 2  View  Result Date: 03/13/2018 CLINICAL DATA:  Altered mental status today. EXAM: CHEST - 2 VIEW COMPARISON:  01/23/2018 and prior exams FINDINGS: The cardiomediastinal silhouette is unremarkable. There is no evidence of focal airspace disease, pulmonary edema, suspicious pulmonary nodule/mass, pleural effusion, or pneumothorax. No acute bony abnormalities are identified. IMPRESSION: No active cardiopulmonary disease. Electronically Signed   By: Margarette Canada M.D.   On: 03/13/2018 19:22   Ct Head Wo Contrast  Result Date: 03/13/2018 CLINICAL DATA:  Altered mental status EXAM: CT HEAD WITHOUT CONTRAST TECHNIQUE: Contiguous axial images were obtained from the base of the skull through the vertex without intravenous contrast. COMPARISON:  CT brain 01/23/2018 FINDINGS: Brain: No acute territorial infarction, hemorrhage or intracranial mass. Moderate atrophy. Moderate small vessel ischemic changes of the white matter. Stable ventricle size. Vascular: No hyperdense vessels. Carotid vascular and vertebral artery calcification Skull: Normal. Negative for fracture or focal lesion. Sinuses/Orbits: No acute finding. Other: None IMPRESSION: 1. No CT evidence for acute intracranial abnormality. 2. Atrophy with small vessel ischemic changes of the white matter. Electronically Signed   By: Donavan Foil M.D.   On: 03/13/2018 19:52    ____________________________________________   PROCEDURES  Procedure(s) performed:   Procedures   ____________________________________________   INITIAL IMPRESSION / ASSESSMENT AND PLAN / ED COURSE  Unclear etiology. Awake and oriented at baseline here but does appear somewhat dry on exam. Possibly infectious? Overmedication? Just dehydration? Will eval appropriately. dispo appropriately. If goes home, will engage case management and wound care.   On reevaluation, patient caregiver in the room and states she seems to be at her normal level of consciousness at this time. Friend  returned who agreed.  Plan for discharge with case management consult to try and get wound care set up for sacral wound. otherwise no clear source of patient's AMS at this time but seems to have improved.     Pertinent labs & imaging results that were available during my care of the patient were reviewed by me and considered in my medical decision making (see chart for details).  ____________________________________________  FINAL CLINICAL IMPRESSION(S) / ED DIAGNOSES  Final diagnoses:  Dehydration  Confusion     MEDICATIONS GIVEN DURING THIS VISIT:  Medications  lactated ringers bolus 1,000 mL ( Intravenous Stopped 03/13/18 2018)  ondansetron (ZOFRAN) injection 4 mg (4 mg Intravenous Given 03/13/18 1834)     NEW OUTPATIENT MEDICATIONS STARTED DURING THIS VISIT:  Discharge Medication List as of 03/13/2018  9:16 PM      Note:  This note was prepared with assistance of Dragon voice recognition software. Occasional wrong-word or sound-a-like substitutions may have occurred due  to the inherent limitations of voice recognition software.   Merrily Pew, MD 03/13/18 [phone]

## 2018-03-13 NOTE — ED Triage Notes (Signed)
EMS reports was called out to [facility] because family said pt wasn't acting herself.  EMS unable to elaborate about what was different about pt.  Pt presently alert and oriented to self and place.  Pt denies pain.

## 2018-03-15 ENCOUNTER — Telehealth: Payer: Self-pay | Admitting: Neurology

## 2018-03-15 NOTE — Telephone Encounter (Signed)
I received a request through the extended care facility to increase his Sinemet due to increased twitching.  I am not sure what exactly what this means, the patient is demented, she is nonambulatory.  Increasing the Sinemet could result in adverse cognitive side effects.  I will need to see the patient back in the office before make a decision whether or not to increase the Sinemet.

## 2018-03-16 NOTE — Telephone Encounter (Signed)
Spoke with transporter at [facility] and gave appt. 03/17/18, arrival time of 1130 for a 1200 appt/fim

## 2018-03-17 ENCOUNTER — Encounter: Payer: Self-pay | Admitting: Neurology

## 2018-03-17 ENCOUNTER — Ambulatory Visit: Payer: Medicare Other | Admitting: Neurology

## 2018-03-17 ENCOUNTER — Other Ambulatory Visit: Payer: Self-pay

## 2018-03-17 VITALS — BP 106/68 | HR 72 | Resp 18 | Ht 61.0 in | Wt 110.0 lb

## 2018-03-17 DIAGNOSIS — F0280 Dementia in other diseases classified elsewhere without behavioral disturbance: Secondary | ICD-10-CM

## 2018-03-17 DIAGNOSIS — G20 Parkinson's disease: Secondary | ICD-10-CM | POA: Diagnosis not present

## 2018-03-17 MED ORDER — LORAZEPAM 0.5 MG PO TABS: ORAL_TABLET | ORAL | 0 refills | Status: DC

## 2018-03-17 NOTE — Patient Instructions (Signed)
Discontinue the Amantidine

## 2018-03-17 NOTE — Progress Notes (Signed)
Reason for visit: Parkinson's disease, dementia  Catherine [name] is an 84 y.o. female  History of present illness:  Catherine [name] is an 84 year old right-handed white female with a history of Parkinson's disease.  The patient is nonambulatory, she currently resides at [facility].  The patient has a sacral decubitus, stage II.  The patient is not eating well all the time, she was just in the emergency room on 13 March 2018 with altered mental status associated with dehydration.  The patient has been noted over the last 2 weeks to have occasional jerks or twitches.  She does not have tremor.  The patient has not had any falls.  She is brought back to the office for reevaluation.  A recent EEG study done through our office was normal in October.  Past Medical History:  Diagnosis Date  . Anemia   . Arthritis   . Cancer ([place])    basal cell-forehead  . Decubitus ulcer    sacral  . Dementia ([place])   . Dementia in Parkinson's disease ([place]) 12/25/2015  . Diabetes mellitus without complication ([place])   . Dyslipidemia   . Hyperlipidemia   . Hypertension   . Migraine without aura, without mention of intractable migraine without mention of status migrainosus   . NSTEMI (non-ST elevated myocardial infarction) ([place])   . Paralysis agitans ([place]) 09/20/2012  . Parkinson's disease ([place])   . Sciatica of left side   . SVT (supraventricular tachycardia) ([place])   . Tremor     Past Surgical History:  Procedure Laterality Date  . APPENDECTOMY    . Arthroscopic surgery     Left knee  . basal cell carcinoma resection     Forehead  . CATARACT EXTRACTION W/PHACO Right 11/19/2013   Procedure: CATARACT EXTRACTION PHACO AND INTRAOCULAR LENS PLACEMENT (IOC);  Surgeon: Tonny Branch, MD;  Location: [facility];  Service: Ophthalmology;  Laterality: Right;  CDE:  12.95  . CATARACT EXTRACTION W/PHACO Left 12/17/2013   Procedure: CATARACT EXTRACTION PHACO AND INTRAOCULAR LENS PLACEMENT (IOC);  Surgeon: Tonny Branch, MD;   Location: [facility];  Service: Ophthalmology;  Laterality: Left;  CDE 12.10  . HIP PINNING,CANNULATED Left 05/23/2015   Procedure: INTERNAL FIXATION LEFT HIP;  Surgeon: Carole Civil, MD;  Location: [facility];  Service: Orthopedics;  Laterality: Left;  . MOUTH SURGERY    . TONSILLECTOMY    . trigger finger surgery     Bilateral thumb    Family History  Problem Relation Age of Onset  . Rheum arthritis Mother   . Heart attack Father        not certain of COD  . Hypertension Paternal Grandfather   . Stroke Paternal Grandfather   . Cirrhosis Paternal Grandmother     Social history:  reports that she has never smoked. She has never used smokeless tobacco. She reports that she does not drink alcohol or use drugs.    Allergies  Allergen Reactions  . Sulfa Antibiotics Rash  . Azilect [Rasagiline] Rash  . Fenofibrate Rash  . Other     baza cream   . Sinemet [Carbidopa-Levodopa] Rash  . Tape Rash and Other (See Comments)    Redness  . Tomato Rash    Pt can eat raw tomatoes.Marland Kitchenate tomato sandwich a few days ago    Medications:  Prior to Admission medications   Medication Sig Start Date End Date Taking? Authorizing Provider  acetaminophen (TYLENOL) 325 MG tablet Take 650 mg by mouth every 6 (six) hours  as needed.   Yes [provider]  carbidopa-levodopa (SINEMET IR) 25-100 MG tablet Take 0.5 tablets by mouth 3 (three) times daily. 09/08/17  Yes Dennie Bible, NP  cetirizine (ZYRTEC) 10 MG tablet Take 10 mg by mouth daily.   Yes [provider]  docusate sodium (COLACE) 100 MG capsule Take 100 mg by mouth 2 (two) times daily.   Yes [provider]  lactose free nutrition (BOOST) LIQD Take 1 Container by mouth 2 (two) times daily between meals.   Yes [provider]  LORazepam (ATIVAN) 0.5 MG tablet 1 tablet at night, take 1 tab in the morning if needed 03/17/18  Yes Kathrynn Ducking, MD  mirtazapine (REMERON) 7.5 MG tablet Take 7.5 mg by mouth  at bedtime.   Yes [provider]  Multiple Vitamin (MULTIVITAMIN) capsule Take 1 capsule by mouth daily.   Yes [provider]  ondansetron (ZOFRAN) 4 MG tablet Take 4 mg by mouth every 6 (six) hours as needed for nausea or vomiting.    Yes [provider]  polyethylene glycol (MIRALAX / GLYCOLAX) packet Take 17 g by mouth daily.    Yes [provider]  QUEtiapine (SEROQUEL) 50 MG tablet Take 50 mg by mouth See admin instructions. 25 mg in the morning, 75 mg at night 07/01/16  Yes [provider]  senna (SENOKOT) 8.6 MG TABS tablet Take 1 tablet by mouth at bedtime.   Yes [provider]  Sodium Phosphates (ENEMA DISPOSABLE RE) Place 1 application rectally 2 (two) times daily as needed. As needed for constipation twice weekly   Yes [provider]  traZODone (DESYREL) 50 MG tablet Take 25 mg by mouth at bedtime.    Yes [provider]    ROS:  Out of a complete 14 system review of symptoms, the patient complains only of the following symptoms, and all other reviewed systems are negative.  Decreased appetite Runny nose Constipation Sleep talking Skin wounds Speech difficulty, tremors Confusion  Blood pressure 106/68, pulse 72, resp. rate 18, height 5\' 1"  (1.549 m), weight 110 lb (49.9 kg).  Physical Exam  General: The patient is alert and cooperative at the time of the examination.  Skin: No significant peripheral edema is noted.   Neurologic Exam  Mental status: The patient is sleepy, she has been re-alerted frequently during the exam.  The patient is oriented to name, not to place or date.   Cranial nerves: Facial symmetry is present. Speech is hypophonic, not a phasic.  Extraocular movements are full. Visual fields are full.  Motor: The patient has good strength in all 4 extremities.  Sensory examination: Soft touch sensation is symmetric on the face, arms, and legs.  Coordination: The patient has the  ability to perform finger-nose-finger bilaterally, she cannot perform heel-to-shin on either side.  Gait and station: The patient is wheelchair-bound, she is nonambulatory.  Reflexes: Deep tendon reflexes are symmetric.   Assessment/Plan:  1.  Parkinson's disease  2.  Dementia  3.  Nonambulatory state  Clinical examination today does not show any myoclonic jerks or asterixis.  The patient is quite drowsy however.  We will alter that medication regimen, we will stop the amantadine, the Seroquel dose will be reduced to 25 mg in the morning, the Ativan dose will be converted to PRN in the morning.  The patient will follow-up for the next scheduled revisit in March 2020.   The telephone number for Nanine Means is [phone].  The fax  number is [phone].  Jill Alexanders MD 03/17/2018 12:39 PM  [facility] [address] [place], [place] [address]  Phone [phone] Fax [phone]

## 2018-03-20 ENCOUNTER — Other Ambulatory Visit: Payer: Self-pay

## 2018-03-20 ENCOUNTER — Encounter (HOSPITAL_COMMUNITY): Payer: Self-pay

## 2018-03-20 ENCOUNTER — Emergency Department (HOSPITAL_COMMUNITY): Payer: Medicare Other

## 2018-03-20 ENCOUNTER — Inpatient Hospital Stay (HOSPITAL_COMMUNITY)
Admission: EM | Admit: 2018-03-20 | Discharge: 2018-03-27 | DRG: 871 | Disposition: A | Discharge status: Hospice | Payer: Medicare Other | Attending: Internal Medicine | Admitting: Internal Medicine

## 2018-03-20 DIAGNOSIS — N179 Acute kidney failure, unspecified: Secondary | ICD-10-CM | POA: Diagnosis not present

## 2018-03-20 DIAGNOSIS — Z882 Allergy status to sulfonamides status: Secondary | ICD-10-CM

## 2018-03-20 DIAGNOSIS — G3183 Dementia with Lewy bodies: Secondary | ICD-10-CM | POA: Diagnosis present

## 2018-03-20 DIAGNOSIS — N390 Urinary tract infection, site not specified: Secondary | ICD-10-CM | POA: Diagnosis not present

## 2018-03-20 DIAGNOSIS — R402343 Coma scale, best motor response, flexion withdrawal, at hospital admission: Secondary | ICD-10-CM | POA: Diagnosis present

## 2018-03-20 DIAGNOSIS — Z515 Encounter for palliative care: Secondary | ICD-10-CM | POA: Diagnosis not present

## 2018-03-20 DIAGNOSIS — F329 Major depressive disorder, single episode, unspecified: Secondary | ICD-10-CM | POA: Diagnosis present

## 2018-03-20 DIAGNOSIS — Z8639 Personal history of other endocrine, nutritional and metabolic disease: Secondary | ICD-10-CM

## 2018-03-20 DIAGNOSIS — R531 Weakness: Secondary | ICD-10-CM | POA: Diagnosis present

## 2018-03-20 DIAGNOSIS — R402123 Coma scale, eyes open, to pain, at hospital admission: Secondary | ICD-10-CM | POA: Diagnosis present

## 2018-03-20 DIAGNOSIS — F419 Anxiety disorder, unspecified: Secondary | ICD-10-CM | POA: Diagnosis present

## 2018-03-20 DIAGNOSIS — E119 Type 2 diabetes mellitus without complications: Secondary | ICD-10-CM

## 2018-03-20 DIAGNOSIS — I10 Essential (primary) hypertension: Secondary | ICD-10-CM | POA: Diagnosis present

## 2018-03-20 DIAGNOSIS — Z66 Do not resuscitate: Secondary | ICD-10-CM | POA: Diagnosis present

## 2018-03-20 DIAGNOSIS — G20A1 Parkinson's disease without dyskinesia, without mention of fluctuations: Secondary | ICD-10-CM | POA: Diagnosis present

## 2018-03-20 DIAGNOSIS — A419 Sepsis, unspecified organism: Secondary | ICD-10-CM | POA: Diagnosis not present

## 2018-03-20 DIAGNOSIS — F0280 Dementia in other diseases classified elsewhere without behavioral disturbance: Secondary | ICD-10-CM | POA: Diagnosis present

## 2018-03-20 DIAGNOSIS — R402223 Coma scale, best verbal response, incomprehensible words, at hospital admission: Secondary | ICD-10-CM | POA: Diagnosis present

## 2018-03-20 DIAGNOSIS — R4182 Altered mental status, unspecified: Secondary | ICD-10-CM | POA: Diagnosis not present

## 2018-03-20 DIAGNOSIS — Z7189 Other specified counseling: Secondary | ICD-10-CM | POA: Diagnosis not present

## 2018-03-20 DIAGNOSIS — G9341 Metabolic encephalopathy: Secondary | ICD-10-CM | POA: Diagnosis present

## 2018-03-20 DIAGNOSIS — L89610 Pressure ulcer of right heel, unstageable: Secondary | ICD-10-CM | POA: Diagnosis not present

## 2018-03-20 DIAGNOSIS — R509 Fever, unspecified: Secondary | ICD-10-CM | POA: Diagnosis not present

## 2018-03-20 DIAGNOSIS — R6520 Severe sepsis without septic shock: Secondary | ICD-10-CM | POA: Diagnosis not present

## 2018-03-20 DIAGNOSIS — L89156 Pressure-induced deep tissue damage of sacral region: Secondary | ICD-10-CM | POA: Diagnosis not present

## 2018-03-20 DIAGNOSIS — Z91018 Allergy to other foods: Secondary | ICD-10-CM

## 2018-03-20 DIAGNOSIS — E039 Hypothyroidism, unspecified: Secondary | ICD-10-CM | POA: Diagnosis present

## 2018-03-20 DIAGNOSIS — Z993 Dependence on wheelchair: Secondary | ICD-10-CM

## 2018-03-20 DIAGNOSIS — D696 Thrombocytopenia, unspecified: Secondary | ICD-10-CM | POA: Diagnosis present

## 2018-03-20 DIAGNOSIS — Z7401 Bed confinement status: Secondary | ICD-10-CM

## 2018-03-20 DIAGNOSIS — I471 Supraventricular tachycardia: Secondary | ICD-10-CM | POA: Diagnosis present

## 2018-03-20 DIAGNOSIS — Z79899 Other long term (current) drug therapy: Secondary | ICD-10-CM

## 2018-03-20 DIAGNOSIS — L89150 Pressure ulcer of sacral region, unstageable: Secondary | ICD-10-CM | POA: Diagnosis present

## 2018-03-20 DIAGNOSIS — N3090 Cystitis, unspecified without hematuria: Secondary | ICD-10-CM | POA: Diagnosis present

## 2018-03-20 DIAGNOSIS — N3000 Acute cystitis without hematuria: Secondary | ICD-10-CM | POA: Diagnosis not present

## 2018-03-20 DIAGNOSIS — Z91048 Other nonmedicinal substance allergy status: Secondary | ICD-10-CM

## 2018-03-20 DIAGNOSIS — L89620 Pressure ulcer of left heel, unstageable: Secondary | ICD-10-CM | POA: Diagnosis present

## 2018-03-20 DIAGNOSIS — F4541 Pain disorder exclusively related to psychological factors: Secondary | ICD-10-CM | POA: Diagnosis present

## 2018-03-20 DIAGNOSIS — I4719 Other supraventricular tachycardia: Secondary | ICD-10-CM | POA: Diagnosis present

## 2018-03-20 DIAGNOSIS — I252 Old myocardial infarction: Secondary | ICD-10-CM

## 2018-03-20 DIAGNOSIS — G20 Parkinson's disease: Secondary | ICD-10-CM | POA: Diagnosis present

## 2018-03-20 DIAGNOSIS — Z888 Allergy status to other drugs, medicaments and biological substances status: Secondary | ICD-10-CM

## 2018-03-20 LAB — I-STAT CG4 LACTIC ACID, ED: Lactic Acid, Venous: 2.62 mmol/L (ref 0.5–1.9)

## 2018-03-20 LAB — COMPREHENSIVE METABOLIC PANEL
ALT: 19 U/L (ref 0–44)
ANION GAP: 14 (ref 5–15)
AST: 23 U/L (ref 15–41)
Albumin: 3.7 g/dL (ref 3.5–5.0)
Alkaline Phosphatase: 74 U/L (ref 38–126)
BUN: 26 mg/dL — ABNORMAL HIGH (ref 8–23)
CO2: 21 mmol/L — AB (ref 22–32)
CREATININE: 1.44 mg/dL — AB (ref 0.44–1.00)
Calcium: 9.2 mg/dL (ref 8.9–10.3)
Chloride: 109 mmol/L (ref 98–111)
GFR calc Af Amer: 36 mL/min — ABNORMAL LOW (ref >60)
GFR calc non Af Amer: 31 mL/min — ABNORMAL LOW (ref >60)
Glucose, Bld: 243 mg/dL — ABNORMAL HIGH (ref 70–99)
Potassium: 3.8 mmol/L (ref 3.5–5.1)
SODIUM: 144 mmol/L (ref 135–145)
Total Bilirubin: 1.2 mg/dL (ref 0.3–1.2)
Total Protein: 6.9 g/dL (ref 6.5–8.1)

## 2018-03-20 LAB — CBC WITH DIFFERENTIAL/PLATELET
Abs Immature Granulocytes: 0.08 10*3/uL — ABNORMAL HIGH (ref 0.00–0.07)
BASOS ABS: 0 10*3/uL (ref 0.0–0.1)
Basophils Relative: 0 %
EOS PCT: 0 %
Eosinophils Absolute: 0 10*3/uL (ref 0.0–0.5)
HEMATOCRIT: 43.1 % (ref 36.0–46.0)
Hemoglobin: 14.7 g/dL (ref 12.0–15.0)
IMMATURE GRANULOCYTES: 1 %
LYMPHS ABS: 0.9 10*3/uL (ref 0.7–4.0)
Lymphocytes Relative: 6 %
MCH: 32.8 pg (ref 26.0–34.0)
MCHC: 34.1 g/dL (ref 30.0–36.0)
MCV: 96.2 fL (ref 80.0–100.0)
Monocytes Absolute: 0.6 10*3/uL (ref 0.1–1.0)
Monocytes Relative: 4 %
NEUTROS PCT: 89 %
NRBC: 0 % (ref 0.0–0.2)
Neutro Abs: 12.8 10*3/uL — ABNORMAL HIGH (ref 1.7–7.7)
Platelets: 217 10*3/uL (ref 150–400)
RBC: 4.48 MIL/uL (ref 3.87–5.11)
RDW: 13 % (ref 11.5–15.5)
WBC: 14.4 10*3/uL — AB (ref 4.0–10.5)

## 2018-03-20 LAB — LACTIC ACID, PLASMA
LACTIC ACID, VENOUS: 1.2 mmol/L (ref 0.5–1.9)
Lactic Acid, Venous: 2.1 mmol/L (ref 0.5–1.9)

## 2018-03-20 LAB — URINALYSIS, ROUTINE W REFLEX MICROSCOPIC
BILIRUBIN URINE: NEGATIVE
GLUCOSE, UA: NEGATIVE mg/dL
KETONES UR: 20 mg/dL — AB
NITRITE: POSITIVE — AB
PH: 5 (ref 5.0–8.0)
Protein, ur: 30 mg/dL — AB
SPECIFIC GRAVITY, URINE: 1.019 (ref 1.005–1.030)

## 2018-03-20 LAB — INFLUENZA PANEL BY PCR (TYPE A & B)
INFLAPCR: NEGATIVE
Influenza B By PCR: NEGATIVE

## 2018-03-20 LAB — PROTIME-INR
INR: 1.3
PROTHROMBIN TIME: 16.1 s — AB (ref 11.4–15.2)

## 2018-03-20 LAB — MRSA PCR SCREENING: MRSA by PCR: NEGATIVE

## 2018-03-20 LAB — TSH: TSH: 0.981 u[IU]/mL (ref 0.350–4.500)

## 2018-03-20 MED ORDER — CARBIDOPA-LEVODOPA 25-100 MG PO TABS
0.5000 | ORAL_TABLET | Freq: Three times a day (TID) | ORAL | Status: DC
  Administered 2018-03-20: 0.5 via ORAL
  Filled 2018-03-20: qty 1

## 2018-03-20 MED ORDER — HEPARIN SODIUM (PORCINE) 5000 UNIT/ML IJ SOLN
5000.0000 [IU] | Freq: Three times a day (TID) | INTRAMUSCULAR | Status: DC
  Administered 2018-03-20 – 2018-03-21 (×3): 5000 [IU] via SUBCUTANEOUS
  Filled 2018-03-20 (×3): qty 1

## 2018-03-20 MED ORDER — INFLUENZA VAC SPLIT HIGH-DOSE 0.5 ML IM SUSY
0.5000 mL | PREFILLED_SYRINGE | INTRAMUSCULAR | Status: DC
  Filled 2018-03-20: qty 0.5

## 2018-03-20 MED ORDER — SODIUM CHLORIDE 0.9 % IV SOLN
1.0000 g | INTRAVENOUS | Status: DC
  Administered 2018-03-21 – 2018-03-22 (×2): 1 g via INTRAVENOUS
  Filled 2018-03-20 (×3): qty 1

## 2018-03-20 MED ORDER — ONDANSETRON HCL 4 MG/2ML IJ SOLN: 4.0000 mg | Freq: Four times a day (QID) | INTRAMUSCULAR | Status: DC | PRN

## 2018-03-20 MED ORDER — VANCOMYCIN HCL IN DEXTROSE 1-5 GM/200ML-% IV SOLN
1000.0000 mg | Freq: Once | INTRAVENOUS | Status: AC
  Administered 2018-03-20: 1000 mg via INTRAVENOUS
  Filled 2018-03-20: qty 200

## 2018-03-20 MED ORDER — AMANTADINE HCL 100 MG PO CAPS
100.0000 mg | ORAL_CAPSULE | Freq: Two times a day (BID) | ORAL | Status: DC
  Administered 2018-03-20: 100 mg via ORAL
  Filled 2018-03-20 (×8): qty 1

## 2018-03-20 MED ORDER — DOCUSATE SODIUM 100 MG PO CAPS
100.0000 mg | ORAL_CAPSULE | Freq: Two times a day (BID) | ORAL | Status: DC
  Administered 2018-03-20: 100 mg via ORAL
  Filled 2018-03-20: qty 1

## 2018-03-20 MED ORDER — SODIUM CHLORIDE 0.9 % IV SOLN
INTRAVENOUS | Status: DC
  Administered 2018-03-20: 17:00:00 via INTRAVENOUS

## 2018-03-20 MED ORDER — SENNA 8.6 MG PO TABS
1.0000 | ORAL_TABLET | Freq: Every day | ORAL | Status: DC
  Administered 2018-03-20: 8.6 mg via ORAL
  Filled 2018-03-20: qty 1

## 2018-03-20 MED ORDER — ONDANSETRON HCL 4 MG PO TABS: 4.0000 mg | ORAL_TABLET | Freq: Four times a day (QID) | ORAL | Status: DC | PRN

## 2018-03-20 MED ORDER — LORATADINE 10 MG PO TABS: 10.0000 mg | ORAL_TABLET | Freq: Every day | ORAL | Status: DC

## 2018-03-20 MED ORDER — SODIUM CHLORIDE 0.9 % IV BOLUS (SEPSIS)
1000.0000 mL | Freq: Once | INTRAVENOUS | Status: AC
  Administered 2018-03-20: 1000 mL via INTRAVENOUS

## 2018-03-20 MED ORDER — ACETAMINOPHEN 650 MG RE SUPP
RECTAL | Status: AC
  Administered 2018-03-20: 650 mg
  Filled 2018-03-20: qty 1

## 2018-03-20 MED ORDER — POLYETHYLENE GLYCOL 3350 17 G PO PACK: 17.0000 g | PACK | Freq: Every day | ORAL | Status: DC

## 2018-03-20 MED ORDER — METRONIDAZOLE IN NACL 5-0.79 MG/ML-% IV SOLN
500.0000 mg | Freq: Three times a day (TID) | INTRAVENOUS | Status: DC
  Administered 2018-03-20 – 2018-03-22 (×7): 500 mg via INTRAVENOUS
  Filled 2018-03-20 (×7): qty 100

## 2018-03-20 MED ORDER — TRAMADOL HCL 50 MG PO TABS: 50.0000 mg | ORAL_TABLET | Freq: Four times a day (QID) | ORAL | Status: DC | PRN

## 2018-03-20 MED ORDER — SODIUM CHLORIDE 0.9 % IV BOLUS (SEPSIS)
500.0000 mL | Freq: Once | INTRAVENOUS | Status: AC
  Administered 2018-03-20: 500 mL via INTRAVENOUS

## 2018-03-20 MED ORDER — SODIUM CHLORIDE 0.9 % IV SOLN
2.0000 g | Freq: Once | INTRAVENOUS | Status: AC
  Administered 2018-03-20: 2 g via INTRAVENOUS
  Filled 2018-03-20: qty 2

## 2018-03-20 NOTE — Progress Notes (Signed)
Pharmacy Antibiotic Note  DANAE OLAND is a 84 y.o. female admitted on 03/20/2018 with pneumonia and UTI.  Pharmacy has been consulted for vancomycin and cefepime  dosing.  Plan: Start cefepime 1g IV q24h Loading dose:  vancomycin 1g IV x1 dose Maintenance dose:   To be determined (when/if renal fx improves) Goal vancomycin trough range:  15-20  mcg/mL Pharmacy will continue to monitor renal function, vancomycin troughs, cultures and patient progress.   Height: 5\' 1"  (154.9 cm) Weight: 109 lb 15.8 oz (49.9 kg) IBW/kg (Calculated) : 47.8  Temp (24hrs), Avg:101.7 F (38.7 C), Min:98.7 F (37.1 C), Max:104.7 F (40.4 C)  Recent Labs  Lab 03/13/18 1828 03/13/18 1831 03/13/18 2044 03/20/18 1234 03/20/18 1255 03/20/18 1431  WBC  --  7.7  --  14.4*  --   --   CREATININE  --  0.91  --  1.44*  --   --   LATICACIDVEN 1.2  --  1.4 2.1* 2.62* 1.2    Estimated Creatinine Clearance: 20.4 mL/min (A) (by C-G formula based on SCr of 1.44 mg/dL (H)).    Allergies  Allergen Reactions  . Sulfa Antibiotics Rash  . Azilect [Rasagiline] Rash  . Fenofibrate Rash  . Other     baza cream   . Sinemet [Carbidopa-Levodopa] Rash  . Tape Rash and Other (See Comments)    Redness  . Tomato Rash    Pt can eat raw tomatoes.Marland Kitchenate tomato sandwich a few days ago    Antimicrobials this admission: 11/11 metronidazole >>   11/11 cefepime>>  11/11 vancomycin >>      Microbiology results: 11/11 BCx2:   11/11 MRSA PCR: pending    Thank you for allowing pharmacy to be a part of this patient's care.  Despina Pole 03/20/2018 5:03 PM

## 2018-03-20 NOTE — ED Triage Notes (Signed)
Pt brought in by EMS due to fever and UTI. PT is a  Resident  Of [facility] and was seen at [facility] yesterday and dx with UTI no abt given. Pt with 103 temp

## 2018-03-20 NOTE — ED Provider Notes (Signed)
[facility] EMERGENCY DEPARTMENT Provider Note   CSN: [id number] Arrival date & time: 03/20/18  1221     History   Chief Complaint Chief Complaint  Patient presents with  . Fever    Catherine [name] is a 84 y.o. female.  Level 5 caveat for altered mental status.  Patient resides at [facility] and has significant Parkinson's and dementia issues.  She is normally alert, but mostly bedridden.  She was seen at [facility] in the [facility] yesterday in the emergency department.  Family was told that she had a "urinary tract infection", but no antibiotics were administered.  Today she is febrile with decreased level consciousness.  Review of systems positive for significant sacral decubiti     Past Medical History:  Diagnosis Date  . Anemia   . Arthritis   . Cancer ([place])    basal cell-forehead  . Decubitus ulcer    sacral  . Dementia ([place])   . Dementia in Parkinson's disease ([place]) 12/25/2015  . Diabetes mellitus without complication ([place])   . Dyslipidemia   . Hyperlipidemia   . Hypertension   . Migraine without aura, without mention of intractable migraine without mention of status migrainosus   . NSTEMI (non-ST elevated myocardial infarction) ([place])   . Paralysis agitans ([place]) 09/20/2012  . Parkinson's disease ([place])   . Sciatica of left side   . SVT (supraventricular tachycardia) ([place])   . Tremor     Patient Active Problem List   Diagnosis Date Noted  . Confusion and disorientation 02/03/2017  . Hypothyroidism 02/03/2017  . Gait abnormality 07/15/2016  . Generalized weakness 04/04/2016  . CAD (coronary artery disease) 04/04/2016  . Pressure injury of skin 04/04/2016  . UTI (urinary tract infection) 01/04/2016  . Acute encephalopathy 01/02/2016  . NSTEMI (non-ST elevated myocardial infarction) ([place]) 01/02/2016  . HTN (hypertension) 01/02/2016  . Pressure ulcer 01/02/2016  . Altered mental status 01/01/2016  . Dementia in Parkinson's disease ([place])  12/25/2015  . Parkinson's disease ([place]) 05/24/2015  . Parkinsonian tremor ([place]) 05/24/2015  . Left displaced femoral neck fracture ([place]) 05/23/2015  . Closed left hip fracture ([place])   . Hip fracture ([place]) 05/22/2015  . Weakness 12/22/2014  . Ataxia 12/22/2014  . PAT (paroxysmal atrial tachycardia) ([place]) 01/25/2013  . Palpitations 01/11/2013  . Paralysis agitans ([place]) 09/20/2012  . Type 2 diabetes mellitus ([place]) 07/21/2011  . Hyperlipidemia 07/21/2011    Past Surgical History:  Procedure Laterality Date  . APPENDECTOMY    . Arthroscopic surgery     Left knee  . basal cell carcinoma resection     Forehead  . CATARACT EXTRACTION W/PHACO Right 11/19/2013   Procedure: CATARACT EXTRACTION PHACO AND INTRAOCULAR LENS PLACEMENT (IOC);  Surgeon: Tonny Branch, MD;  Location: [facility] ORS;  Service: Ophthalmology;  Laterality: Right;  CDE:  12.95  . CATARACT EXTRACTION W/PHACO Left 12/17/2013   Procedure: CATARACT EXTRACTION PHACO AND INTRAOCULAR LENS PLACEMENT (IOC);  Surgeon: Tonny Branch, MD;  Location: [facility] ORS;  Service: Ophthalmology;  Laterality: Left;  CDE 12.10  . HIP PINNING,CANNULATED Left 05/23/2015   Procedure: INTERNAL FIXATION LEFT HIP;  Surgeon: Carole Civil, MD;  Location: [facility] ORS;  Service: Orthopedics;  Laterality: Left;  . MOUTH SURGERY    . TONSILLECTOMY    . trigger finger surgery     Bilateral thumb     OB History   None      Home Medications    Prior to Admission medications   Medication Sig  Start Date End Date Taking? Authorizing Provider  acetaminophen (TYLENOL) 325 MG tablet Take 650 mg by mouth every 6 (six) hours as needed.   Yes [provider]  amantadine (SYMMETREL) 100 MG capsule Take 100 mg by mouth 2 (two) times daily.   Yes [provider]  carbidopa-levodopa (SINEMET IR) 25-100 MG tablet Take 0.5 tablets by mouth 3 (three) times daily. 09/08/17  Yes Dennie Bible, NP  cetirizine (ZYRTEC) 10 MG tablet Take 10 mg by mouth daily.    Yes [provider]  docusate sodium (COLACE) 100 MG capsule Take 100 mg by mouth 2 (two) times daily.   Yes [provider]  lactose free nutrition (BOOST) LIQD Take 1 Container by mouth 2 (two) times daily between meals.   Yes [provider]  LORazepam (ATIVAN) 0.5 MG tablet 1 tablet at night, take 1 tab in the morning if needed 03/17/18  Yes Kathrynn Ducking, MD  mirtazapine (REMERON) 7.5 MG tablet Take 7.5 mg by mouth at bedtime.   Yes [provider]  Multiple Vitamin (MULTIVITAMIN) capsule Take 1 capsule by mouth daily.   Yes [provider]  ondansetron (ZOFRAN) 4 MG tablet Take 4 mg by mouth every 6 (six) hours as needed for nausea or vomiting.    Yes [provider]  polyethylene glycol (MIRALAX / GLYCOLAX) packet Take 17 g by mouth daily.    Yes [provider]  QUEtiapine (SEROQUEL) 50 MG tablet Take 50 mg by mouth See admin instructions. 25 mg in the morning, 75 mg at night 07/01/16  Yes [provider]  senna (SENOKOT) 8.6 MG TABS tablet Take 1 tablet by mouth at bedtime.   Yes [provider]  Sodium Phosphates (ENEMA DISPOSABLE RE) Place 1 application rectally 2 (two) times daily as needed. As needed for constipation twice weekly   Yes [provider]  traZODone (DESYREL) 50 MG tablet Take 25 mg by mouth at bedtime.    Yes [provider]    Family History Family History  Problem Relation Age of Onset  . Rheum arthritis Mother   . Heart attack Father        not certain of COD  . Hypertension Paternal Grandfather   . Stroke Paternal Grandfather   . Cirrhosis Paternal Grandmother     Social History Social History   Tobacco Use  . Smoking status: Never Smoker  . Smokeless tobacco: Never Used  Substance Use Topics  . Alcohol use: No    Alcohol/week: 0.0 standard drinks  . Drug use: No     Allergies   Sulfa antibiotics; Azilect [rasagiline]; Fenofibrate; Other; Sinemet  [carbidopa-levodopa]; Tape; and Tomato   Review of Systems Review of Systems  Unable to perform ROS: Acuity of condition     Physical Exam Updated Vital Signs BP 131/65   Pulse (!) 101   Temp (!) 104.7 F (40.4 C) (Rectal)   Resp (!) 27   Ht 5\' 1"  (1.549 m)   Wt 49.9 kg   SpO2 92%   BMI 20.78 kg/m   Physical Exam  Constitutional: She is oriented to person, place, and time.  Obtunded  HENT:  Head: Normocephalic and atraumatic.  Eyes: Conjunctivae are normal.  Neck: Neck supple.  Cardiovascular: Regular rhythm.  Tachycardic  Pulmonary/Chest: Effort normal and breath sounds normal.  Abdominal: Soft. Bowel sounds are normal.  Musculoskeletal: Normal range of motion.  Neurological: She is alert and oriented to person, place, and time.  Skin:  Stage II/III sacral decubiti  Psychiatric: She has a normal mood and affect. Her behavior is normal.  Nursing note and vitals reviewed.    ED Treatments / Results  Labs (all labs ordered are listed, but only abnormal results are displayed) Labs Reviewed  COMPREHENSIVE METABOLIC PANEL - Abnormal; Notable for the following components:      Result Value   CO2 21 (*)    Glucose, Bld 243 (*)    BUN 26 (*)    Creatinine, Ser 1.44 (*)    GFR calc non Af Amer 31 (*)    GFR calc Af Amer 36 (*)    All other components within normal limits  CBC WITH DIFFERENTIAL/PLATELET - Abnormal; Notable for the following components:   WBC 14.4 (*)    Neutro Abs 12.8 (*)    Abs Immature Granulocytes 0.08 (*)    All other components within normal limits  PROTIME-INR - Abnormal; Notable for the following components:   Prothrombin Time 16.1 (*)    All other components within normal limits  URINALYSIS, ROUTINE W REFLEX MICROSCOPIC - Abnormal; Notable for the following components:   Color, Urine AMBER (*)    APPearance CLOUDY (*)    Hgb urine dipstick SMALL (*)    Ketones, ur 20 (*)    Protein, ur 30 (*)    Nitrite POSITIVE (*)    Leukocytes,  UA MODERATE (*)    Bacteria, UA MANY (*)    All other components within normal limits  LACTIC ACID, PLASMA - Abnormal; Notable for the following components:   Lactic Acid, Venous 2.1 (*)    All other components within normal limits  I-STAT CG4 LACTIC ACID, ED - Abnormal; Notable for the following components:   Lactic Acid, Venous 2.62 (*)    All other components within normal limits  CULTURE, BLOOD (ROUTINE X 2)  CULTURE, BLOOD (ROUTINE X 2)  LACTIC ACID, PLASMA  INFLUENZA PANEL BY PCR (TYPE A & B)    EKG None  Radiology Dg Chest Port 1 View  Result Date: 03/20/2018 CLINICAL DATA:  Fever and urinary tract infection diagnosed yesterday. EXAM: PORTABLE CHEST 1 VIEW COMPARISON:  PA and lateral chest x-ray of March 13, 2018 FINDINGS: The lungs are borderline hypoinflated. The interstitial markings are coarse though stable allowing for the hypoinflation. There is a stable approximately 4 mm diameter calcified nodule in the right lower lung. The heart and pulmonary vascularity are normal. The mediastinum is normal in width. There is calcification in the wall of the aortic arch. The bony thorax exhibits no acute abnormality. IMPRESSION: Chronic bronchitic changes, stable. No alveolar pneumonia nor CHF. The appearance of the interstitial markings is accentuated by mild hypoinflation. Electronically Signed   By: David  Martinique M.D.   On: 03/20/2018 13:26    Procedures Procedures (including critical care time)  Medications Ordered in ED Medications  metroNIDAZOLE (FLAGYL) IVPB 500 mg (500 mg Intravenous New Bag/Given 03/20/18 1309)  vancomycin (VANCOCIN) IVPB 1000 mg/200 mL premix (has no administration in time range)  acetaminophen (TYLENOL) 650 MG suppository (650 mg  Given 03/20/18 1235)  ceFEPIme (MAXIPIME) 2 g in sodium chloride 0.9 % 100 mL IVPB (2 g Intravenous New Bag/Given 03/20/18 1309)  sodium chloride 0.9 % bolus 1,000 mL (1,000 mLs Intravenous New Bag/Given 03/20/18 1259)     And  sodium chloride 0.9 % bolus 500 mL (500 mLs Intravenous New Bag/Given 03/20/18 1300)     Initial Impression / Assessment and Plan / ED Course  I have reviewed the triage vital signs and the nursing notes.  Pertinent labs & imaging results that were available during my care of the patient were reviewed by me and considered in my medical decision making (see chart for details).     Patient is septic.  Suspect urine as source.  She also has a significant sacral decubiti which could be contributing to her clinical course.  Sepsis protocol initiated.  Blood pressure stabilized.  Pulse has normalized.  Admit to general medicine.   CRITICAL CARE Performed by: Nat Christen Total critical care time: 30 minutes Critical care time was exclusive of separately billable procedures and treating other patients. Critical care was necessary to treat or prevent imminent or life-threatening deterioration. Critical care was time spent personally by me on the following activities: development of treatment plan with patient and/or surrogate as well as nursing, discussions with consultants, evaluation of patient's response to treatment, examination of patient, obtaining history from patient or surrogate, ordering and performing treatments and interventions, ordering and review of laboratory studies, ordering and review of radiographic studies, pulse oximetry and re-evaluation of patient's condition.  Final Clinical Impressions(s) / ED Diagnoses   Final diagnoses:  Sepsis, due to unspecified organism, unspecified whether acute organ dysfunction present [facility])    ED Discharge Orders    None       Nat Christen, MD 03/20/18 1400

## 2018-03-20 NOTE — H&P (Signed)
History and Physical    Catherine [name] PRF:[id number] DOB: 10-21-1927 DOA: 03/20/2018  PCP: Etter Sjogren, DO   Patient coming from: [place] nursing home   Chief Complaint: Altered mental status  HPI: Catherine [name] is a 84 y.o. female with medical history significant of Parkinson's disease with associated dementia, anxiety/depression, recurrent UTI who presents from nursing home with altered mental status and found to have sepsis possibly from a urinary source.  Patient cannot provide any history due to her current medical condition and mental state.  Patient healthcare power of attorney reports that she apparently became progressively more altered yesterday and was taken to [facility] and diagnosed with UTI.  She is apparently not sent back to her nursing home with any antibiotics.  She became progressively more altered and nonresponsive.  She was noted to be febrile and tachycardic.  She was sent to the emergency department in any pain.  She apparently has not been complaining of any cough, dysuria, congestion, rhinorrhea, chest pain.  She currently cannot participate in review of systems were medical condition.  ED Course: In the ER patient was noted to be febrile to 104.7.  She was noted to be tachycardic.  She met sepsis criteria.  Labs are notable for a creatinine of 1.44 with a baseline of around 0.9.  Lactate was mildly elevated at 2.6.  Analysis was positive for nitrites and leukocytes.  White blood cell count was 14.4 with left shift.  Blood cultures were taken.  Influenza panel is pending.  Chest x-ray showed only chronic changes.  CT abdomen pelvis is pending.  Patient was given cefepime, metronidazole, vancomycin.  Review of Systems: Cannot participate due to altered mental status.   Past Medical History:  Diagnosis Date  . Anemia   . Arthritis   . Cancer ([place])    basal cell-forehead  . Decubitus ulcer    sacral  . Dementia ([place])   . Dementia in Parkinson's  disease ([place]) 12/25/2015  . Diabetes mellitus without complication ([place])   . Dyslipidemia   . Hyperlipidemia   . Hypertension   . Migraine without aura, without mention of intractable migraine without mention of status migrainosus   . NSTEMI (non-ST elevated myocardial infarction) ([place])   . Paralysis agitans ([place]) 09/20/2012  . Parkinson's disease ([place])   . Sciatica of left side   . SVT (supraventricular tachycardia) ([place])   . Tremor     Past Surgical History:  Procedure Laterality Date  . APPENDECTOMY    . Arthroscopic surgery     Left knee  . basal cell carcinoma resection     Forehead  . CATARACT EXTRACTION W/PHACO Right 11/19/2013   Procedure: CATARACT EXTRACTION PHACO AND INTRAOCULAR LENS PLACEMENT (IOC);  Surgeon: Tonny Branch, MD;  Location: [facility] ORS;  Service: Ophthalmology;  Laterality: Right;  CDE:  12.95  . CATARACT EXTRACTION W/PHACO Left 12/17/2013   Procedure: CATARACT EXTRACTION PHACO AND INTRAOCULAR LENS PLACEMENT (IOC);  Surgeon: Tonny Branch, MD;  Location: [facility] ORS;  Service: Ophthalmology;  Laterality: Left;  CDE 12.10  . HIP PINNING,CANNULATED Left 05/23/2015   Procedure: INTERNAL FIXATION LEFT HIP;  Surgeon: Carole Civil, MD;  Location: [facility] ORS;  Service: Orthopedics;  Laterality: Left;  . MOUTH SURGERY    . TONSILLECTOMY    . trigger finger surgery     Bilateral thumb     reports that she has never smoked. She has never used smokeless tobacco. She reports that she does not drink  alcohol or use drugs.  Allergies  Allergen Reactions  . Sulfa Antibiotics Rash  . Azilect [Rasagiline] Rash  . Fenofibrate Rash  . Other     baza cream   . Sinemet [Carbidopa-Levodopa] Rash  . Tape Rash and Other (See Comments)    Redness  . Tomato Rash    Pt can eat raw tomatoes.Marland Kitchenate tomato sandwich a few days ago    Family History  Problem Relation Age of Onset  . Rheum arthritis Mother   . Heart attack Father        not certain of COD  . Hypertension Paternal  Grandfather   . Stroke Paternal Grandfather   . Cirrhosis Paternal Grandmother      Prior to Admission medications   Medication Sig Start Date End Date Taking? Authorizing Provider  acetaminophen (TYLENOL) 325 MG tablet Take 650 mg by mouth every 6 (six) hours as needed.   Yes [provider]  amantadine (SYMMETREL) 100 MG capsule Take 100 mg by mouth 2 (two) times daily.   Yes [provider]  carbidopa-levodopa (SINEMET IR) 25-100 MG tablet Take 0.5 tablets by mouth 3 (three) times daily. 09/08/17  Yes Dennie Bible, NP  cetirizine (ZYRTEC) 10 MG tablet Take 10 mg by mouth daily.   Yes [provider]  docusate sodium (COLACE) 100 MG capsule Take 100 mg by mouth 2 (two) times daily.   Yes [provider]  lactose free nutrition (BOOST) LIQD Take 1 Container by mouth 2 (two) times daily between meals.   Yes [provider]  LORazepam (ATIVAN) 0.5 MG tablet 1 tablet at night, take 1 tab in the morning if needed 03/17/18  Yes Kathrynn Ducking, MD  mirtazapine (REMERON) 7.5 MG tablet Take 7.5 mg by mouth at bedtime.   Yes [provider]  Multiple Vitamin (MULTIVITAMIN) capsule Take 1 capsule by mouth daily.   Yes [provider]  ondansetron (ZOFRAN) 4 MG tablet Take 4 mg by mouth every 6 (six) hours as needed for nausea or vomiting.    Yes [provider]  polyethylene glycol (MIRALAX / GLYCOLAX) packet Take 17 g by mouth daily.    Yes [provider]  QUEtiapine (SEROQUEL) 50 MG tablet Take 50 mg by mouth See admin instructions. 25 mg in the morning, 75 mg at night 07/01/16  Yes [provider]  senna (SENOKOT) 8.6 MG TABS tablet Take 1 tablet by mouth at bedtime.   Yes [provider]  Sodium Phosphates (ENEMA DISPOSABLE RE) Place 1 application rectally 2 (two) times daily as needed. As needed for constipation twice weekly   Yes [provider]  traZODone (DESYREL) 50 MG tablet  Take 25 mg by mouth at bedtime.    Yes [provider]    Physical Exam: Vitals:   03/20/18 1228 03/20/18 1230 03/20/18 1234 03/20/18 1300  BP: (!) 155/76 135/73  131/65  Pulse: (!) 148 (!) 143  (!) 101  Resp: (!) 32 (!) 32  (!) 27  Temp:   (!) 104.7 F (40.4 C)   TempSrc:   Rectal   SpO2: 94% 94%  92%  Weight:      Height:   _0  (1.549 m)     Constitutional: Minimally responsive Vitals:   03/20/18 1228 03/20/18 1230 03/20/18 1234 03/20/18 1300  BP: (!) 155/76 135/73  131/65  Pulse: (!) 148 (!) 143  (!) 101  Resp: (!) 32 (!) 32  (!) 27  Temp:   Marland Kitchen)  104.7 F (40.4 C)   TempSrc:   Rectal   SpO2: 94% 94%  92%  Weight:      Height:   _0  (1.549 m)    Eyes: PERRL, eyes closed, does not follow commands ENMT: Dry mucous membranes, edentulous Neck: normal, supple Respiratory: No increased work of breathing, anterior lung sounds clear, no wheezes, rhonchi, rales Cardiovascular: Tachycardic, regular rhythm, 2 out of 6 systolic murmur.  Abdomen: no tenderness, no masses palpated. No hepatosplenomegaly. Bowel sounds positive.  Musculoskeletal: No lower extremity edema.  Skin: no rashes over visible skin Neurologic: Unable to participate neurological exam, not alert or oriented, moves extremities to painful stimuli Psychiatric: Unable to assess due to medical condition    Labs on Admission: I have personally reviewed following labs and imaging studies  CBC: Recent Labs  Lab 03/13/18 1831 03/20/18 1234  WBC 7.7 14.4*  NEUTROABS  --  12.8*  HGB 13.3 14.7  HCT 42.0 43.1  MCV 100.5* 96.2  PLT 145* 299   Basic Metabolic Panel: Recent Labs  Lab 03/13/18 1831 03/20/18 1234  NA 144 144  K 4.1 3.8  CL 109 109  CO2 26 21*  GLUCOSE 121* 243*  BUN 17 26*  CREATININE 0.91 1.44*  CALCIUM 9.4 9.2   GFR: Estimated Creatinine Clearance: 20.4 mL/min (A) (by C-G formula based on SCr of 1.44 mg/dL (H)). Liver Function Tests: Recent Labs  Lab 03/13/18 1831  03/20/18 1234  AST 17 23  ALT 7 19  ALKPHOS 67 74  BILITOT 1.1 1.2  PROT 7.4 6.9  ALBUMIN 4.4 3.7   No results for input(s): LIPASE, AMYLASE in the last 168 hours. No results for input(s): AMMONIA in the last 168 hours. Coagulation Profile: Recent Labs  Lab 03/20/18 1234  INR 1.30   Cardiac Enzymes: Recent Labs  Lab 03/13/18 1828  TROPONINI <0.03   BNP (last 3 results) No results for input(s): PROBNP in the last 8760 hours. HbA1C: No results for input(s): HGBA1C in the last 72 hours. CBG: Recent Labs  Lab 03/13/18 1740  GLUCAP 135*   Lipid Profile: No results for input(s): CHOL, HDL, LDLCALC, TRIG, CHOLHDL, LDLDIRECT in the last 72 hours. Thyroid Function Tests: No results for input(s): TSH, T4TOTAL, FREET4, T3FREE, THYROIDAB in the last 72 hours. Anemia Panel: No results for input(s): VITAMINB12, FOLATE, FERRITIN, TIBC, IRON, RETICCTPCT in the last 72 hours. Urine analysis:    Component Value Date/Time   COLORURINE AMBER (A) 03/20/2018 1229   APPEARANCEUR CLOUDY (A) 03/20/2018 1229   LABSPEC 1.019 03/20/2018 1229   PHURINE 5.0 03/20/2018 1229   GLUCOSEU NEGATIVE 03/20/2018 1229   HGBUR SMALL (A) 03/20/2018 1229   BILIRUBINUR NEGATIVE 03/20/2018 1229   KETONESUR 20 (A) 03/20/2018 1229   PROTEINUR 30 (A) 03/20/2018 1229   UROBILINOGEN 0.2 12/21/2014 2315   NITRITE POSITIVE (A) 03/20/2018 1229   LEUKOCYTESUR MODERATE (A) 03/20/2018 1229    Radiological Exams on Admission: Dg Chest Port 1 View  Result Date: 03/20/2018 CLINICAL DATA:  Fever and urinary tract infection diagnosed yesterday. EXAM: PORTABLE CHEST 1 VIEW COMPARISON:  PA and lateral chest x-ray of March 13, 2018 FINDINGS: The lungs are borderline hypoinflated. The interstitial markings are coarse though stable allowing for the hypoinflation. There is a stable approximately 4 mm diameter calcified nodule in the right lower lung. The heart and pulmonary vascularity are normal. The mediastinum is  normal in width. There is calcification in the wall of the aortic arch. The bony thorax exhibits no  acute abnormality. IMPRESSION: Chronic bronchitic changes, stable. No alveolar pneumonia nor CHF. The appearance of the interstitial markings is accentuated by mild hypoinflation. Electronically Signed   By: David  Martinique M.D.   On: 03/20/2018 13:26    EKG: Independently reviewed.  Sinus tachycardia, no acute ST segment changes, poor R wave progression,  Assessment/Plan Principal Problem:   Sepsis ([place]) Active Problems:   Type 2 diabetes mellitus (HCC)   PAT (paroxysmal atrial tachycardia) (HCC)   Parkinson's disease (HCC)   Dementia in Parkinson's disease (HCC)   Altered mental status   UTI (urinary tract infection)   Generalized weakness   Hypothyroidism   #) Sepsis from urinary source: Currently the only source of possible sepsis is her urine.  He unfortunately has no localizing signs or symptoms on exam.  Have low suspicion for primary CNS lesion.  Of note the other sources of her significant fever could be withdrawal of her parkinsonian medications however this is felt to be less likely as she is from nursing home did be presumably diligent about giving her them. -Continue IV cefepime, metronidazole, vancomycin started 03/20/2017 - Follow-up blood cultures ordered 03/20/2018 -Follow-up CT abdomen pelvis without contrast - Follow-up urine culture ordered 03/20/2018  #) Altered mental status: Per discussion with the patient's significant other the patient can normally converse coherently though she is bedbound/wheelchair bound.  This is likely secondary to sepsis. -Hold sedating medications  #) AKI: Likely secondary to sepsis. -Gentle IV fluids - Avoid nephrotoxins  #) History of hyperthyroidism: This is on her problem list but she is not on any medication for it. -We will check TSH  #) Parkinson's disease, gated by dementia: Suspect patient has dementia with Lewy bodies. -  Continue amantadine 100 mg twice daily -Continue carbidopa-levodopa half tablet 3 times daily  #) Pain/psych: - Hold lorazepam 0.5 mg twice daily as needed -Hold trazodone 25 mg nightly - Hold quetiapine 25 mg every morning and 75 mg nightly -Hold mirtazapine 7.5 mg nightly    Fluids: Gentle IV fluids Electro lites: Monitor and supplement Nutrition: N.p.o., speech line which pathology consult  Prophylaxis: Subcu heparin  Disposition: Ending resolution of sepsis and altered mental status  DO NOT RESUSCITATE    Cristy Folks MD [facility]  If 7PM-7AM, please contact night-coverage [email or web address] Password [facility]  03/20/2018, 2:27 PM

## 2018-03-20 NOTE — ED Notes (Signed)
DR Lacinda Axon notified of Suspected sepsis

## 2018-03-20 NOTE — Progress Notes (Signed)
Patient arrived from ED with nursing staff. CHG bath given, pur wick placed and MRSA Lab work obtained. Vital signs stable but patient remains only responsive to pain. Moaning slightly at times but unclear on pain level or patient needs. Repositioned patient and moaning stopped. Will continue to monitor for changes and orders.

## 2018-03-20 NOTE — ED Notes (Signed)
CRITICAL RESULT  03/20/2018 1258  Lactic  Result: 2.62  MD Notified: Lacinda Axon

## 2018-03-20 NOTE — ED Notes (Signed)
CRITICAL VALUE ALERT  Critical Value:  Lactic Acid 2.1  Date & Time Notied:  03/20/18 1332  Provider Notified: Dr. Lacinda Axon  Orders Received/Actions taken: EDP notified, no further orders given

## 2018-03-21 ENCOUNTER — Inpatient Hospital Stay (HOSPITAL_COMMUNITY): Payer: Medicare Other

## 2018-03-21 DIAGNOSIS — Z7189 Other specified counseling: Secondary | ICD-10-CM

## 2018-03-21 DIAGNOSIS — N3000 Acute cystitis without hematuria: Secondary | ICD-10-CM

## 2018-03-21 DIAGNOSIS — R4182 Altered mental status, unspecified: Secondary | ICD-10-CM

## 2018-03-21 DIAGNOSIS — Z515 Encounter for palliative care: Secondary | ICD-10-CM

## 2018-03-21 LAB — BASIC METABOLIC PANEL
BUN: 27 mg/dL — ABNORMAL HIGH (ref 8–23)
CO2: 23 mmol/L (ref 22–32)
Calcium: 8.1 mg/dL — ABNORMAL LOW (ref 8.9–10.3)
Chloride: 117 mmol/L — ABNORMAL HIGH (ref 98–111)
Creatinine, Ser: 0.97 mg/dL (ref 0.44–1.00)
GFR calc Af Amer: 59 mL/min — ABNORMAL LOW (ref >60)
GFR calc non Af Amer: 51 mL/min — ABNORMAL LOW (ref >60)
Sodium: 146 mmol/L — ABNORMAL HIGH (ref 135–145)

## 2018-03-21 LAB — CBC
HCT: 37 % (ref 36.0–46.0)
Hemoglobin: 12.1 g/dL (ref 12.0–15.0)
MCH: 32 pg (ref 26.0–34.0)
MCHC: 32.7 g/dL (ref 30.0–36.0)
MCV: 97.9 fL (ref 80.0–100.0)
Platelets: 144 10*3/uL — ABNORMAL LOW (ref 150–400)
RBC: 3.78 MIL/uL — ABNORMAL LOW (ref 3.87–5.11)
RDW: 13.2 % (ref 11.5–15.5)
WBC: 11.8 10*3/uL — ABNORMAL HIGH (ref 4.0–10.5)
nRBC: 0 % (ref 0.0–0.2)

## 2018-03-21 LAB — BASIC METABOLIC PANEL WITH GFR
Anion gap: 6 (ref 5–15)
Glucose, Bld: 143 mg/dL — ABNORMAL HIGH (ref 70–99)
Potassium: 3.6 mmol/L (ref 3.5–5.1)

## 2018-03-21 LAB — PROCALCITONIN: Procalcitonin: 3.09 ng/mL

## 2018-03-21 MED ORDER — IOPAMIDOL (ISOVUE-300) INJECTION 61%
100.0000 mL | Freq: Once | INTRAVENOUS | Status: AC | PRN
  Administered 2018-03-21: 100 mL via INTRAVENOUS

## 2018-03-21 MED ORDER — LACTATED RINGERS IV SOLN
INTRAVENOUS | Status: DC
  Administered 2018-03-21 – 2018-03-23 (×4): via INTRAVENOUS

## 2018-03-21 MED ORDER — FLEET ENEMA 7-19 GM/118ML RE ENEM
1.0000 | ENEMA | Freq: Once | RECTAL | Status: AC
  Administered 2018-03-21: 1 via RECTAL

## 2018-03-21 NOTE — H&P (Addendum)
Received CSW consult for pt admitted from [place]. Pt discussed with MD in [place] this AM. Palliative Care consult is ordered. Will await assessment from Palliative Care and follow up as needed for CSW concerns.

## 2018-03-21 NOTE — Care Management (Signed)
CM consulted for LTC. Patient resides at [facility]. CSW consulted and aware. CM will clear consult.

## 2018-03-21 NOTE — Progress Notes (Addendum)
PROGRESS NOTE    Catherine [name]  JOA:[id number] DOB: 1927-09-15 DOA: 03/20/2018 PCP: Etter Sjogren, DO   Brief Narrative:   Catherine [name] is a 84 y.o. female with medical history significant of Parkinson's disease with associated dementia, anxiety/depression, recurrent UTI who presents from nursing home with altered mental status and found to have sepsis possibly from a urinary source.  Patient cannot provide any history due to her current medical condition and mental state.  She was admitted with sepsis secondary to UTI, but urine cultures did not appear to have been collected.  She was started empirically on cefepime, metronidazole, and vancomycin.  She continues to remain quite somnolent and sedating agents have been withheld.  She was also noted to have AKI which is improving.  She is also noted to have sacral skin breakdown which has been assessed by wound care nurse.  Assessment & Plan:   Principal Problem:   Sepsis ([place]) Active Problems:   Type 2 diabetes mellitus (HCC)   PAT (paroxysmal atrial tachycardia) (HCC)   Parkinson's disease (HCC)   Dementia in Parkinson's disease ([place])   Altered mental status   UTI (urinary tract infection)   Generalized weakness   Hypothyroidism   Sepsis due to urinary tract infection ([place])   Sepsis secondary to UTI-improving: Currently the only source of possible sepsis is her urine.  He unfortunately has no localizing signs or symptoms on exam.  Have low suspicion for primary CNS lesion.  Of note the other sources of her significant fever could be withdrawal of her parkinsonian medications however this is felt to be less likely as she is from nursing home did be presumably diligent about giving her them. -Continue IV cefepime, metronidazole started 03/20/2017 -Discontinue vancomycin since MRSA swab negative. - Follow-up blood cultures ordered 03/20/2018 -Follow-up CT abdomen pelvis without contrast demonstrates what appears to be a a  collection of fluid possibly in the uterus after discussion with IR.  No need for drainage immediately noted, nor with a consider a procedure such as this for what is seen on imaging.  Additionally, spoke with Dr. Constance [place] of general surgery who does not recommend any surgical intervention.  We will plan to repeat CT imaging with contrast as kidney function is improved after enema has been given.  Please follow-up repeat imaging.  May need pelvic exam based on findings.  Acute metabolic encephalopathy secondary to above: Per discussion with the patient's significant other the patient can normally converse coherently though she is bedbound/wheelchair bound.  This is likely secondary to sepsis. -Hold sedating medications and continue to monitor with neurochecks -Consult to palliative care for goals of care discussion  AKI-improving; non-oliguric: Likely secondary to sepsis. -Gentle IV fluids, now with LR decrease rate to 75cc/hr - Avoid nephrotoxins  History of hyperthyroidism: This is on her problem list but she is not on any medication for it. -TSH WNL  Parkinson's disease, gated by dementia: Suspect patient has dementia with Lewy bodies. - Hold amantadine 100 mg twice daily -Hold carbidopa-levodopa half tablet 3 times daily  Pain/psych: - Hold lorazepam 0.5 mg twice daily as needed -Hold trazodone 25 mg nightly - Hold quetiapine 25 mg every morning and 75 mg nightly -Hold mirtazapine 7.5 mg nightly    DVT prophylaxis: Heparin changed to SCDs due to thrombocytopenia Code Status: DNR Family Communication: Caretaker at bedside Disposition Plan: Continue monitoring while on cefepime and Flagyl with vancomycin discontinued.  Initiate oral intake once more awake and alert.  Palliative care  consulted.   Consultants:   Palliative care  Procedures:   None  Antimicrobials:   Vancomycin, cefepime, and Flagyl 11/11  Vancomycin discontinued 11/12   Subjective: Patient seen and  evaluated today and continues to remain quite somnolent and unresponsive.  Caretaker at bedside who states that patient is usually much more awake and alert.  No acute events noted overnight.  Objective: Vitals:   03/21/18 0600 03/21/18 0700 03/21/18 0734 03/21/18 0937  BP: (!) 143/73 (!) 147/78  (!) 142/82  Pulse: 70 76  (!) 52  Resp: 19 (!) 25    Temp:   97.7 F (36.5 C)   TempSrc:   Axillary   SpO2: 98% 98%    Weight:      Height:        Intake/Output Summary (Last 24 hours) at 03/21/2018 1149 Last data filed at 03/21/2018 0800 Gross per 24 hour  Intake 3229.26 ml  Output 500 ml  Net 2729.26 ml   Filed Weights   03/20/18 1226 03/20/18 1800 03/21/18 0500  Weight: 49.9 kg 49.5 kg 45.9 kg    Examination:  General exam: Appears calm and comfortable, unresponsive Respiratory system: Clear to auscultation. Respiratory effort normal. Cardiovascular system: S1 & S2 heard, RRR. No JVD, murmurs, rubs, gallops or clicks. No pedal edema. Gastrointestinal system: Abdomen is nondistended, soft and nontender. No organomegaly or masses felt. Normal bowel sounds heard. Central nervous system: Cannot be assessed Extremities: No edema noted bilaterally Skin: No rashes, lesions or ulcers Psychiatry: Cannot be assessed    Data Reviewed: I have personally reviewed following labs and imaging studies  CBC: Recent Labs  Lab 03/20/18 1234 03/21/18 0337  WBC 14.4* 11.8*  NEUTROABS 12.8*  --   HGB 14.7 12.1  HCT 43.1 37.0  MCV 96.2 97.9  PLT 217 400*   Basic Metabolic Panel: Recent Labs  Lab 03/20/18 1234 03/21/18 0337  NA 144 146*  K 3.8 3.6  CL 109 117*  CO2 21* 23  GLUCOSE 243* 143*  BUN 26* 27*  CREATININE 1.44* 0.97  CALCIUM 9.2 8.1*   GFR: Estimated Creatinine Clearance: 29 mL/min (by C-G formula based on SCr of 0.97 mg/dL). Liver Function Tests: Recent Labs  Lab 03/20/18 1234  AST 23  ALT 19  ALKPHOS 74  BILITOT 1.2  PROT 6.9  ALBUMIN 3.7   No results  for input(s): LIPASE, AMYLASE in the last 168 hours. No results for input(s): AMMONIA in the last 168 hours. Coagulation Profile: Recent Labs  Lab 03/20/18 1234  INR 1.30   Cardiac Enzymes: No results for input(s): CKTOTAL, CKMB, CKMBINDEX, TROPONINI in the last 168 hours. BNP (last 3 results) No results for input(s): PROBNP in the last 8760 hours. HbA1C: No results for input(s): HGBA1C in the last 72 hours. CBG: No results for input(s): GLUCAP in the last 168 hours. Lipid Profile: No results for input(s): CHOL, HDL, LDLCALC, TRIG, CHOLHDL, LDLDIRECT in the last 72 hours. Thyroid Function Tests: Recent Labs    03/20/18 1234  TSH 0.981   Anemia Panel: No results for input(s): VITAMINB12, FOLATE, FERRITIN, TIBC, IRON, RETICCTPCT in the last 72 hours. Sepsis Labs: Recent Labs  Lab 03/20/18 1234 03/20/18 1255 03/20/18 1431 03/21/18 0337  PROCALCITON  --   --   --  3.09  LATICACIDVEN 2.1* 2.62* 1.2  --     Recent Results (from the past 240 hour(s))  Culture, blood (Routine x 2)     Status: None (Preliminary result)   Collection Time: 03/20/18  12:35 PM  Result Value Ref Range Status   Specimen Description BLOOD RIGHT FOREARM  Final   Special Requests   Final    BOTTLES DRAWN AEROBIC AND ANAEROBIC Blood Culture adequate volume   Culture   Final    NO GROWTH < 24 HOURS Performed at [facility], [address] [place], [place] [address]    Report Status PENDING  Incomplete  Culture, blood (Routine x 2)     Status: None (Preliminary result)   Collection Time: 03/20/18  1:09 PM  Result Value Ref Range Status   Specimen Description BLOOD RIGHT HAND  Final   Special Requests   Final    BOTTLES DRAWN AEROBIC AND ANAEROBIC Blood Culture adequate volume   Culture   Final    NO GROWTH < 24 HOURS Performed at [facility], [address], [place], [place] [address]    Report Status PENDING  Incomplete  MRSA PCR Screening     Status: None   Collection Time: 03/20/18  4:36 PM   Result Value Ref Range Status   MRSA by PCR NEGATIVE NEGATIVE Final    Comment:        The GeneXpert MRSA Assay (FDA approved for NASAL specimens only), is one component of a comprehensive MRSA colonization surveillance program. It is not intended to diagnose MRSA infection nor to guide or monitor treatment for MRSA infections. Performed at [facility], [address] [place], [place] [address]          Radiology Studies: Ct Abdomen Pelvis Wo Contrast  Result Date: 03/20/2018 CLINICAL DATA:  84 year old female with fever and UTI. Prior appendectomy. Initial encounter. EXAM: CT ABDOMEN AND PELVIS WITHOUT CONTRAST TECHNIQUE: Multidetector CT imaging of the abdomen and pelvis was performed following the standard protocol without IV contrast. COMPARISON:  No comparison CT. FINDINGS: Lower chest: Small granulomas lung bases/right middle lobe. Scarring/subsegmental atelectasis. Heart size within normal limits. Coronary artery calcification. Calcified aortic valve. Dense breast parenchyma. Hepatobiliary: Taking into account limitation by non contrast imaging, no worrisome hepatic lesion. Evaluation of gallbladder limited by streak artifact from patient's arm. No calcified gallstones noted. Pancreas: Taking into account limitation by non contrast imaging, no worrisome pancreatic lesion. Spleen: Taking into account limitation by non contrast imaging, no splenic lesion or enlargement. Adrenals/Urinary Tract: No obstructing stone or hydronephrosis. Calcifications left kidney may represent nonobstructing renal calculi and/or vascular calcifications. Taking into account limitation by non contrast imaging, no worrisome renal lesion. Mild hyperplasia adrenal glands bilaterally. No primary urinary bladder abnormality noted. Stomach/Bowel: Prominent stool rectosigmoid region patient at risk for stercoral colitis. Anterior to the stool filled prominent size rectosigmoid region is a gas and fluid collection  which is impressing upon the posterior aspect of the bladder. Inferior extent at the expected level of the vagina and therefore this may be gas and fluid within the vagina. Direct visualization may prove helpful. Abscess felt to be a secondary less likely consideration. Vascular/Lymphatic: Atherosclerotic changes aorta with slight ectasia without aneurysm. Scattered normal size lymph nodes. Reproductive: As above.  No worrisome adnexal mass. Other: Mild haziness sacral region may represent sacral decubitus. Musculoskeletal: Degenerative changes L5-S1. IMPRESSION: 1. Anterior to the stool filled prominent size rectosigmoid region is a gas and fluid collection which is impressing upon the posterior aspect of the bladder. Inferior extent at the expected level of the vagina and therefore this may be gas and fluid within the vagina. Direct visualization may prove helpful. Abscess felt to be a secondary less likely consideration (  this possibility can be re-evaluated on follow-up imaging if vaginal exam is not possible). 2. Mild sacral decubitus. 3. No obstructing stone or hydronephrosis. Calcifications left kidney may represent nonobstructing renal calculi or vascular calcifications. These results were called by telephone at the time of interpretation on 03/20/2018 at 3:55 pm to Dr. Nat Christen , who verbally acknowledged these results. Electronically Signed   By: Genia Del M.D.   On: 03/20/2018 15:56   Dg Chest Port 1 View  Result Date: 03/20/2018 CLINICAL DATA:  Fever and urinary tract infection diagnosed yesterday. EXAM: PORTABLE CHEST 1 VIEW COMPARISON:  PA and lateral chest x-ray of March 13, 2018 FINDINGS: The lungs are borderline hypoinflated. The interstitial markings are coarse though stable allowing for the hypoinflation. There is a stable approximately 4 mm diameter calcified nodule in the right lower lung. The heart and pulmonary vascularity are normal. The mediastinum is normal in width. There is  calcification in the wall of the aortic arch. The bony thorax exhibits no acute abnormality. IMPRESSION: Chronic bronchitic changes, stable. No alveolar pneumonia nor CHF. The appearance of the interstitial markings is accentuated by mild hypoinflation. Electronically Signed   By: David  Martinique M.D.   On: 03/20/2018 13:26        Scheduled Meds: . amantadine  100 mg Oral BID  . carbidopa-levodopa  0.5 tablet Oral TID  . docusate sodium  100 mg Oral BID  . Influenza vac split quadrivalent PF  0.5 mL Intramuscular Tomorrow-1000  . loratadine  10 mg Oral Daily  . polyethylene glycol  17 g Oral Daily  . senna  1 tablet Oral QHS   Continuous Infusions: . ceFEPime (MAXIPIME) IV    . lactated ringers 75 mL/hr at 03/21/18 0818  . metronidazole 500 mg (03/21/18 0526)     LOS: 1 day    Time spent: 30 minutes    Pratik Darleen Crocker, DO [facility] Pager [phone]  If 7PM-7AM, please contact night-coverage [email or web address] Password [facility] 03/21/2018, 11:49 AM

## 2018-03-21 NOTE — Evaluation (Signed)
Clinical/Bedside Swallow Evaluation Patient Details  Name: Catherine [name] MRN: [id number] Date of Birth: 07-Apr-1928  Today's Date: 03/21/2018 Time: SLP Start Time (ACUTE ONLY): 1200 SLP Stop Time (ACUTE ONLY): 1225 SLP Time Calculation (min) (ACUTE ONLY): 25 min  Past Medical History:  Past Medical History:  Diagnosis Date  . Anemia   . Arthritis   . Cancer ([place])    basal cell-forehead  . Decubitus ulcer    sacral  . Dementia ([place])   . Dementia in Parkinson's disease ([place]) 12/25/2015  . Diabetes mellitus without complication ([place])   . Dyslipidemia   . Hyperlipidemia   . Hypertension   . Migraine without aura, without mention of intractable migraine without mention of status migrainosus   . NSTEMI (non-ST elevated myocardial infarction) ([place])   . Paralysis agitans ([place]) 09/20/2012  . Parkinson's disease ([place])   . Sciatica of left side   . SVT (supraventricular tachycardia) ([place])   . Tremor    Past Surgical History:  Past Surgical History:  Procedure Laterality Date  . APPENDECTOMY    . Arthroscopic surgery     Left knee  . basal cell carcinoma resection     Forehead  . CATARACT EXTRACTION W/PHACO Right 11/19/2013   Procedure: CATARACT EXTRACTION PHACO AND INTRAOCULAR LENS PLACEMENT (IOC);  Surgeon: Tonny Branch, MD;  Location: [facility];  Service: Ophthalmology;  Laterality: Right;  CDE:  12.95  . CATARACT EXTRACTION W/PHACO Left 12/17/2013   Procedure: CATARACT EXTRACTION PHACO AND INTRAOCULAR LENS PLACEMENT (IOC);  Surgeon: Tonny Branch, MD;  Location: [facility];  Service: Ophthalmology;  Laterality: Left;  CDE 12.10  . HIP PINNING,CANNULATED Left 05/23/2015   Procedure: INTERNAL FIXATION LEFT HIP;  Surgeon: Carole Civil, MD;  Location: [facility];  Service: Orthopedics;  Laterality: Left;  . MOUTH SURGERY    . TONSILLECTOMY    . trigger finger surgery     Bilateral thumb   HPI:  Catherine [name] is a 84 y.o. female with medical history significant of Parkinson's disease with  associated dementia, anxiety/depression, recurrent UTI who presents from nursing home with altered mental status and found to have sepsis possibly from a urinary source.  Patient cannot provide any history due to her current medical condition and mental state.  Patient healthcare power of attorney reports that she apparently became progressively more altered yesterday and was taken to [facility] and diagnosed with UTI.  She is apparently not sent back to her nursing home with any antibiotics.  She became progressively more altered and nonresponsive.  She was noted to be febrile and tachycardic.  She was sent to the emergency department in any pain.  She apparently has not been complaining of any cough, dysuria, congestion, rhinorrhea, chest pain.  She currently cannot participate in review of systems were medical condition. In the ER patient was noted to be febrile to 104.7.  She was noted to be tachycardic.  She met sepsis criteria.  Labs are notable for a creatinine of 1.44 with a baseline of around 0.9.  Lactate was mildly elevated at 2.6.  Analysis was positive for nitrites and leukocytes.  White blood cell count was 14.4 with left shift.  Blood cultures were taken.  Influenza panel is pending.  Chest x-ray showed only chronic changes.  CT abdomen pelvis is pending.  Patient was given cefepime, metronidazole, vancomycin. BSE requested. Caregiver at bedside reports that Pt is typically alert and talkative and consumes a regular diet with thin liquids at ALF.  Assessment / Plan / Recommendation Clinical Impression  Pt currently inappropriate for po including medications due to lethargy. Unfortunately, Pt is not able to receive her Parkinson's medications which may further negatively impact ability to swallow. Recommend NPO including medications (meds via alternative means as possible) with strict oral care protocol with suction. Pt's dentures were removed as they were floating in the back of her mouth  upon SLP arrival. SLP will follow. Above to RN and caregiver at bedside. SLP Visit Diagnosis: Dysphagia, unspecified (R13.10)    Aspiration Risk  Risk for inadequate nutrition/hydration;Severe aspiration risk    Diet Recommendation NPO   Medication Administration: Via alternative means    Other  Recommendations Oral Care Recommendations: Oral care QID;Staff/trained caregiver to provide oral care   Follow up Recommendations 24 hour supervision/assistance      Frequency and Duration min 2x/week  1 week       Prognosis Prognosis for Safe Diet Advancement: Guarded Barriers to Reach Goals: (lethargy and inability to get PD medications)      Swallow Study   General Date of Onset: 03/20/18 HPI: Catherine [name] is a 84 y.o. female with medical history significant of Parkinson's disease with associated dementia, anxiety/depression, recurrent UTI who presents from nursing home with altered mental status and found to have sepsis possibly from a urinary source.  Patient cannot provide any history due to her current medical condition and mental state.  Patient healthcare power of attorney reports that she apparently became progressively more altered yesterday and was taken to [facility] ER and diagnosed with UTI.  She is apparently not sent back to her nursing home with any antibiotics.  She became progressively more altered and nonresponsive.  She was noted to be febrile and tachycardic.  She was sent to the emergency department in any pain.  She apparently has not been complaining of any cough, dysuria, congestion, rhinorrhea, chest pain.  She currently cannot participate in review of systems were medical condition. In the ER patient was noted to be febrile to 104.7.  She was noted to be tachycardic.  She met sepsis criteria.  Labs are notable for a creatinine of 1.44 with a baseline of around 0.9.  Lactate was mildly elevated at 2.6.  Analysis was positive for nitrites and leukocytes.  White  blood cell count was 14.4 with left shift.  Blood cultures were taken.  Influenza panel is pending.  Chest x-ray showed only chronic changes.  CT abdomen pelvis is pending.  Patient was given cefepime, metronidazole, vancomycin. BSE requested. Caregiver at bedside reports that Pt is typically alert and talkative and consumes a regular diet with thin liquids at ALF. Type of Study: Bedside Swallow Evaluation Previous Swallow Assessment: None on record Diet Prior to this Study: NPO Temperature Spikes Noted: No Respiratory Status: Room air History of Recent Intubation: No Behavior/Cognition: Lethargic/Drowsy Oral Cavity Assessment: Excessive secretions Oral Care Completed by SLP: Yes Oral Cavity - Dentition: Dentures, top(dentures were loose in mouth so removed) Vision: Impaired for self-feeding Self-Feeding Abilities: Other (Comment)(oral care and ice chip only) Patient Positioning: Upright in bed Baseline Vocal Quality: Not observed Volitional Cough: Cognitively unable to elicit Volitional Swallow: (observed one attempt to swallow)    Oral/Motor/Sensory Function Overall Oral Motor/Sensory Function: (difficult to assess)   Ice Chips Ice chips: Impaired Presentation: (presented to lips) Oral Phase Impairments: Reduced labial seal;Reduced lingual movement/coordination;Poor awareness of bolus Oral Phase Functional Implications: Left anterior spillage Pharyngeal Phase Impairments: Suspected delayed Swallow;Decreased hyoid-laryngeal movement  Thin Liquid Thin Liquid: Not tested    Nectar Thick Nectar Thick Liquid: Not tested   Honey Thick Honey Thick Liquid: Not tested   Puree Puree: Not tested   Solid     Solid: Not tested     Thank you,  Genene Churn, [place]  PORTER,DABNEY 03/21/2018,12:34 PM

## 2018-03-21 NOTE — Consult Note (Signed)
Consultation Note Date: 03/21/2018   Patient Name: Catherine [name]  DOB: 1927-11-07  MRN: [id number]  Age / Sex: 84 y.o., female  PCP: Etter Sjogren, DO Referring Physician: Rodena Goldmann, DO  Reason for Consultation: Establishing goals of care  HPI/Patient Profile: 84 y.o. female  with past medical history of Parkinson's, dementia, DM, HTN, HLD, h/o NSTEMI, h/o SVT anxiety/depression, recurrent UTI admitted on 03/20/2018 from [place] with UTI sepsis with AKI and continued to be largely unresponsive. CT abd/pelvis demonstrates fluid collection in uterus with recommendations for no immediate interventions from IR or general surgery with plans for repeat imaging with contrast after enema and as renal function allows for contrast. She is also unable to take her Parkinson's medications given her current unresponsive state and inability to take po.   Clinical Assessment and Goals of Care: I met today at Catherine [name]'s bedside. Her caregiver, Catherine [name], is at bedside and has been working with Catherine [name] for the past ~1 year. Catherine [name] shares that Catherine [name] is typically very social and talkative with some confusion that comes and goes and is normally "much more lively." Reports she cannot really ambulate but can stand and take a few steps with assist but mainly wheelchair bound. She denies any issues with swallowing and appetite if fairly good although she has been losing a lot of weight. Catherine [name] estimates ~15 lb weight loss over past few months. She also says that Catherine [name] has control of bowel and bladder and without incontinence prior to admission. Reports increased confusion with past UTIs but not lethargy such as this.     I called and spoke with Mr. Catherine [name] her POA (along with his wife as well). He shares that he was with her Sunday when she yelled out with pain in her chest. He says she was sent to [facility] but  was sent back to [facility] from the ED. He says she was more sleepy than usual on Sunday (was normal last Friday) and just worsened until she was brought to [facility] on yesterday. They echo Catherine [name] in that this is far from Ms. Uselman's baseline. They are hopeful that she will improve with antibiotics but also know that she cannot take her Parkinson's medications while she is so lethargic. He also confirms DNR and when asked he does not think that Ms. Schumm would want a feeding tube. I did share that the medical team is following up on a fluid collection around her uterus and we will share when we have any updates or new information about this.   Will monitor over next 24 hours and will touch base with Mr. Catherine [name] again. Mr. Catherine [name] will be in [place] with his wife tomorrow and will not be to the hospital until late afternoon if he is able. He will be available by phone. I will continue to follow and discuss with Mr. Catherine [name] GOC.   Primary Decision [place]    SUMMARY OF RECOMMENDATIONS   - DNR confirmed - Unlikely to desire  feeding tube (would need to be confirmed if it comes to this) - HCPOA hopeful for improvement but knows she is very ill  Code Status/Advance Care Planning:  DNR   Symptom Management:   AMS: Medical team treating UTI and following for any other source of infection or cause for AMS. Avoid sedating medications. Delirium precautions.   Palliative Prophylaxis:   Aspiration, Bowel Regimen, Delirium Protocol, Oral Care, Palliative Wound Care and Turn Reposition  Additional Recommendations (Limitations, Scope, Preferences):  Treat the treatable  Psycho-social/Spiritual:   Desire for further Chaplaincy support:no  Additional Recommendations: Caregiving  Support/Resources  Prognosis:   To be determined.   Discharge Planning: To Be Determined      Primary Diagnoses: Present on Admission: . UTI (urinary tract infection) . PAT (paroxysmal atrial  tachycardia) ([place]) . Parkinson's disease ([place]) . Hypothyroidism . Dementia in Parkinson's disease ([place]) . Altered mental status . Sepsis due to urinary tract infection ([place])   I have reviewed the medical record, interviewed the patient and family, and examined the patient. The following aspects are pertinent.  Past Medical History:  Diagnosis Date  . Anemia   . Arthritis   . Cancer ([place])    basal cell-forehead  . Decubitus ulcer    sacral  . Dementia ([place])   . Dementia in Parkinson's disease ([place]) 12/25/2015  . Diabetes mellitus without complication ([place])   . Dyslipidemia   . Hyperlipidemia   . Hypertension   . Migraine without aura, without mention of intractable migraine without mention of status migrainosus   . NSTEMI (non-ST elevated myocardial infarction) ([place])   . Paralysis agitans ([place]) 09/20/2012  . Parkinson's disease ([place])   . Sciatica of left side   . SVT (supraventricular tachycardia) ([place])   . Tremor    Social History   Socioeconomic History  . Marital status: Widowed    Spouse name: Not on file  . Number of children: 0  . Years of education: Not on file  . Highest education level: Not on file  Occupational History  . Occupation: Retired    Comment: [employer]  Social Needs  . Financial resource strain: Not on file  . Food insecurity:    Worry: Not on file    Inability: Not on file  . Transportation needs:    Medical: Not on file    Non-medical: Not on file  Tobacco Use  . Smoking status: Never Smoker  . Smokeless tobacco: Never Used  Substance and Sexual Activity  . Alcohol use: No    Alcohol/week: 0.0 standard drinks  . Drug use: No  . Sexual activity: Not on file  Lifestyle  . Physical activity:    Days per week: Not on file    Minutes per session: Not on file  . Stress: Not on file  Relationships  . Social connections:    Talks on phone: Not on file    Gets together: Not on file    Attends religious service: Not on file     Active member of club or organization: Not on file    Attends meetings of clubs or organizations: Not on file    Relationship status: Not on file  Other Topics Concern  . Not on file  Social History Narrative   Resides at [place] History  Problem Relation Age of Onset  . Rheum arthritis Mother   . Heart attack Father        not certain of COD  . Hypertension  Paternal Grandfather   . Stroke Paternal Grandfather   . Cirrhosis Paternal Grandmother    Scheduled Meds: . amantadine  100 mg Oral BID  . carbidopa-levodopa  0.5 tablet Oral TID  . docusate sodium  100 mg Oral BID  . Influenza vac split quadrivalent PF  0.5 mL Intramuscular Tomorrow-1000  . loratadine  10 mg Oral Daily  . polyethylene glycol  17 g Oral Daily  . senna  1 tablet Oral QHS   Continuous Infusions: . ceFEPime (MAXIPIME) IV    . lactated ringers 75 mL/hr at 03/21/18 0818  . metronidazole 500 mg (03/21/18 1221)   PRN Meds:.ondansetron **OR** ondansetron (ZOFRAN) IV, traMADol Allergies  Allergen Reactions  . Sulfa Antibiotics Rash  . Azilect [Rasagiline] Rash  . Fenofibrate Rash  . Other     baza cream   . Sinemet [Carbidopa-Levodopa] Rash  . Tape Rash and Other (See Comments)    Redness  . Tomato Rash    Pt can eat raw tomatoes.Marland Kitchenate tomato sandwich a few days ago   Review of Systems  Unable to perform ROS: Acuity of condition    Physical Exam  Constitutional: She appears cachectic. She appears toxic.  Thin, frail, elderly Temporal muscle wasting  Cardiovascular: Normal rate and regular rhythm.  Pulmonary/Chest: Effort normal and breath sounds normal. No accessory muscle usage. No tachypnea. No respiratory distress.  Shallow breathes   Abdominal: Soft. Normal appearance and bowel sounds are normal.  Neurological: She is unresponsive.  Nursing note and vitals reviewed.   Vital Signs: BP (!) 147/55   Pulse 61   Temp 97.7 F (36.5 C) (Axillary)   Resp (!) 28   Ht '5\' 1"'$   (1.549 m)   Wt 45.9 kg   SpO2 97%   BMI 19.12 kg/m  Pain Scale: Faces   Pain Score: 0-No pain   SpO2: SpO2: 97 % O2 Device:SpO2: 97 % O2 Flow Rate: .   IO: Intake/output summary:   Intake/Output Summary (Last 24 hours) at 03/21/2018 1351 Last data filed at 03/21/2018 0800 Gross per 24 hour  Intake 3229.26 ml  Output 500 ml  Net 2729.26 ml    LBM: Last BM Date: 03/21/18 Baseline Weight: Weight: 49.9 kg Most recent weight: Weight: 45.9 kg     Palliative Assessment/Data: 10%      Time In/Out: 1315-1345, 1450-1530 Time Total: 70 min Greater than 50%  of this time was spent counseling and coordinating care related to the above assessment and plan.  Signed by: Vinie Sill, NP Palliative Medicine Team Pager # [phone] (M-F 8a-5p) Team Phone # [phone] (Nights/Weekends)

## 2018-03-21 NOTE — Consult Note (Signed)
[facility] Nurse wound consult note Patient evaluated by camera and with the assistance of her primary RN, Nira Conn (who did the measurements). Reason for Consult: "Bilateral ischial tuberosity ulcer management and eval"  Wound type: The patient has multiple wounds on her sacrum.  The the patient's right side of the sacrum is blackened, dry and surrounded by deep purple tissue.  The blackened area is consistent with an unstageable PI, the purple is consistent with a DTPI.   The left sacral area has a very small blackened area and a large dark purple area.  These are also consistent with unstageable and DTPIs.  The patient's upper, posterior thighs are discolored purple and have minimal blanching. Pressure Injury POA: Yes  These areas may well represent skin failure that occurs at the end stage of life.  Dressing procedure/placement/frequency: Cleanse black, purple, discolored areas on buttocks with soap and water.  Pat dry.  Place as many hydrocolloid dressings as needed to cover the affected areas.  These can stay in place up to 5 days.   The hydrocolloids will begin the autolytic debridement process, allowing the body to begin breaking down the necrotic tissue.  When this occurs, what is now black and dry will likely become soft, gooey, and brown/yellow. These dressings are also resistant to urine and stool. Additional care measures include turning side to side, and limiting the amount of time the patient is supine. Thank you for the consult.  Discussed plan of care with the patient and bedside nurse.  [facility] nurse will not follow at this time.  Please re-consult the [facility] team if needed.  Val Riles, RN, MSN, CWOCN, CNS-BC, pager [phone]

## 2018-03-21 NOTE — Care Management (Signed)
Received call from Catherine [name] of [facility]. Patient is receiving nursing services for a pressure ulcer at [facility]

## 2018-03-22 DIAGNOSIS — F0280 Dementia in other diseases classified elsewhere without behavioral disturbance: Secondary | ICD-10-CM

## 2018-03-22 DIAGNOSIS — E039 Hypothyroidism, unspecified: Secondary | ICD-10-CM

## 2018-03-22 DIAGNOSIS — G20 Parkinson's disease: Secondary | ICD-10-CM

## 2018-03-22 LAB — BASIC METABOLIC PANEL
Anion gap: 8 (ref 5–15)
BUN: 17 mg/dL (ref 8–23)
CHLORIDE: 111 mmol/L (ref 98–111)
CO2: 23 mmol/L (ref 22–32)
CREATININE: 0.59 mg/dL (ref 0.44–1.00)
Calcium: 8.3 mg/dL — ABNORMAL LOW (ref 8.9–10.3)
GFR calc Af Amer: >60 mL/min (ref >60)
GFR calc non Af Amer: >60 mL/min (ref >60)
Glucose, Bld: 109 mg/dL — ABNORMAL HIGH (ref 70–99)
Potassium: 3.2 mmol/L — ABNORMAL LOW (ref 3.5–5.1)
Sodium: 142 mmol/L (ref 135–145)

## 2018-03-22 LAB — CBC
HCT: 39.5 % (ref 36.0–46.0)
Hemoglobin: 13.2 g/dL (ref 12.0–15.0)
MCH: 32.5 pg (ref 26.0–34.0)
MCHC: 33.4 g/dL (ref 30.0–36.0)
MCV: 97.3 fL (ref 80.0–100.0)
PLATELETS: 153 10*3/uL (ref 150–400)
RBC: 4.06 MIL/uL (ref 3.87–5.11)
RDW: 13 % (ref 11.5–15.5)
WBC: 11.1 10*3/uL — ABNORMAL HIGH (ref 4.0–10.5)
nRBC: 0 % (ref 0.0–0.2)

## 2018-03-22 LAB — PROCALCITONIN: Procalcitonin: 1.66 ng/mL

## 2018-03-22 LAB — LACTIC ACID, PLASMA: LACTIC ACID, VENOUS: 0.9 mmol/L (ref 0.5–1.9)

## 2018-03-22 MED ORDER — SODIUM CHLORIDE 0.9 % IV SOLN
3.0000 g | Freq: Three times a day (TID) | INTRAVENOUS | Status: DC
  Administered 2018-03-22 – 2018-03-23 (×3): 3 g via INTRAVENOUS
  Filled 2018-03-22 (×8): qty 3

## 2018-03-22 NOTE — Progress Notes (Signed)
SLP Cancellation Note  Patient Details Name: Catherine [name] MRN: [id number] DOB: [birth year]   Cancelled treatment:       Reason Eval/Treat Not Completed: Fatigue/lethargy limiting ability to participate. Pt continues to be inappropriate for PO at this time secondary to lethargy. ST will continue to follow for PO readiness  Gian Ybarra H. Roddie Mc, CCC-SLP Speech Language Pathologist    Wende Bushy 03/22/2018, 3:20 PM

## 2018-03-22 NOTE — Progress Notes (Signed)
Daily Progress Note   Patient Name: TANISHIA LEMASTER       Date: 03/22/2018 DOB: 1927-07-04  Age: 84 y.o. MRN#: [id number] Attending Physician: Barton Dubois, MD Primary Care Physician: Etter Sjogren, DO Admit Date: 03/20/2018  Reason for Consultation/Follow-up: Establishing goals of care  Subjective: Ms. Kawamoto continues to be somnolent. She is trying to open her eyes and opens them just very slightly and briefly today. Not following commands.   Length of Stay: 2  Current Medications: Scheduled Meds:  . amantadine  100 mg Oral BID  . carbidopa-levodopa  0.5 tablet Oral TID  . docusate sodium  100 mg Oral BID  . Influenza vac split quadrivalent PF  0.5 mL Intramuscular Tomorrow-1000  . loratadine  10 mg Oral Daily  . polyethylene glycol  17 g Oral Daily  . senna  1 tablet Oral QHS    Continuous Infusions: . ceFEPime (MAXIPIME) IV 1 g (03/21/18 1300)  . lactated ringers 75 mL/hr at 03/22/18 0645  . metronidazole Stopped (03/22/18 0510)    PRN Meds: ondansetron **OR** ondansetron (ZOFRAN) IV, traMADol  Physical Exam  Constitutional: She appears cachectic. She appears ill.  Thin, frail, elderly Temporal muscle wasting  Cardiovascular: Normal rate.  Pulmonary/Chest: Effort normal and breath sounds normal. No accessory muscle usage. No tachypnea. No respiratory distress.  Breathes still shallow but deeper than yesterday  Abdominal: Soft. Normal appearance and bowel sounds are normal.  Neurological:  Somnolent             Vital Signs: BP (!) 157/82   Pulse 92   Temp 98.5 F (36.9 C) (Axillary)   Resp (!) 24   Ht '5\' 1"'$  (1.549 m)   Wt 45.4 kg   SpO2 95%   BMI 18.91 kg/m  SpO2: SpO2: 95 % O2 Device: O2 Device: Room Air O2 Flow Rate:    Intake/output summary:     Intake/Output Summary (Last 24 hours) at 03/22/2018 1051 Last data filed at 03/22/2018 0645 Gross per 24 hour  Intake 2228.26 ml  Output 1450 ml  Net 778.26 ml   LBM: Last BM Date: 03/19/18 Baseline Weight: Weight: 49.9 kg Most recent weight: Weight: 45.4 kg       Palliative Assessment/Data: 20%    Flowsheet Rows     Most Recent Value  Intake  Tab  Referral Department  Hospitalist  Unit at Time of Referral  ICU  Palliative Care Primary Diagnosis  Sepsis/Infectious Disease  Date Notified  03/21/18  Palliative Care Type  New Palliative care  Reason for referral  Clarify Goals of Care  Date of Admission  03/20/18  Date first seen by Palliative Care  03/21/18  # of days Palliative referral response time  0 Day(s)  # of days IP prior to Palliative referral  1  Clinical Assessment  Psychosocial & Spiritual Assessment  Palliative Care Outcomes      Patient Active Problem List   Diagnosis Date Noted  . Goals of care, counseling/discussion   . Palliative care encounter   . Sepsis ([place]) 03/20/2018  . Sepsis due to urinary tract infection ([place]) 03/20/2018  . Confusion and disorientation 02/03/2017  . Hypothyroidism 02/03/2017  . Gait abnormality 07/15/2016  . Generalized weakness 04/04/2016  . CAD (coronary artery disease) 04/04/2016  . Pressure injury of skin 04/04/2016  . UTI (urinary tract infection) 01/04/2016  . Acute encephalopathy 01/02/2016  . NSTEMI (non-ST elevated myocardial infarction) ([place]) 01/02/2016  . HTN (hypertension) 01/02/2016  . Pressure ulcer 01/02/2016  . Altered mental status 01/01/2016  . Dementia in Parkinson's disease ([place]) 12/25/2015  . Parkinson's disease ([place]) 05/24/2015  . Parkinsonian tremor ([place]) 05/24/2015  . Left displaced femoral neck fracture ([place]) 05/23/2015  . Closed left hip fracture ([place])   . Hip fracture ([place]) 05/22/2015  . Weakness 12/22/2014  . Ataxia 12/22/2014  . PAT (paroxysmal atrial tachycardia) ([place]) 01/25/2013   . Palpitations 01/11/2013  . Paralysis agitans ([place]) 09/20/2012  . Type 2 diabetes mellitus ([place]) 07/21/2011  . Hyperlipidemia 07/21/2011    Palliative Care Assessment & Plan   HPI: 84 y.o. female  with past medical history of Parkinson's, dementia, DM, HTN, HLD, h/o NSTEMI, h/o SVT anxiety/depression, recurrent UTI admitted on 03/20/2018 from [place] with UTI sepsis with AKI and continued to be largely unresponsive. CT abd/pelvis demonstrates fluid collection in uterus with recommendations for no immediate interventions from IR or general surgery with plans for repeat imaging with contrast after enema and as renal function allows for contrast. She is also unable to take her Parkinson's medications given her current unresponsive state and inability to take po.    Assessment: I met again today at Ms. Pharo's bedside. Her caregiver (for the past 2.5 yrs) and her HCPOA Mr. Linus Salmons are at bedside. We discussed the follow up CT scan and that vital signs and labs are stable. We discussed that the biggest issue is that she is still extremely lethargic which is very different from her baseline that Mr. Comer explained was just this past Friday he witnessed. See my consult note from yesterday for more info on baseline status. They also confirm that she is NOT incontinent of urine or stool prior to admission. I explained that Dr. Dyann Kief will be around and let us know if there are any further recommendations for care.   Mr. Linus Salmons and caregivers continue to hope for improvement. Ms. Coiner does appear to be trying to open eyes and they say she has opened eyes for them. This would be a small improvement from yesterday. Continues antibiotics and interventions to treat the treatable. I did give Mr. Comer a copy of [facility]. He understands that even if Ms. Didonato improves this admission that her overall prognosis is poor with Parkinson's and dementia.   All questions/concerns addressed. Emotional support  provided.   Recommendations/Plan:  AMS: Medical  team treating UTI and following for any other source of infection or cause for AMS. Avoid sedating medications. Delirium precautions.   Goals of Care and Additional Recommendations:  Limitations on Scope of Treatment: Treat the treatable.   Code Status:  DNR  Prognosis:   To be determined. Depending on outcome/progress over the next few days.   Discharge Planning:  To Be Determined. If improved back to [facility] with 24/7 private caregivers. Will discuss hospice options as appropriate over the next couple days.   Thank you for allowing the Palliative Medicine Team to assist in the care of this patient.   Total Time 35 min Prolonged Time Billed  no       Greater than 50%  of this time was spent counseling and coordinating care related to the above assessment and plan.  Vinie Sill, NP Palliative Medicine Team Pager # [phone] (M-F 8a-5p) Team Phone # [phone] (Nights/Weekends)

## 2018-03-22 NOTE — Progress Notes (Signed)
Pharmacy Antibiotic Note  Catherine [name] is a 84 y.o. female admitted on 03/20/2018 with intra-abdominal infection, aspiration pneumonia and UTI.  Pharmacy has been consulted for vancomycin and cefepime  dosing.  Plan: Unasyn 3000 mg IV every 8 hours Monitor labs, c/s, and patient improvement.    Height: 5\' 1"  (154.9 cm) Weight: 100 lb 1.4 oz (45.4 kg) IBW/kg (Calculated) : 47.8  Temp (24hrs), Avg:98.8 F (37.1 C), Min:98.5 F (36.9 C), Max:99 F (37.2 C)  Recent Labs  Lab 03/20/18 1234 03/20/18 1255 03/20/18 1431 03/21/18 0337 03/22/18 0400  WBC 14.4*  --   --  11.8* 11.1*  CREATININE 1.44*  --   --  0.97 0.59  LATICACIDVEN 2.1* 2.62* 1.2  --  0.9    Estimated Creatinine Clearance: 34.8 mL/min (by C-G formula based on SCr of 0.59 mg/dL).    Allergies  Allergen Reactions  . Sulfa Antibiotics Rash  . Azilect [Rasagiline] Rash  . Fenofibrate Rash  . Other     baza cream   . Sinemet [Carbidopa-Levodopa] Rash  . Tape Rash and Other (See Comments)    Redness  . Tomato Rash    Pt can eat raw tomatoes.Marland Kitchenate tomato sandwich a few days ago    Antimicrobials this admission: 11/11 metronidazole >>  11/13 11/11 cefepime>> 11/13 11/11 vancomycin >>  11/12 Unasyn 11/13 >>     Microbiology results: 11/11 BCx2: ngtd  11/11 MRSA PCR: negative    Thank you for allowing pharmacy to be a part of this patient's care.  Ramond Craver 03/22/2018 6:56 PM

## 2018-03-22 NOTE — Progress Notes (Signed)
PROGRESS NOTE    Catherine [name]  NTI:[id number] DOB: 10/26/1927 DOA: 03/20/2018 PCP: Etter Sjogren, DO   Brief Narrative:   Catherine [name] is a 84 y.o. female with medical history significant of Parkinson's disease with associated dementia, anxiety/depression, recurrent UTI who presents from nursing home with altered mental status and found to have sepsis possibly from a urinary source.  Patient cannot provide any history due to her current medical condition and mental state.  She was admitted with sepsis secondary to UTI, but urine cultures did not appear to have been collected.  She was started empirically on cefepime, metronidazole, and vancomycin.  She continues to remain quite somnolent and sedating agents have been withheld.  She was also noted to have AKI which is improving.  She is also noted to have sacral skin breakdown which has been assessed by wound care nurse.  Assessment & Plan:   Principal Problem:   Sepsis ([place]) Active Problems:   Type 2 diabetes mellitus (HCC)   PAT (paroxysmal atrial tachycardia) (HCC)   Parkinson's disease (HCC)   Dementia in Parkinson's disease ([place])   Altered mental status   UTI (urinary tract infection)   Generalized weakness   Hypothyroidism   Sepsis due to urinary tract infection (HCC)   Goals of care, counseling/discussion   Palliative care encounter   Sepsis: Unspecified source -No urine culture taken -Cystitis and inflammatory changes appreciated on CT scan -Patient has received treatment with vancomycin, cefepime and Flagyl; now narrow to Unasyn -Patient is afebrile and otherwise hemodynamically stable. -Continue fluid resuscitation.   -MRSA swab negative, vancomycin discontinued - Blood culture has remained without any growth. -Follow-up CT abdomen pelvis with contrast demonstrated improvement in fluid collection behind her bladder suggesting no acute abscess, recent changes of colitis, cystitis and worsening changes in the  sacral decubitus pressure injury.   -Follow clinical response.  Acute metabolic encephalopathy secondary to above: Per discussion with the patient's significant other the patient can normally converse coherently though she is bedbound/wheelchair bound.  This is likely secondary to sepsis. -Hold sedating medications and continue to monitor with frequent neurochecks -follow further discussion and rec's by palliative care -patient may set a high risk for aspiration and is currently unable to take p.o.'s or following any commands.  AKI-improving; non-oliguric: Likely secondary to sepsis. -Renal function has improved with fluid resuscitation -Continue avoiding nephrotoxins -Continue gentle fluid resuscitation.   History of hyperthyroidism: This is on her problem list but she is not on any medication for it. -TSH WNL  Parkinson's disease, gated by dementia: Suspect patient has dementia with Lewy bodies. - Hold amantadine 100 mg twice daily -Hold carbidopa-levodopa half tablet 3 times daily -Given ongoing inability to take by mouth medications.  Pain/psych: -Hold lorazepam 0.5 mg twice daily as needed -Hold trazodone 25 mg nightly -Hold quetiapine 25 mg every morning and 75 mg nightly -Hold mirtazapine 7.5 mg nightly  -Given ongoing inability to take p.o.'s.   DVT prophylaxis: Heparin changed to SCDs due to thrombocytopenia Code Status: DNR Family Communication: Caretaker at bedside Disposition Plan: Continue monitoring; antibiotics narrowed to Unasyn.  Patient remains nonresponsive, no eating and a high risk for aspiration.  Palliative care consult appreciated and will follow recommendations.    Consultants:   Palliative care  Procedures:   None  Antimicrobials:   cefepime, and Flagyl 11/11---11/13  Vancomycin discontinued 11/11---11/12  unasyn 11/13   Subjective: Patient seen and examined Naitik this morning quite somnolent and unable to follow any commands.  Opening her eyes intermittently spontaneously.  No fever and with a stable vital signs.  Objective: Vitals:   03/22/18 1115 03/22/18 1200 03/22/18 1300 03/22/18 1547  BP:  (!) 150/87 (!) 159/86   Pulse:  72 89   Resp:  (!) 33 (!) 27   Temp: 98.7 F (37.1 C)   99 F (37.2 C)  TempSrc: Axillary   Axillary  SpO2:  98% 98%   Weight:      Height:        Intake/Output Summary (Last 24 hours) at 03/22/2018 1833 Last data filed at 03/22/2018 1444 Gross per 24 hour  Intake 1772.96 ml  Output 1400 ml  Net 372.96 ml   Filed Weights   03/20/18 1800 03/21/18 0500 03/22/18 0500  Weight: 49.5 kg 45.9 kg 45.4 kg    Examination: General exam: Afebrile, patient is somnolent and unable to follow commands.  Opening her eyes spontaneously.  No eating. Respiratory system: Respiratory effort normal, good oxygen saturation on room air, positive rhonchi on auscultation.  No using accessory muscles.   Cardiovascular system: RRR. No murmurs, rubs, gallops. Gastrointestinal system: Abdomen is nondistended, soft and nontender. No organomegaly or masses felt. Normal bowel sounds heard. Central nervous system: Intermittently opening her eyes spontaneously, unable to follow commands, very spastic on exam.   Extremities: No cyanosis, no clubbing. Skin: No petechiae; 2 unstageable pressure injuries appreciated in her section/buttocks area and deep tissue injury affecting both heels.    Data Reviewed: I have personally reviewed following labs and imaging studies  CBC: Recent Labs  Lab 03/20/18 1234 03/21/18 0337 03/22/18 0400  WBC 14.4* 11.8* 11.1*  NEUTROABS 12.8*  --   --   HGB 14.7 12.1 13.2  HCT 43.1 37.0 39.5  MCV 96.2 97.9 97.3  PLT 217 144* 818   Basic Metabolic Panel: Recent Labs  Lab 03/20/18 1234 03/21/18 0337 03/22/18 0400  NA 144 146* 142  K 3.8 3.6 3.2*  CL 109 117* 111  CO2 21* 23 23  GLUCOSE 243* 143* 109*  BUN 26* 27* 17  CREATININE 1.44* 0.97 0.59  CALCIUM 9.2 8.1*  8.3*   GFR: Estimated Creatinine Clearance: 34.8 mL/min (by C-G formula based on SCr of 0.59 mg/dL).   Liver Function Tests: Recent Labs  Lab 03/20/18 1234  AST 23  ALT 19  ALKPHOS 74  BILITOT 1.2  PROT 6.9  ALBUMIN 3.7   Coagulation Profile: Recent Labs  Lab 03/20/18 1234  INR 1.30   Thyroid Function Tests: Recent Labs    03/20/18 1234  TSH 0.981   Sepsis Labs: Recent Labs  Lab 03/20/18 1234 03/20/18 1255 03/20/18 1431 03/21/18 0337 03/22/18 0400  PROCALCITON  --   --   --  3.09 1.66  LATICACIDVEN 2.1* 2.62* 1.2  --  0.9    Recent Results (from the past 240 hour(s))  Culture, blood (Routine x 2)     Status: None (Preliminary result)   Collection Time: 03/20/18 12:35 PM  Result Value Ref Range Status   Specimen Description BLOOD RIGHT FOREARM  Final   Special Requests   Final    BOTTLES DRAWN AEROBIC AND ANAEROBIC Blood Culture adequate volume   Culture   Final    NO GROWTH 2 DAYS Performed at [facility], [address], [place],  [address]    Report Status PENDING  Incomplete  Culture, blood (Routine x 2)     Status: None (Preliminary result)   Collection Time: 03/20/18  1:09 PM  Result  Value Ref Range Status   Specimen Description BLOOD RIGHT HAND  Final   Special Requests   Final    BOTTLES DRAWN AEROBIC AND ANAEROBIC Blood Culture adequate volume   Culture   Final    NO GROWTH 2 DAYS Performed at [facility], [address] [place], [place] [address]    Report Status PENDING  Incomplete  MRSA PCR Screening     Status: None   Collection Time: 03/20/18  4:36 PM  Result Value Ref Range Status   MRSA by PCR NEGATIVE NEGATIVE Final    Comment:        The GeneXpert MRSA Assay (FDA approved for NASAL specimens only), is one component of a comprehensive MRSA colonization surveillance program. It is not intended to diagnose MRSA infection nor to guide or monitor treatment for MRSA infections. Performed at [facility], [address] [place], [place] [address]      Radiology Studies: Ct Abdomen Pelvis W Contrast  Result Date: 03/21/2018 CLINICAL DATA:  84 year old female with abdominal pain. Recent UTI. Prior appendectomy. Subsequent encounter. EXAM: CT ABDOMEN AND PELVIS WITH CONTRAST TECHNIQUE: Multidetector CT imaging of the abdomen and pelvis was performed using the standard protocol following bolus administration of intravenous contrast. CONTRAST:  130mL ISOVUE-300 IOPAMIDOL (ISOVUE-300) INJECTION 61% COMPARISON:  03/20/2018 CT. FINDINGS: Lower chest: Small granulomas lung bases. Heart size within normal limits. Hepatobiliary: No worrisome hepatic lesion. No calcified gallstones or pericholecystic inflammation. Pancreas: No pancreatic mass or inflammation. Spleen: No splenic mass or enlargement. Adrenals/Urinary Tract: Nonobstructing left renal calculi measuring up to 9 mm. Parapelvic cysts. No renal or adrenal mass. Contracted urinary bladder with circumferential wall thickening. Mild mucosal enhancement may be normal although cannot exclude changes of mild cystitis. No mass identified. Stomach/Bowel: Mild circumferential wall thickening descending colon and sigmoid colon. Although this may be partially explained by under distension, possibility of colitis is raised. This represents a change from yesterday's exam. Moderate stool rectosigmoid region. Portions of the stomach, small bowel and colon are under distended limiting evaluation. Vascular/Lymphatic: Atherosclerotic changes aorta and aortic branch vessels. No abdominal aortic aneurysm or large vessel occlusion. Scattered normal size lymph nodes. Reproductive: Decrease in amount of gas and fluid within the cervical canal. The fact that this has decreased in size suggests this is not an abscess. Other: Increase in size of sacral decubitus with inflammatory extension to the sacrum. Musculoskeletal: Prominent degenerative changes L5-S1. Post left hip replacement. IMPRESSION: 1.  Decrease in amount of gas and fluid within the cervical canal. The fact that this has decreased in size suggests this is not an abscess. 2. Increase in size of sacral decubitus (now moderate in size) with inflammatory extension to the sacrum. 3. Mild circumferential wall thickening descending colon and sigmoid colon. Although this may be partially explained by under distension, possibility of colitis is raised. This represents a change from yesterday's exam. 4. Moderate stool rectosigmoid region. 5. Nonobstructing left renal calculi measuring up to 9 mm. 6. Contracted urinary bladder with circumferential wall thickening. Mild mucosal enhancement may be normal although cannot exclude changes of mild cystitis. 7.  Aortic Atherosclerosis (ICD10-I70.0). Electronically Signed   By: Genia Del M.D.   On: 03/21/2018 15:57   Scheduled Meds: . amantadine  100 mg Oral BID  . carbidopa-levodopa  0.5 tablet Oral TID  . docusate sodium  100 mg Oral BID  . Influenza vac split quadrivalent PF  0.5 mL Intramuscular Tomorrow-1000  . loratadine  10 mg Oral Daily  .  polyethylene glycol  17 g Oral Daily  . senna  1 tablet Oral QHS   Continuous Infusions: . lactated ringers 75 mL/hr at 03/22/18 1628     LOS: 2 days    Time spent: 30 minutes   Barton Dubois, MD [facility] Pager [phone]  If 7PM-7AM, please contact night-coverage [email or web address] Password [facility] 03/22/2018, 6:33 PM

## 2018-03-23 LAB — PROCALCITONIN: PROCALCITONIN: 0.76 ng/mL

## 2018-03-23 MED ORDER — GLYCOPYRROLATE 0.2 MG/ML IJ SOLN: 0.2000 mg | INTRAMUSCULAR | Status: DC | PRN

## 2018-03-23 MED ORDER — POLYVINYL ALCOHOL 1.4 % OP SOLN: 1.0000 [drp] | Freq: Four times a day (QID) | OPHTHALMIC | Status: DC | PRN

## 2018-03-23 MED ORDER — ACETAMINOPHEN 650 MG RE SUPP: 650.0000 mg | Freq: Four times a day (QID) | RECTAL | Status: DC | PRN

## 2018-03-23 MED ORDER — BIOTENE DRY MOUTH MT LIQD: 15.0000 mL | OROMUCOSAL | Status: DC | PRN

## 2018-03-23 MED ORDER — HALOPERIDOL LACTATE 5 MG/ML IJ SOLN: 0.5000 mg | INTRAMUSCULAR | Status: DC | PRN

## 2018-03-23 MED ORDER — BISACODYL 10 MG RE SUPP: 10.0000 mg | Freq: Every day | RECTAL | Status: DC | PRN

## 2018-03-23 MED ORDER — HALOPERIDOL LACTATE 2 MG/ML PO CONC
0.5000 mg | ORAL | Status: DC | PRN
  Filled 2018-03-23: qty 0.3

## 2018-03-23 MED ORDER — MORPHINE SULFATE (CONCENTRATE) 10 MG/0.5ML PO SOLN: 5.0000 mg | ORAL | Status: DC | PRN

## 2018-03-23 MED ORDER — ONDANSETRON HCL 4 MG/2ML IJ SOLN: 4.0000 mg | Freq: Four times a day (QID) | INTRAMUSCULAR | Status: DC | PRN

## 2018-03-23 NOTE — Progress Notes (Signed)
Daily Progress Note   Patient Name: Catherine [name]       Date: 03/23/2018 DOB: 1927-08-13  Age: 84 y.o. MRN#: [id number] Attending Physician: Barton Dubois, MD Primary Care Physician: Etter Sjogren, DO Admit Date: 03/20/2018  Reason for Consultation/Follow-up: Establishing goals of care  Subjective: Catherine [name] continues to be somnolent with no response to me. The only response I did get was tears and crying when I was speaking to sitter at bedside (who was asking about feeding tube). We attempted to calm Catherine [name].   Length of Stay: 3  Current Medications: Scheduled Meds:  . amantadine  100 mg Oral BID  . carbidopa-levodopa  0.5 tablet Oral TID  . docusate sodium  100 mg Oral BID  . Influenza vac split quadrivalent PF  0.5 mL Intramuscular Tomorrow-1000  . loratadine  10 mg Oral Daily  . polyethylene glycol  17 g Oral Daily  . senna  1 tablet Oral QHS    Continuous Infusions: . ampicillin-sulbactam (UNASYN) IV Stopped (03/23/18 0417)  . lactated ringers 75 mL/hr at 03/23/18 0652    PRN Meds: ondansetron **OR** ondansetron (ZOFRAN) IV, traMADol  Physical Exam  Constitutional: She appears cachectic. She appears ill.  Thin, frail, elderly Temporal muscle wasting  Cardiovascular: Normal rate.  Pulmonary/Chest: Effort normal and breath sounds normal. No accessory muscle usage. No tachypnea. No respiratory distress.  Breathes still shallow but deeper than yesterday  Abdominal: Soft. Normal appearance and bowel sounds are normal.  Neurological:  Somnolent             Vital Signs: BP (!) 146/84   Pulse 89   Temp 98.5 F (36.9 C) (Axillary)   Resp 16   Ht 5\' 1"  (1.549 m)   Wt 46.6 kg   SpO2 97%   BMI 19.41 kg/m  SpO2: SpO2: 97 % O2 Device: O2 Device: Room Air O2  Flow Rate:    Intake/output summary:   Intake/Output Summary (Last 24 hours) at 03/23/2018 1051 Last data filed at 03/23/2018 2633 Gross per 24 hour  Intake 1894.38 ml  Output 1175 ml  Net 719.38 ml   LBM: Last BM Date: 03/23/18 Baseline Weight: Weight: 49.9 kg Most recent weight: Weight: 46.6 kg       Palliative Assessment/Data: 20%    Flowsheet Rows  Most Recent Value  Intake Tab  Referral Department  Hospitalist  Unit at Time of Referral  ICU  Palliative Care Primary Diagnosis  Sepsis/Infectious Disease  Date Notified  03/21/18  Palliative Care Type  New Palliative care  Reason for referral  Clarify Goals of Care  Date of Admission  03/20/18  Date first seen by Palliative Care  03/21/18  # of days Palliative referral response time  0 Day(s)  # of days IP prior to Palliative referral  1  Clinical Assessment  Psychosocial & Spiritual Assessment  Palliative Care Outcomes      Patient Active Problem List   Diagnosis Date Noted  . Goals of care, counseling/discussion   . Palliative care encounter   . Sepsis ([place]) 03/20/2018  . Sepsis due to urinary tract infection ([place]) 03/20/2018  . Confusion and disorientation 02/03/2017  . Hypothyroidism 02/03/2017  . Gait abnormality 07/15/2016  . Generalized weakness 04/04/2016  . CAD (coronary artery disease) 04/04/2016  . Pressure injury of skin 04/04/2016  . UTI (urinary tract infection) 01/04/2016  . Acute encephalopathy 01/02/2016  . NSTEMI (non-ST elevated myocardial infarction) ([place]) 01/02/2016  . HTN (hypertension) 01/02/2016  . Pressure ulcer 01/02/2016  . Altered mental status 01/01/2016  . Dementia in Parkinson's disease ([place]) 12/25/2015  . Parkinson's disease ([place]) 05/24/2015  . 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 Left displaced femoral neck fracture ([place]) 05/23/2015  . Closed left hip fracture ([place])   . Hip fracture ([place]) 05/22/2015  . Weakness 12/22/2014  . Ataxia 12/22/2014  . PAT (paroxysmal  atrial tachycardia) ([place]) 01/25/2013  . Palpitations 01/11/2013  . Paralysis agitans ([place]) 09/20/2012  . Type 2 diabetes mellitus ([place]) 07/21/2011  . Hyperlipidemia 07/21/2011    Palliative Care Assessment & Plan   HPI: 84 y.o. female  with past medical history of Parkinson's, dementia, DM, HTN, HLD, h/o NSTEMI, h/o SVT anxiety/depression, recurrent UTI admitted on 03/20/2018 from [facility] with UTI sepsis with AKI and continued to be largely unresponsive. CT abd/pelvis demonstrates fluid collection in uterus with recommendations for no immediate interventions from IR or general surgery with plans for repeat imaging with contrast after enema and as renal function allows for contrast. She is also unable to take her Parkinson's medications given her current unresponsive state and inability to take po.    Assessment: I had long discussion with HCPOA, neighbor/longtime friend, Ardyth Gal, and [place] wife (via telephone) regarding Catherine [name]'s continued poor status and poor prognosis. We discussed aggressiveness of care but in the context of Parkinson's, dementia, and poor QOL. They were both clear that Catherine [name] would NOT want a feeding tube. We discussed a plan to focus on comfort recognizing that Catherine [name] is at EOL. They agree that she should not suffer. We discussed where EOL care should be at and they tell me that she has always said she wanted to die at [facility] and she did NOT want to go to hospice (her husband died at hospice facility). However they agree that she will need to have hospice at [facility] to ensure she is comfortable. She also needs a bed as she has been sleeping in a reclining chair but hospice should be able to provide.   Recommendations/Plan:  Medications prn placed to ensure comfort at EOL. Please include prescriptions at d/c to ensure comfort.   Return to [place] with 24/7 caregivers already in place but add hospice care. Will need bed from hospice.   Goals of Care and Additional  Recommendations:  Limitations on Scope of Treatment:  Treat the treatable.   Code Status:  DNR  Prognosis:   < 2 weeks  Discharge Planning:  Hospice at [facility] you for allowing the Palliative Medicine Team to assist in the care of this patient.   Total Time 45 min Prolonged Time Billed  no       Greater than 50%  of this time was spent counseling and coordinating care related to the above assessment and plan.  Vinie Sill, NP Palliative Medicine Team Pager # [phone] (M-F 8a-5p) Team Phone # [phone] (Nights/Weekends)

## 2018-03-23 NOTE — Progress Notes (Signed)
SLP Cancellation Note  Patient Details Name: Catherine [name] MRN: [id number] DOB: 10-09-1927   Cancelled treatment:       Reason Eval/Treat Not Completed: Fatigue/lethargy limiting ability to participate; Acknowledge transition to comfort care. SLP will sign off. Reconsult if indicated.  Thank you,  Genene Churn, [place]    [place] 03/23/2018, 4:35 PM

## 2018-03-23 NOTE — Progress Notes (Signed)
PROGRESS NOTE    Catherine [name]  WCB:[id number] DOB: Apr 10, 1928 DOA: 03/20/2018 PCP: Etter Sjogren, DO   Brief Narrative:   Catherine [name] is a 84 y.o. female with medical history significant of Parkinson's disease with associated dementia, anxiety/depression, recurrent UTI who presents from nursing home with altered mental status and found to have sepsis possibly from a urinary source.  Patient cannot provide any history due to her current medical condition and mental state.  She was admitted with sepsis secondary to UTI, but urine cultures did not appear to have been collected.  She was started empirically on cefepime, metronidazole, and vancomycin.  She continues to remain quite somnolent and sedating agents have been withheld.  She was also noted to have AKI which is improving.  She is also noted to have sacral skin breakdown which has been assessed by wound care nurse.  Assessment & Plan:   Principal Problem:   Sepsis ([place]) Active Problems:   Type 2 diabetes mellitus (HCC)   PAT (paroxysmal atrial tachycardia) (HCC)   Parkinson's disease (HCC)   Dementia in Parkinson's disease ([place])   Altered mental status   UTI (urinary tract infection)   Generalized weakness   Hypothyroidism   Sepsis due to urinary tract infection (HCC)   Goals of care, counseling/discussion   Palliative care encounter   Sepsis: Unspecified source -No urine culture taken -Cystitis and inflammatory changes appreciated on CT scan -Patient has received treatment with vancomycin, cefepime and Flagyl; with subsequently antibiotics narrowed to Unasyn. -Patient is afebrile and otherwise hemodynamically stable. --MRSA swab negative, so vancomycin discontinued - Blood culture has remained without any growth. -Follow-up CT abdomen pelvis with contrast demonstrated improvement in fluid collection behind her bladder suggesting no acute abscess, recent changes of colitis, cystitis and worsening changes in the  sacral decubitus pressure injury.   -As mentioned below after further discussion with healthcare power of attorney decision has remained to focus on full comfort care and not to pursuit any further intervention. -Antibiotic therapy has been discontinued, no further IV fluids and will just focus on symptom management.  Acute metabolic encephalopathy secondary to above: Per discussion with the patient's significant other the patient can normally converse coherently though she is bedbound/wheelchair bound.  This is likely secondary to sepsis. -Hold sedating medications and continue to monitor with frequent neurochecks -follow further discussion and rec's by palliative care -patient remains at a high risk for aspiration and is currently unable to take p.o.'s or following any commands. -After further discussion by palliative care with patient's healthcare power of attorney decision has been made not to pursued any further invasive therapy or artificial nutrition.  Will focus on comfort care and symptom management. -Anticipate discharge back home with hospice care in the next 24 to 48 hours.  AKI-improving; non-oliguric: Likely secondary to sepsis. -Renal function has improved with fluid resuscitation -Continue avoiding nephrotoxins -No further blood work anticipated. -IV fluids will be discontinued and will focus on comfort care only.  History of hyperthyroidism: This is on her problem list but she is not on any medication for it. -TSH WNL during this hospitalization.  Parkinson's disease, gated by dementia: Suspect patient has dementia with Lewy bodies. - Hold amantadine 100 mg twice daily -Hold carbidopa-levodopa half tablet 3 times daily -Given ongoing inability to take by mouth medications. -Plan is to focus on comfort care and symptom management only.  Pain/psych: -Hold lorazepam 0.5 mg twice daily as needed -Hold trazodone 25 mg nightly -Hold quetiapine 25  mg every morning and 75  mg nightly -Hold mirtazapine 7.5 mg nightly  -Given ongoing inability to take p.o.'s. -Plan is to focus on comfort care currently.   DVT prophylaxis: Heparin changed to SCDs due to thrombocytopenia Code Status: DNR Family Communication: Caretaker at bedside Disposition Plan: After further discussion by palliative care in anticipation of her poor prognosis, decision has been made to focus on full comfort care and pursued home hospice for symptom management.     Consultants:   Palliative care  Procedures:   None  Antimicrobials:   cefepime, and Flagyl 11/11---11/13  Vancomycin discontinued 11/11---11/12  unasyn 11/13>>11/14   Subjective: Seen and examined this morning.  Patient has remained lethargic and unable to follow commands or take any p.o.'s.  She is afebrile and otherwise comfortable on exam.  Objective: Vitals:   03/23/18 1200 03/23/18 1300 03/23/18 1400 03/23/18 1500  BP: (!) 170/87 (!) 162/90 (!) 154/82 (!) 162/86  Pulse: 61 86 93 85  Resp: 12 16 12 12   Temp:      TempSrc:      SpO2: 98% 97% 97% 97%  Weight:      Height:        Intake/Output Summary (Last 24 hours) at 03/23/2018 1539 Last data filed at 03/23/2018 1511 Gross per 24 hour  Intake 2022.06 ml  Output 475 ml  Net 1547.06 ml   Filed Weights   03/21/18 0500 03/22/18 0500 03/23/18 0600  Weight: 45.9 kg 45.4 kg 46.6 kg    Examination: General exam: Afebrile, remains lethargic. Not eating and unable to take PO's. Overall comfortable and in no distress.  Respiratory system: Clear to auscultation. Respiratory effort normal. Cardiovascular system:RRR. No murmurs, rubs, gallops. Gastrointestinal system: Abdomen is nondistended, soft and nontender. No organomegaly or masses felt. Normal bowel sounds heard. Central nervous system: Intermittently opening her eyes spontaneously; unable to follow commands, overall very spastic on exam. Extremities: No cyanosis, no clubbing. Skin: No petechiae; 2  unstageable pressure injuries appreciated in her sacrum/buttocks area and there is also deep tissue injury affecting both heels.  Lesions were present at time of admission.  No signs of superimposed infection.  No drainage. Psychiatry: Unable to assess given ongoing encephalopathy.   Data Reviewed: I have personally reviewed following labs and imaging studies  CBC: Recent Labs  Lab 03/20/18 1234 03/21/18 0337 03/22/18 0400  WBC 14.4* 11.8* 11.1*  NEUTROABS 12.8*  --   --   HGB 14.7 12.1 13.2  HCT 43.1 37.0 39.5  MCV 96.2 97.9 97.3  PLT 217 144* 277   Basic Metabolic Panel: Recent Labs  Lab 03/20/18 1234 03/21/18 0337 03/22/18 0400  NA 144 146* 142  K 3.8 3.6 3.2*  CL 109 117* 111  CO2 21* 23 23  GLUCOSE 243* 143* 109*  BUN 26* 27* 17  CREATININE 1.44* 0.97 0.59  CALCIUM 9.2 8.1* 8.3*   GFR: Estimated Creatinine Clearance: 35.8 mL/min (by C-G formula based on SCr of 0.59 mg/dL).   Liver Function Tests: Recent Labs  Lab 03/20/18 1234  AST 23  ALT 19  ALKPHOS 74  BILITOT 1.2  PROT 6.9  ALBUMIN 3.7   Coagulation Profile: Recent Labs  Lab 03/20/18 1234  INR 1.30   Sepsis Labs: Recent Labs  Lab 03/20/18 1234 03/20/18 1255 03/20/18 1431 03/21/18 0337 03/22/18 0400 03/23/18 0426  PROCALCITON  --   --   --  3.09 1.66 0.76  LATICACIDVEN 2.1* 2.62* 1.2  --  0.9  --  Recent Results (from the past 240 hour(s))  Culture, blood (Routine x 2)     Status: None (Preliminary result)   Collection Time: 03/20/18 12:35 PM  Result Value Ref Range Status   Specimen Description BLOOD RIGHT FOREARM  Final   Special Requests   Final    BOTTLES DRAWN AEROBIC AND ANAEROBIC Blood Culture adequate volume   Culture   Final    NO GROWTH 3 DAYS Performed at [facility], [address], [place], [place] [address]    Report Status PENDING  Incomplete  Culture, blood (Routine x 2)     Status: None (Preliminary result)   Collection Time: 03/20/18  1:09 PM  Result Value  Ref Range Status   Specimen Description BLOOD RIGHT HAND  Final   Special Requests   Final    BOTTLES DRAWN AEROBIC AND ANAEROBIC Blood Culture adequate volume   Culture   Final    NO GROWTH 3 DAYS Performed at [facility], [address], [place], [place] [address]    Report Status PENDING  Incomplete  MRSA PCR Screening     Status: None   Collection Time: 03/20/18  4:36 PM  Result Value Ref Range Status   MRSA by PCR NEGATIVE NEGATIVE Final    Comment:        The GeneXpert MRSA Assay (FDA approved for NASAL specimens only), is one component of a comprehensive MRSA colonization surveillance program. It is not intended to diagnose MRSA infection nor to guide or monitor treatment for MRSA infections. Performed at [facility], [address], [place], [place] [address]      Radiology Studies: No results found. Scheduled Meds: . Influenza vac split quadrivalent PF  0.5 mL Intramuscular Tomorrow-1000   Continuous Infusions:    LOS: 3 days    Time spent: 25 minutes   Barton Dubois, MD [facility] Pager [phone]  If 7PM-7AM, please contact night-coverage [email or web address] Password [facility] 03/23/2018, 3:39 PM

## 2018-03-24 DIAGNOSIS — L89156 Pressure-induced deep tissue damage of sacral region: Secondary | ICD-10-CM

## 2018-03-24 DIAGNOSIS — L89620 Pressure ulcer of left heel, unstageable: Secondary | ICD-10-CM

## 2018-03-24 DIAGNOSIS — N390 Urinary tract infection, site not specified: Secondary | ICD-10-CM

## 2018-03-24 DIAGNOSIS — E119 Type 2 diabetes mellitus without complications: Secondary | ICD-10-CM

## 2018-03-24 DIAGNOSIS — L89610 Pressure ulcer of right heel, unstageable: Secondary | ICD-10-CM

## 2018-03-24 MED ORDER — HALOPERIDOL LACTATE 2 MG/ML PO CONC: 0.5000 mg | ORAL | Status: DC | PRN

## 2018-03-24 MED ORDER — BISACODYL 10 MG RE SUPP: 10.0000 mg | Freq: Every day | RECTAL | Status: AC | PRN

## 2018-03-24 MED ORDER — ACETAMINOPHEN 650 MG RE SUPP: 650.0000 mg | Freq: Four times a day (QID) | RECTAL | Status: AC | PRN

## 2018-03-24 MED ORDER — LORAZEPAM 0.5 MG PO TABS: ORAL_TABLET | ORAL | 0 refills | Status: DC

## 2018-03-24 MED ORDER — GLYCOPYRROLATE 0.2 MG/ML IJ SOLN: 0.2000 mg | INTRAMUSCULAR | Status: AC | PRN

## 2018-03-24 MED ORDER — MORPHINE SULFATE (CONCENTRATE) 10 MG/0.5ML PO SOLN: 5.0000 mg | ORAL | 0 refills | Status: AC | PRN

## 2018-03-24 MED ORDER — ONDANSETRON 8 MG PO TBDP: 8.0000 mg | ORAL_TABLET | Freq: Three times a day (TID) | ORAL | Status: AC | PRN

## 2018-03-24 NOTE — Care Management Important Message (Signed)
Important Message  Patient Details  Name: Catherine [name] MRN: [id number] Date of Birth: [birth year]   Medicare Important Message Given:  Yes    Shelda Altes 03/24/2018, 10:50 AM

## 2018-03-24 NOTE — Clinical Social Work Note (Signed)
LCSW working on [employer] number most of the afternoon. At this time, the additional clinical that was sent to them has not been reviewed. Called PASRR at 4:25 to inquire on status. Was informed that the RN reviewers had left for the day and that no decision would be made before Monday. Updated pt's POA that pt would need to remain here over the weekend until the [place] team is back in and can review the papers. He is agreeable to this.  Updated MD, RN, and Marianna Fuss at [facility]. Will follow up Monday.

## 2018-03-24 NOTE — Clinical Social Work Note (Signed)
LCSW following. Pt's POA stated they would like hospice care at [facility] according to pt's previously expressed wishes. Spoke with Sharyn Lull and Olivia Mackie at [facility] and was informed that they would not be able to accept her care due to her unstagable wounds. LCSW discussed with pt's POA and pt's caregiver at bedside. Pt not responsive at this time. POA requests referral to [facility] with hospice  Spoke with Marianna Fuss at [facility] and they can accept pt in semi private room and waiting list for private if POA desires. Spoke with POA to update on same as well as cost of each type of room. POA would like to go forward with this plan.   Updated MD. Will further assist with dc needs once dc orders are entered.

## 2018-03-24 NOTE — NC FL2 (Signed)
[place] LEVEL OF CARE SCREENING TOOL     IDENTIFICATION  Patient Name: Catherine [name] Birthdate: 12-26-1927 Sex: female Admission Date (Current Location): 03/20/2018  [facility] and [place] Number:  [employer] and Address:  [place] [address] [place]      Provider Number: [id number]  Attending Physician Name and Address:  Barton Dubois, MD  Relative Name and Phone Number:  Alla Feeling [phone]    Current Level of Care: Hospital Recommended Level of Care: [place] Prior Approval Number:    Date Approved/Denied:   PASRR Number:    Discharge Plan: SNF    Current Diagnoses: Patient Active Problem List   Diagnosis Date Noted  . Goals of care, counseling/discussion   . Palliative care encounter   . Sepsis ([place]) 03/20/2018  . Sepsis due to urinary tract infection ([place]) 03/20/2018  . Confusion and disorientation 02/03/2017  . Hypothyroidism 02/03/2017  . Gait abnormality 07/15/2016  . Generalized weakness 04/04/2016  . CAD (coronary artery disease) 04/04/2016  . Pressure injury of both heels, unstageable ([place]) 04/04/2016  . UTI (urinary tract infection) 01/04/2016  . Acute encephalopathy 01/02/2016  . NSTEMI (non-ST elevated myocardial infarction) ([place]) 01/02/2016  . HTN (hypertension) 01/02/2016  . Pressure ulcer 01/02/2016  . Altered mental status 01/01/2016  . Dementia in Parkinson's disease ([place]) 12/25/2015  . Parkinson's disease ([place]) 05/24/2015  . 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 Left displaced femoral neck fracture ([place]) 05/23/2015  . Closed left hip fracture ([place])   . Hip fracture ([place]) 05/22/2015  . Weakness 12/22/2014  . Ataxia 12/22/2014  . PAT (paroxysmal atrial tachycardia) ([place]) 01/25/2013  . Palpitations 01/11/2013  . Paralysis agitans ([place]) 09/20/2012  . Type 2 diabetes mellitus ([place]) 07/21/2011  . Hyperlipidemia 07/21/2011    Orientation RESPIRATION BLADDER  Height & Weight        Normal Incontinent Weight: 102 lb 11.8 oz (46.6 kg) Height:  5\' 1"  (154.9 cm)  BEHAVIORAL SYMPTOMS/MOOD NEUROLOGICAL BOWEL NUTRITION STATUS      Incontinent Diet(see dc summary, patient not eating)  AMBULATORY STATUS COMMUNICATION OF NEEDS Skin   Total Care Does not communicate PU Stage and Appropriate Care                       Personal Care Assistance Level of Assistance  Total care           Functional Limitations Info  Sight, Hearing, Speech Sight Info: Adequate Hearing Info: Adequate Speech Info: Impaired    SPECIAL CARE FACTORS FREQUENCY                       Contractures Contractures Info: Not present    Additional Factors Info  Code Status, Allergies Code Status Info: DNR Allergies Info: Sulfa Antibiotics, Azilect, Fenofibrate, Sinemet, Tape, Tomato           Current Medications (03/24/2018):  This is the current hospital active medication list Current Facility-Administered Medications  Medication Dose Route Frequency Provider Last Rate Last Dose  . acetaminophen (TYLENOL) suppository 650 mg  650 mg Rectal F1M PRN Pershing Proud, NP      . antiseptic oral rinse (BIOTENE) solution 15 mL  15 mL Topical PRN Pershing Proud, NP      . bisacodyl (DULCOLAX) suppository 10 mg  10 mg Rectal Daily PRN Pershing Proud, NP      . glycopyrrolate (ROBINUL) injection 0.2 mg  0.2 mg Subcutaneous Z3G PRN Pershing Proud, NP       Or  . glycopyrrolate (ROBINUL) injection 0.2 mg  0.2 mg Intravenous D9M PRN Pershing Proud, NP      . haloperidol (HALDOL) 2 MG/ML solution 0.5 mg  0.5 mg Sublingual E2A PRN Pershing Proud, NP       Or  . haloperidol lactate (HALDOL) injection 0.5 mg  0.5 mg Intravenous S3M PRN Pershing Proud, NP      . Influenza vac split quadrivalent PF (FLUZONE HIGH-DOSE) injection 0.5 mL  0.5 mL Intramuscular Tomorrow-1000 Purohit, Shrey C, MD      . morphine CONCENTRATE 10 MG/0.5ML oral solution 5 mg  5 mg  Sublingual H9Q PRN Pershing Proud, NP      . ondansetron (ZOFRAN) injection 4 mg  4 mg Intravenous Q2W PRN Pershing Proud, NP      . polyvinyl alcohol (LIQUIFILM TEARS) 1.4 % ophthalmic solution 1 drop  1 drop Both Eyes QID PRN Pershing Proud, NP         Discharge Medications: Please see discharge summary for a list of discharge medications.  Relevant Imaging Results:  Relevant Lab Results:   Additional Information End of life care (6 months or less life expectance), hospice referral  Shade Flood, LCSW

## 2018-03-24 NOTE — Discharge Summary (Addendum)
Physician Discharge Summary  Catherine [name] LMB:[id number] DOB: [birth year] DOA: 03/20/2018  PCP: Etter Sjogren, DO  Admit date: 03/20/2018 Discharge date: 03/24/2018  Time spent: 35 minutes  Recommendations for Outpatient Follow-up:  1. No further hospitalization 2. Full comfort care and symptomatic management 3. Hospice service referral for assistant with end-of-life care recommended. 4. Life expectancy less than 6 weeks.    Discharge Diagnoses:  Principal Problem:   Sepsis ([place]) Active Problems:   Type 2 diabetes mellitus (HCC)   PAT (paroxysmal atrial tachycardia) (HCC)   Parkinson's disease (HCC)   Dementia in Parkinson's disease ([place])   Altered mental status   UTI (urinary tract infection)   Generalized weakness   Pressure injury of both heels, unstageable ([place])   Hypothyroidism   Sepsis due to urinary tract infection ([place])   Goals of care, counseling/discussion   Palliative care encounter   Discharge Condition: Hemodynamically stable.  In no acute distress.  Discharge to pain center nursing home for end-of-life care and symptom management.  Diet recommendation: comfort feeding.  Filed Weights   03/21/18 0500 03/22/18 0500 03/23/18 0600  Weight: 45.9 kg 45.4 kg 46.6 kg    History of present illness:  As per H&P written by Dr. Herbert Moors on 03/21/18 84 y.o. female with medical history significant of Parkinson's disease with associated dementia, anxiety/depression, recurrent UTI who presents from nursing home with altered mental status and found to have sepsis possibly from a urinary source.  Patient cannot provide any history due to her current medical condition and mental state.  Patient healthcare power of attorney reports that she apparently became progressively more altered yesterday and was taken to [facility] ER and diagnosed with UTI.  She is apparently not sent back to her nursing home with any antibiotics.  She became progressively more altered and  nonresponsive.  She was noted to be febrile and tachycardic.  She was sent to the emergency department in any pain.  She apparently has not been complaining of any cough, dysuria, congestion, rhinorrhea, chest pain.  She currently cannot participate in review of systems were medical condition.  ED Course: In the ER patient was noted to be febrile to 104.7.  She was noted to be tachycardic.  She met sepsis criteria.  Labs are notable for a creatinine of 1.44 with a baseline of around 0.9.  Lactate was mildly elevated at 2.6.  Analysis was positive for nitrites and leukocytes.  White blood cell count was 14.4 with left shift.  Blood cultures were taken.  Influenza panel is pending.  Chest x-ray showed only chronic changes.  CT abdomen pelvis is pending.  Patient was given cefepime, metronidazole, vancomycin.  Hospital Course:  Sepsis: Unspecified source -No urine culture taken -Cystitis and inflammatory changes appreciated on CT scan -Patient has received treatment with vancomycin, cefepime and Flagyl; with subsequently antibiotics narrowed to Unasyn. -Patient is afebrile and otherwise hemodynamically stable. --MRSA swab negative, so vancomycin discontinued - Blood culture has remained without any growth. -Follow-up CT abdomen pelvis with contrast demonstrated improvement in fluid collection behind her bladder suggesting no acute abscess, recent changes of colitis, cystitis and worsening changes in the sacral decubitus pressure injury.   -As mentioned below after further discussion with healthcare power of attorney decision has remained to focus on full comfort care and not to pursuit any further intervention. -Antibiotic therapy has been discontinued, no further IV fluids, no hospitalization and will just focus on symptom management and full comfort..  Acute metabolic encephalopathy  secondary to above: Per discussion with the patient's significant other the patient can normally converse coherently  though she is bedbound/wheelchair bound. This is likely secondary to sepsis. -Hold sedating medications and continue to monitor with frequent neurochecks -follow further discussion and rec's by palliative care -patient remains at a high risk for aspiration and is currently unable to take p.o.'s or following any commands. -After further discussion by palliative care with patient's healthcare power of attorney decision has been made not to pursued any further invasive therapy or artificial nutrition.  Will focus on comfort care and symptom management. -Patient will be transfer to pain center for end-of-life care and symptomatic management -Hospice referral for assistant with care encouraged.  AKI-improving; non-oliguric: Likely secondary to sepsis. -Renal function has improved with fluid resuscitation. -Continue avoiding nephrotoxins. -No further blood work anticipated. -After further discussion regarding future care, poor prognosis and inability to keep yourself well-hydrated; decision was made to discontinue IV fluids and focus on comfort care and symptomatic management only.  History of hyperthyroidism: This is on her problem list but she is not on any medication for it. -TSH CHECKED and WNL during this hospitalization.  Parkinson's disease, gated by dementia: Suspect patient has dementia with Lewy bodies. -Given ongoing inability to take by mouth medications and overall poor prognosis decision has been made not to pursued any further oral medications and to focus just on comfort care and symptomatic management.  Pain/psych: -Given ongoing inability to take p.o.'s; no medication by mouth will be given. -Plan is to focus on comfort care currently.  Deep tissue injury affecting sacrum and bilateral heels -continue preventive measures -present prior to admission.  -no superimposed infection appreciated.  Procedures:  See below for x-ray reports  Consultations:  Palliative  care  Discharge Exam: Vitals:   03/23/18 2128 03/24/18 0523  BP: (!) 160/88 (!) 162/80  Pulse: 92 79  Resp:    Temp: 98.3 F (36.8 C) 98.1 F (36.7 C)  SpO2: 97% 98%      Discharge Instructions   Discharge Instructions    Discharge instructions   Complete by:  As directed    -needs Hospice referral  -No future hospitalizations -Full comfort care and symptomatic management only     Allergies as of 03/24/2018      Reactions   Sulfa Antibiotics Rash   Azilect [rasagiline] Rash   Fenofibrate Rash   Other    baza cream   Sinemet [carbidopa-levodopa] Rash   Tape Rash, Other (See Comments)   Redness   Tomato Rash   Pt can eat raw tomatoes.Marland Kitchenate tomato sandwich a few days ago      Medication List    STOP taking these medications   acetaminophen 325 MG tablet Commonly known as:  TYLENOL Replaced by:  acetaminophen 650 MG suppository   amantadine 100 MG capsule Commonly known as:  SYMMETREL   carbidopa-levodopa 25-100 MG tablet Commonly known as:  SINEMET IR   cetirizine 10 MG tablet Commonly known as:  ZYRTEC   docusate sodium 100 MG capsule Commonly known as:  COLACE   ENEMA DISPOSABLE RE   lactose free nutrition Liqd   mirtazapine 7.5 MG tablet Commonly known as:  REMERON   multivitamin capsule   ondansetron 4 MG tablet Commonly known as:  ZOFRAN   polyethylene glycol packet Commonly known as:  MIRALAX / GLYCOLAX   QUEtiapine 50 MG tablet Commonly known as:  SEROQUEL   senna 8.6 MG Tabs tablet Commonly known as:  [employer]  traZODone 50 MG tablet Commonly known as:  DESYREL     TAKE these medications   acetaminophen 650 MG suppository Commonly known as:  TYLENOL Place 1 suppository (650 mg total) rectally every 6 (six) hours as needed for mild pain (or Fever >/= 101). Replaces:  acetaminophen 325 MG tablet   bisacodyl 10 MG suppository Commonly known as:  DULCOLAX Place 1 suppository (10 mg total) rectally daily as needed for  moderate constipation.   glycopyrrolate 0.2 MG/ML injection Commonly known as:  ROBINUL Inject 1 mL (0.2 mg total) into the skin every 4 (four) hours as needed (excessive secretions).   haloperidol 2 MG/ML solution Commonly known as:  HALDOL Place 0.3 mLs (0.6 mg total) under the tongue every 4 (four) hours as needed for agitation (or delirium).   LORazepam 0.5 MG tablet Commonly known as:  ATIVAN 1 tablet at night, take 1 tab in the morning if needed   morphine CONCENTRATE 10 MG/0.5ML Soln concentrated solution Place 0.25 mLs (5 mg total) under the tongue every 2 (two) hours as needed for severe pain or shortness of breath.   ondansetron 8 MG disintegrating tablet Commonly known as:  ZOFRAN-ODT Take 1 tablet (8 mg total) by mouth every 8 (eight) hours as needed for nausea or vomiting.      Allergies  Allergen Reactions  . Sulfa Antibiotics Rash  . Azilect [Rasagiline] Rash  . Fenofibrate Rash  . Other     baza cream   . Sinemet [Carbidopa-Levodopa] Rash  . Tape Rash and Other (See Comments)    Redness  . Tomato Rash    Pt can eat raw tomatoes.Marland Kitchenate tomato sandwich a few days ago      The results of significant diagnostics from this hospitalization (including imaging, microbiology, ancillary and laboratory) are listed below for reference.    Significant Diagnostic Studies: Ct Abdomen Pelvis Wo Contrast  Result Date: 03/20/2018 CLINICAL DATA:  84 year old female with fever and UTI. Prior appendectomy. Initial encounter. EXAM: CT ABDOMEN AND PELVIS WITHOUT CONTRAST TECHNIQUE: Multidetector CT imaging of the abdomen and pelvis was performed following the standard protocol without IV contrast. COMPARISON:  No comparison CT. FINDINGS: Lower chest: Small granulomas lung bases/right middle lobe. Scarring/subsegmental atelectasis. Heart size within normal limits. Coronary artery calcification. Calcified aortic valve. Dense breast parenchyma. Hepatobiliary: Taking into account  limitation by non contrast imaging, no worrisome hepatic lesion. Evaluation of gallbladder limited by streak artifact from patient's arm. No calcified gallstones noted. Pancreas: Taking into account limitation by non contrast imaging, no worrisome pancreatic lesion. Spleen: Taking into account limitation by non contrast imaging, no splenic lesion or enlargement. Adrenals/Urinary Tract: No obstructing stone or hydronephrosis. Calcifications left kidney may represent nonobstructing renal calculi and/or vascular calcifications. Taking into account limitation by non contrast imaging, no worrisome renal lesion. Mild hyperplasia adrenal glands bilaterally. No primary urinary bladder abnormality noted. Stomach/Bowel: Prominent stool rectosigmoid region patient at risk for stercoral colitis. Anterior to the stool filled prominent size rectosigmoid region is a gas and fluid collection which is impressing upon the posterior aspect of the bladder. Inferior extent at the expected level of the vagina and therefore this may be gas and fluid within the vagina. Direct visualization may prove helpful. Abscess felt to be a secondary less likely consideration. Vascular/Lymphatic: Atherosclerotic changes aorta with slight ectasia without aneurysm. Scattered normal size lymph nodes. Reproductive: As above.  No worrisome adnexal mass. Other: Mild haziness sacral region may represent sacral decubitus. Musculoskeletal: Degenerative changes L5-S1. IMPRESSION: 1. Anterior to  the stool filled prominent size rectosigmoid region is a gas and fluid collection which is impressing upon the posterior aspect of the bladder. Inferior extent at the expected level of the vagina and therefore this may be gas and fluid within the vagina. Direct visualization may prove helpful. Abscess felt to be a secondary less likely consideration (this possibility can be re-evaluated on follow-up imaging if vaginal exam is not possible). 2. Mild sacral decubitus. 3.  No obstructing stone or hydronephrosis. Calcifications left kidney may represent nonobstructing renal calculi or vascular calcifications. These results were called by telephone at the time of interpretation on 03/20/2018 at 3:55 pm to Dr. Nat Christen , who verbally acknowledged these results. Electronically Signed   By: Genia Del M.D.   On: 03/20/2018 15:56   Dg Chest 2 View  Result Date: 03/13/2018 CLINICAL DATA:  Altered mental status today. EXAM: CHEST - 2 VIEW COMPARISON:  01/23/2018 and prior exams FINDINGS: The cardiomediastinal silhouette is unremarkable. There is no evidence of focal airspace disease, pulmonary edema, suspicious pulmonary nodule/mass, pleural effusion, or pneumothorax. No acute bony abnormalities are identified. IMPRESSION: No active cardiopulmonary disease. Electronically Signed   By: Margarette Canada M.D.   On: 03/13/2018 19:22   Ct Head Wo Contrast  Result Date: 03/13/2018 CLINICAL DATA:  Altered mental status EXAM: CT HEAD WITHOUT CONTRAST TECHNIQUE: Contiguous axial images were obtained from the base of the skull through the vertex without intravenous contrast. COMPARISON:  CT brain 01/23/2018 FINDINGS: Brain: No acute territorial infarction, hemorrhage or intracranial mass. Moderate atrophy. Moderate small vessel ischemic changes of the white matter. Stable ventricle size. Vascular: No hyperdense vessels. Carotid vascular and vertebral artery calcification Skull: Normal. Negative for fracture or focal lesion. Sinuses/Orbits: No acute finding. Other: None IMPRESSION: 1. No CT evidence for acute intracranial abnormality. 2. Atrophy with small vessel ischemic changes of the white matter. Electronically Signed   By: Donavan Foil M.D.   On: 03/13/2018 19:52   Ct Abdomen Pelvis W Contrast  Result Date: 03/21/2018 CLINICAL DATA:  84 year old female with abdominal pain. Recent UTI. Prior appendectomy. Subsequent encounter. EXAM: CT ABDOMEN AND PELVIS WITH CONTRAST TECHNIQUE:  Multidetector CT imaging of the abdomen and pelvis was performed using the standard protocol following bolus administration of intravenous contrast. CONTRAST:  172m ISOVUE-300 IOPAMIDOL (ISOVUE-300) INJECTION 61% COMPARISON:  03/20/2018 CT. FINDINGS: Lower chest: Small granulomas lung bases. Heart size within normal limits. Hepatobiliary: No worrisome hepatic lesion. No calcified gallstones or pericholecystic inflammation. Pancreas: No pancreatic mass or inflammation. Spleen: No splenic mass or enlargement. Adrenals/Urinary Tract: Nonobstructing left renal calculi measuring up to 9 mm. Parapelvic cysts. No renal or adrenal mass. Contracted urinary bladder with circumferential wall thickening. Mild mucosal enhancement may be normal although cannot exclude changes of mild cystitis. No mass identified. Stomach/Bowel: Mild circumferential wall thickening descending colon and sigmoid colon. Although this may be partially explained by under distension, possibility of colitis is raised. This represents a change from yesterday's exam. Moderate stool rectosigmoid region. Portions of the stomach, small bowel and colon are under distended limiting evaluation. Vascular/Lymphatic: Atherosclerotic changes aorta and aortic branch vessels. No abdominal aortic aneurysm or large vessel occlusion. Scattered normal size lymph nodes. Reproductive: Decrease in amount of gas and fluid within the cervical canal. The fact that this has decreased in size suggests this is not an abscess. Other: Increase in size of sacral decubitus with inflammatory extension to the sacrum. Musculoskeletal: Prominent degenerative changes L5-S1. Post left hip replacement. IMPRESSION: 1. Decrease in amount of  gas and fluid within the cervical canal. The fact that this has decreased in size suggests this is not an abscess. 2. Increase in size of sacral decubitus (now moderate in size) with inflammatory extension to the sacrum. 3. Mild circumferential wall  thickening descending colon and sigmoid colon. Although this may be partially explained by under distension, possibility of colitis is raised. This represents a change from yesterday's exam. 4. Moderate stool rectosigmoid region. 5. Nonobstructing left renal calculi measuring up to 9 mm. 6. Contracted urinary bladder with circumferential wall thickening. Mild mucosal enhancement may be normal although cannot exclude changes of mild cystitis. 7.  Aortic Atherosclerosis (ICD10-I70.0). Electronically Signed   By: Genia Del M.D.   On: 03/21/2018 15:57   Dg Chest Port 1 View  Result Date: 03/20/2018 CLINICAL DATA:  Fever and urinary tract infection diagnosed yesterday. EXAM: PORTABLE CHEST 1 VIEW COMPARISON:  PA and lateral chest x-ray of March 13, 2018 FINDINGS: The lungs are borderline hypoinflated. The interstitial markings are coarse though stable allowing for the hypoinflation. There is a stable approximately 4 mm diameter calcified nodule in the right lower lung. The heart and pulmonary vascularity are normal. The mediastinum is normal in width. There is calcification in the wall of the aortic arch. The bony thorax exhibits no acute abnormality. IMPRESSION: Chronic bronchitic changes, stable. No alveolar pneumonia nor CHF. The appearance of the interstitial markings is accentuated by mild hypoinflation. Electronically Signed   By: David  Martinique M.D.   On: 03/20/2018 13:26    Microbiology: Recent Results (from the past 240 hour(s))  Culture, blood (Routine x 2)     Status: None (Preliminary result)   Collection Time: 03/20/18 12:35 PM  Result Value Ref Range Status   Specimen Description BLOOD RIGHT FOREARM  Final   Special Requests   Final    BOTTLES DRAWN AEROBIC AND ANAEROBIC Blood Culture adequate volume   Culture   Final    NO GROWTH 4 DAYS Performed at [facility], [address], [place], [place] [address]    Report Status PENDING  Incomplete  Culture, blood (Routine x 2)      Status: None (Preliminary result)   Collection Time: 03/20/18  1:09 PM  Result Value Ref Range Status   Specimen Description BLOOD RIGHT HAND  Final   Special Requests   Final    BOTTLES DRAWN AEROBIC AND ANAEROBIC Blood Culture adequate volume   Culture   Final    NO GROWTH 4 DAYS Performed at [facility], [address], [place], [place] [address]    Report Status PENDING  Incomplete  MRSA PCR Screening     Status: None   Collection Time: 03/20/18  4:36 PM  Result Value Ref Range Status   MRSA by PCR NEGATIVE NEGATIVE Final    Comment:        The GeneXpert MRSA Assay (FDA approved for NASAL specimens only), is one component of a comprehensive MRSA colonization surveillance program. It is not intended to diagnose MRSA infection nor to guide or monitor treatment for MRSA infections. Performed at [facility], [address] [place],  [address]      Labs: Basic Metabolic Panel: Recent Labs  Lab 03/20/18 1234 03/21/18 0337 03/22/18 0400  NA 144 146* 142  K 3.8 3.6 3.2*  CL 109 117* 111  CO2 21* 23 23  GLUCOSE 243* 143* 109*  BUN 26* 27* 17  CREATININE 1.44* 0.97 0.59  CALCIUM 9.2 8.1* 8.3*   Liver Function  Tests: Recent Labs  Lab 03/20/18 1234  AST 23  ALT 19  ALKPHOS 74  BILITOT 1.2  PROT 6.9  ALBUMIN 3.7   CBC: Recent Labs  Lab 03/20/18 1234 03/21/18 0337 03/22/18 0400  WBC 14.4* 11.8* 11.1*  NEUTROABS 12.8*  --   --   HGB 14.7 12.1 13.2  HCT 43.1 37.0 39.5  MCV 96.2 97.9 97.3  PLT 217 144* 153    Signed:  Barton Dubois MD.  [facility] 03/24/2018, 1:29 PM

## 2018-03-25 LAB — CULTURE, BLOOD (ROUTINE X 2)
CULTURE: NO GROWTH
Culture: NO GROWTH
Special Requests: ADEQUATE
Special Requests: ADEQUATE

## 2018-03-25 NOTE — Clinical Social Work Note (Signed)
The patient's PASRR is still pending. CSW is following.  Santiago Bumpers, MSW, Latanya Presser [phone]

## 2018-03-25 NOTE — Progress Notes (Signed)
PROGRESS NOTE    Catherine [name]  Catherine:[id number] DOB: 06-Jul-1927 DOA: 03/20/2018 PCP: Etter Sjogren, DO   Brief Narrative:   Catherine [name] is a 84 y.o. female with medical history significant of Parkinson's disease with associated dementia, anxiety/depression, recurrent UTI who presents from nursing home with altered mental status and found to have sepsis possibly from a urinary source.  Patient cannot provide any history due to her current medical condition and mental state.  She was admitted with sepsis secondary to UTI, but urine cultures did not appear to have been collected.  She was started empirically on cefepime, metronidazole, and vancomycin.  She continues to remain quite somnolent and sedating agents have been withheld.  She was also noted to have AKI which is improving.  She is also noted to have sacral skin breakdown which has been assessed by wound care nurse.  Assessment & Plan:   Principal Problem:   Sepsis ([name]) Active Problems:   Type 2 diabetes mellitus (HCC)   PAT (paroxysmal atrial tachycardia) (HCC)   Parkinson's disease (HCC)   Dementia in Parkinson's disease ([name])   Altered mental status   UTI (urinary tract infection)   Generalized weakness   Pressure injury of both heels, unstageable ([name])   Hypothyroidism   Sepsis due to urinary tract infection ([name])   Goals of care, counseling/discussion   Palliative care encounter   Sepsis: Unspecified source -No urine culture taken -Cystitis and inflammatory changes appreciated on CT scan -Patient has received treatment with vancomycin, cefepime and Flagyl; with subsequently antibiotics narrowed to Unasyn. -Patient is afebrile and otherwise hemodynamically stable. --MRSA swab negative, so vancomycin discontinued - Blood culture has remained without any growth. -Follow-up CT abdomen pelvis with contrast demonstrated improvement in fluid collection behind her bladder suggesting no acute abscess, recent changes of  colitis, cystitis and worsening changes in the sacral decubitus pressure injury.   -As mentioned below after further discussion with healthcare power of attorney decision has remained to focus on full comfort care and not to pursuit any further intervention. -Antibiotic therapy has been discontinued, no further IV fluids and will just focus on symptom management. -will discharge to [name] for comfort and end of life care. -life expectancy less than 6 weeks.   Acute metabolic encephalopathy secondary to above: Per discussion with the patient's significant other the patient can normally converse coherently though she is bedbound/wheelchair bound.  This is likely secondary to sepsis. -Hold sedating medications and continue to monitor with frequent neurochecks -follow further discussion and rec's by palliative care -patient remains at a high risk for aspiration and is currently unable to take p.o.'s or following any commands. -After further discussion by palliative care with patient's healthcare power of attorney decision has been made not to pursued any further invasive therapy or artificial nutrition.  Will focus on comfort care and symptom management. -Anticipate discharge back home with hospice care in the next 24 to 48 hours.  AKI-improving; non-oliguric: Likely secondary to sepsis. -Renal function has improved with fluid resuscitation -Continue avoiding nephrotoxins -No further blood work anticipated. -IV fluids will be discontinued and will focus on comfort care only.  History of hyperthyroidism: This is on her problem list but she is not on any medication for it. -TSH WNL during this hospitalization.  Parkinson's disease, gated by dementia: Suspect patient has dementia with Lewy bodies. - Hold amantadine 100 mg twice daily -Hold carbidopa-levodopa half tablet 3 times daily -Given ongoing inability to take by mouth medications. -Plan  is to focus on comfort care and symptom  management only.  Pain/psych: -Hold lorazepam 0.5 mg twice daily as needed -Hold trazodone 25 mg nightly -Hold quetiapine 25 mg every morning and 75 mg nightly -Hold mirtazapine 7.5 mg nightly  -Given ongoing inability to take p.o.'s. -Plan is to focus on comfort care currently.   DVT prophylaxis: Heparin changed to SCDs due to thrombocytopenia Code Status: DNR Family Communication: Caretaker at bedside Disposition Plan: After further discussion by palliative care in anticipation of her poor prognosis, decision has been made to focus on full comfort care and pursuit hospice for symptom management.   At this moment waiting PASRR # to discharge to SNF. Patient accepted to [name].  Consultants:   Palliative care  Procedures:   None  Antimicrobials:   cefepime, and Flagyl 11/11---11/13  Vancomycin discontinued 11/11---11/12  unasyn 11/13>>11/14   Subjective: Seen and examined.  Remains comfortable and in no distress.  Caregiver at bedside and updated on her plans of care.  Objective: Vitals:   03/23/18 2049 03/23/18 2128 03/24/18 0523 03/25/18 0538  BP:  (!) 160/88 (!) 162/80 (!) 156/71  Pulse:  92 79 76  Resp:    12  Temp:  98.3 F (36.8 C) 98.1 F (36.7 C) 98.1 F (36.7 C)  TempSrc:  Oral Oral Axillary  SpO2: 94% 97% 98% 95%  Weight:      Height:        Intake/Output Summary (Last 24 hours) at 03/25/2018 1258 Last data filed at 03/25/2018 0700 Gross per 24 hour  Intake 0 ml  Output 600 ml  Net -600 ml   Filed Weights   03/21/18 0500 03/22/18 0500 03/23/18 0600  Weight: 45.9 kg 45.4 kg 46.6 kg    Examination: General exam: No fever, patient remains lethargic, no eating, not drinking, not talking or following commands.  She has been a spontaneously opening her eyes intermittently.   The rest of her physical exam has remained unchanged from prior examination on 03/24/2018; please see below for details. Respiratory system: Clear to auscultation.  Respiratory effort normal. Cardiovascular system:RRR. No murmurs, rubs, gallops. Gastrointestinal system: Abdomen is nondistended, soft and nontender. No organomegaly or masses felt. Normal bowel sounds heard. Central nervous system: Intermittently opening her eyes spontaneously; unable to follow commands, overall very spastic on exam. Extremities: No cyanosis, no clubbing. Skin: No petechiae; 2 unstageable pressure injuries appreciated in her sacrum/buttocks area and there is also deep tissue injury affecting both heels.  Lesions were present at time of admission.  No signs of superimposed infection.  No drainage. Psychiatry: Unable to assess given ongoing encephalopathy.   Data Reviewed: I have personally reviewed following labs and imaging studies  CBC: Recent Labs  Lab 03/20/18 1234 03/21/18 0337 03/22/18 0400  WBC 14.4* 11.8* 11.1*  NEUTROABS 12.8*  --   --   HGB 14.7 12.1 13.2  HCT 43.1 37.0 39.5  MCV 96.2 97.9 97.3  PLT 217 144* 546   Basic Metabolic Panel: Recent Labs  Lab 03/20/18 1234 03/21/18 0337 03/22/18 0400  NA 144 146* 142  K 3.8 3.6 3.2*  CL 109 117* 111  CO2 21* 23 23  GLUCOSE 243* 143* 109*  BUN 26* 27* 17  CREATININE 1.44* 0.97 0.59  CALCIUM 9.2 8.1* 8.3*   GFR: Estimated Creatinine Clearance: 35.8 mL/min (by C-G formula based on SCr of 0.59 mg/dL).   Liver Function Tests: Recent Labs  Lab 03/20/18 1234  AST 23  ALT 19  ALKPHOS 74  BILITOT 1.2  PROT 6.9  ALBUMIN 3.7   Coagulation Profile: Recent Labs  Lab 03/20/18 1234  INR 1.30   Sepsis Labs: Recent Labs  Lab 03/20/18 1234 03/20/18 1255 03/20/18 1431 03/21/18 0337 03/22/18 0400 03/23/18 0426  PROCALCITON  --   --   --  3.09 1.66 0.76  LATICACIDVEN 2.1* 2.62* 1.2  --  0.9  --     Recent Results (from the past 240 hour(s))  Culture, blood (Routine x 2)     Status: None   Collection Time: 03/20/18 12:35 PM  Result Value Ref Range Status   Specimen Description BLOOD RIGHT  FOREARM  Final   Special Requests   Final    BOTTLES DRAWN AEROBIC AND ANAEROBIC Blood Culture adequate volume   Culture   Final    NO GROWTH 5 DAYS Performed at [facility], [address] [place], [place] [address]    Report Status 03/25/2018 FINAL  Final  Culture, blood (Routine x 2)     Status: None   Collection Time: 03/20/18  1:09 PM  Result Value Ref Range Status   Specimen Description BLOOD RIGHT HAND  Final   Special Requests   Final    BOTTLES DRAWN AEROBIC AND ANAEROBIC Blood Culture adequate volume   Culture   Final    NO GROWTH 5 DAYS Performed at [name], [address], [place], [place] [address]    Report Status 03/25/2018 FINAL  Final  MRSA PCR Screening     Status: None   Collection Time: 03/20/18  4:36 PM  Result Value Ref Range Status   MRSA by PCR NEGATIVE NEGATIVE Final    Comment:        The GeneXpert MRSA Assay (FDA approved for NASAL specimens only), is one component of a comprehensive MRSA colonization surveillance program. It is not intended to diagnose MRSA infection nor to guide or monitor treatment for MRSA infections. Performed at [facility], [address], [place], [place] [address]      Radiology Studies: No results found. Scheduled Meds: . Influenza vac split quadrivalent PF  0.5 mL Intramuscular Tomorrow-1000   Continuous Infusions:    LOS: 5 days    Time spent: 15 minutes   Barton Dubois, MD [facility] Pager [phone]  If 7PM-7AM, please contact night-coverage [email or web address] Password [email or web address] 03/25/2018, 12:58 PM

## 2018-03-26 NOTE — Progress Notes (Signed)
PROGRESS NOTE    Catherine [name]  OMV:[id number] DOB: 18-May-1927 DOA: 03/20/2018 PCP: Etter Sjogren, DO   Brief Narrative:   Catherine [name] is a 84 y.o. female with medical history significant of Parkinson's disease with associated dementia, anxiety/depression, recurrent UTI who presents from nursing home with altered mental status and found to have sepsis possibly from a urinary source.  Patient cannot provide any history due to her current medical condition and mental state.  She was admitted with sepsis secondary to UTI, but urine cultures did not appear to have been collected.  She was started empirically on cefepime, metronidazole, and vancomycin.  She continues to remain quite somnolent and sedating agents have been withheld.  She was also noted to have AKI which is improving.  She is also noted to have sacral skin breakdown which has been assessed by wound care nurse.  Assessment & Plan:   Principal Problem:   Sepsis ([place]) Active Problems:   Type 2 diabetes mellitus (HCC)   PAT (paroxysmal atrial tachycardia) (HCC)   Parkinson's disease (HCC)   Dementia in Parkinson's disease ([place])   Altered mental status   UTI (urinary tract infection)   Generalized weakness   Pressure injury of both heels, unstageable ([place])   Hypothyroidism   Sepsis due to urinary tract infection ([place])   Goals of care, counseling/discussion   Palliative care encounter   Sepsis: Unspecified source -No urine culture taken -Cystitis and inflammatory changes appreciated on CT scan -Patient has received treatment with vancomycin, cefepime and Flagyl; with subsequently antibiotics narrowed to Unasyn. -Patient is afebrile and otherwise hemodynamically stable. --MRSA swab negative, so vancomycin discontinued - Blood culture has remained without any growth. -Follow-up CT abdomen pelvis with contrast demonstrated improvement in fluid collection behind her bladder suggesting no acute abscess, recent changes of  colitis, cystitis and worsening changes in the sacral decubitus pressure injury.   -As mentioned below after further discussion with healthcare power of attorney decision has remained to focus on full comfort care and not to pursuit any further intervention. -Antibiotic therapy has been discontinued, no further IV fluids and will just focus on symptom management. -will discharge to [facility] for comfort and end of life care. -life expectancy less than 6 weeks.   Acute metabolic encephalopathy secondary to above: Per discussion with the patient's significant other the patient can normally converse coherently though she is bedbound/wheelchair bound.  This is likely secondary to sepsis. -Hold sedating medications and continue to monitor with frequent neurochecks -follow further discussion and rec's by palliative care -patient remains at a high risk for aspiration and is currently unable to take p.o.'s or following any commands. -After further discussion by palliative care with patient's healthcare power of attorney decision has been made not to pursued any further invasive therapy or artificial nutrition.  Will focus on comfort care and symptom management. -Anticipate discharge back home with hospice care in the next 24 to 48 hours.  AKI-improving; non-oliguric: Likely secondary to sepsis. -Renal function has improved with fluid resuscitation -Continue avoiding nephrotoxins -No further blood work anticipated. -IV fluids will be discontinued and will focus on comfort care only.  History of hyperthyroidism: This is on her problem list but she is not on any medication for it. -TSH WNL during this hospitalization.  Parkinson's disease, gated by dementia: Suspect patient has dementia with Lewy bodies. - Hold amantadine 100 mg twice daily -Hold carbidopa-levodopa half tablet 3 times daily -Given ongoing inability to take by mouth medications. -Plan  is to focus on comfort care and symptom  management only.  Pain/psych: -Hold lorazepam 0.5 mg twice daily as needed -Hold trazodone 25 mg nightly -Hold quetiapine 25 mg every morning and 75 mg nightly -Hold mirtazapine 7.5 mg nightly  -Given ongoing inability to take p.o.'s. -Plan is to focus on comfort care currently.   DVT prophylaxis: SCDs Code Status: DNR Family Communication: Healthcare power of attorney at bedside. Disposition Plan: After further discussion by palliative care in anticipation of her poor prognosis, decision has been made to focus on full comfort care and pursuit hospice for symptom management.   At this moment waiting PASRR # to discharge to SNF. Patient accepted to [facility].  Consultants:   Palliative care  Procedures:   None  Antimicrobials:   cefepime, and Flagyl 11/11---11/13  Vancomycin discontinued 11/11---11/12  unasyn 11/13>>11/14   Subjective: Afebrile.  Comfortable and in no acute distress.  No overnight events reported.  Objective: Vitals:   03/24/18 0523 03/25/18 0538 03/25/18 2044 03/26/18 0526  BP: (!) 162/80 (!) 156/71  (!) 161/85  Pulse: 79 76  83  Resp:  12  14  Temp: 98.1 F (36.7 C) 98.1 F (36.7 C)  98 F (36.7 C)  TempSrc: Oral Axillary  Oral  SpO2: 98% 95% 95% 98%  Weight:      Height:        Intake/Output Summary (Last 24 hours) at 03/26/2018 1311 Last data filed at 03/25/2018 1700 Gross per 24 hour  Intake 0 ml  Output -  Net 0 ml   Filed Weights   03/21/18 0500 03/22/18 0500 03/23/18 0600  Weight: 45.9 kg 45.4 kg 46.6 kg    Examination: General exam: Awake, comfortable, not talking, unable to follow commands, no eating or drinking.  Patient is afebrile. Respiratory system: Clear to auscultation. Respiratory effort normal. Cardiovascular system:RRR. No murmurs, rubs, gallops. Gastrointestinal system: Abdomen is nondistended, soft and nontender. No organomegaly or masses felt. Normal bowel sounds heard. Central nervous system: Alert and  oriented. No focal neurological deficits. Extremities: No cyanosis or clubbing. Skin: No petechiae; 2 unstageable pressure injuries appreciated in her sacrum/buttocks area and there is also deep tissue injury affecting both heels.   Data Reviewed: I have personally reviewed following labs and imaging studies  CBC: Recent Labs  Lab 03/20/18 1234 03/21/18 0337 03/22/18 0400  WBC 14.4* 11.8* 11.1*  NEUTROABS 12.8*  --   --   HGB 14.7 12.1 13.2  HCT 43.1 37.0 39.5  MCV 96.2 97.9 97.3  PLT 217 144* 852   Basic Metabolic Panel: Recent Labs  Lab 03/20/18 1234 03/21/18 0337 03/22/18 0400  NA 144 146* 142  K 3.8 3.6 3.2*  CL 109 117* 111  CO2 21* 23 23  GLUCOSE 243* 143* 109*  BUN 26* 27* 17  CREATININE 1.44* 0.97 0.59  CALCIUM 9.2 8.1* 8.3*   GFR: Estimated Creatinine Clearance: 35.8 mL/min (by C-G formula based on SCr of 0.59 mg/dL).   Liver Function Tests: Recent Labs  Lab 03/20/18 1234  AST 23  ALT 19  ALKPHOS 74  BILITOT 1.2  PROT 6.9  ALBUMIN 3.7   Coagulation Profile: Recent Labs  Lab 03/20/18 1234  INR 1.30   Sepsis Labs: Recent Labs  Lab 03/20/18 1234 03/20/18 1255 03/20/18 1431 03/21/18 0337 03/22/18 0400 03/23/18 0426  PROCALCITON  --   --   --  3.09 1.66 0.76  LATICACIDVEN 2.1* 2.62* 1.2  --  0.9  --     Recent  Results (from the past 240 hour(s))  Culture, blood (Routine x 2)     Status: None   Collection Time: 03/20/18 12:35 PM  Result Value Ref Range Status   Specimen Description BLOOD RIGHT FOREARM  Final   Special Requests   Final    BOTTLES DRAWN AEROBIC AND ANAEROBIC Blood Culture adequate volume   Culture   Final    NO GROWTH 5 DAYS Performed at [facility], [address] [place], [place] [address]    Report Status 03/25/2018 FINAL  Final  Culture, blood (Routine x 2)     Status: None   Collection Time: 03/20/18  1:09 PM  Result Value Ref Range Status   Specimen Description BLOOD RIGHT HAND  Final   Special Requests    Final    BOTTLES DRAWN AEROBIC AND ANAEROBIC Blood Culture adequate volume   Culture   Final    NO GROWTH 5 DAYS Performed at [facility], [address] [place], [place] [address]    Report Status 03/25/2018 FINAL  Final  MRSA PCR Screening     Status: None   Collection Time: 03/20/18  4:36 PM  Result Value Ref Range Status   MRSA by PCR NEGATIVE NEGATIVE Final    Comment:        The GeneXpert MRSA Assay (FDA approved for NASAL specimens only), is one component of a comprehensive MRSA colonization surveillance program. It is not intended to diagnose MRSA infection nor to guide or monitor treatment for MRSA infections. Performed at [facility], [address] [place], [place] [address]      Radiology Studies: No results found. Scheduled Meds: . Influenza vac split quadrivalent PF  0.5 mL Intramuscular Tomorrow-1000   Continuous Infusions:    LOS: 6 days    Time spent: 15 minutes   Barton Dubois, MD [facility] Pager [phone]  If 7PM-7AM, please contact night-coverage [email or web address] Password [email or web address] 03/26/2018, 1:11 PM

## 2018-03-27 ENCOUNTER — Encounter: Payer: Self-pay | Admitting: Internal Medicine

## 2018-03-27 ENCOUNTER — Non-Acute Institutional Stay (SKILLED_NURSING_FACILITY): Payer: Medicare Other | Admitting: Internal Medicine

## 2018-03-27 ENCOUNTER — Inpatient Hospital Stay
Admission: RE | Admit: 2018-03-27 | Discharge: 2018-04-09 | Disposition: E | Payer: Medicare Other | Source: Ambulatory Visit | Attending: Internal Medicine | Admitting: Internal Medicine

## 2018-03-27 DIAGNOSIS — G20 Parkinson's disease: Secondary | ICD-10-CM

## 2018-03-27 DIAGNOSIS — R4182 Altered mental status, unspecified: Secondary | ICD-10-CM | POA: Diagnosis not present

## 2018-03-27 DIAGNOSIS — N390 Urinary tract infection, site not specified: Secondary | ICD-10-CM

## 2018-03-27 DIAGNOSIS — I10 Essential (primary) hypertension: Secondary | ICD-10-CM | POA: Diagnosis not present

## 2018-03-27 DIAGNOSIS — A419 Sepsis, unspecified organism: Secondary | ICD-10-CM

## 2018-03-27 DIAGNOSIS — F0280 Dementia in other diseases classified elsewhere without behavioral disturbance: Secondary | ICD-10-CM

## 2018-03-27 LAB — GLUCOSE, CAPILLARY: Glucose-Capillary: 102 mg/dL — ABNORMAL HIGH (ref 70–99)

## 2018-03-27 NOTE — Clinical Social Work Placement (Signed)
   CLINICAL SOCIAL WORK PLACEMENT  NOTE  Date:  03/16/2018  Patient Details  Name: Catherine [name] MRN: [id number] Date of Birth: 1927-11-20  Clinical Social Work is seeking post-discharge placement for this patient at the [place] level of care (*CSW will initial, date and re-position this form in  chart as items are completed):  Yes   Patient/family provided with [place] Work Department's list of facilities offering this level of care within the geographic area requested by the patient (or if unable, by the patient's family).  Yes   Patient/family informed of their freedom to choose among providers that offer the needed level of care, that participate in Medicare, Medicaid or managed care program needed by the patient, have an available bed and are willing to accept the patient.  Yes   Patient/family informed of [facility]'s ownership interest in [facility] and [facility], as well as of the fact that they are under no obligation to receive care at these facilities.  PASRR submitted to EDS on 03/24/18     PASRR number received on 03/13/2018     Existing PASRR number confirmed on       FL2 transmitted to all facilities in geographic area requested by pt/family on 03/24/18     FL2 transmitted to all facilities within larger geographic area on       Patient informed that his/her managed care company has contracts with or will negotiate with certain facilities, including the following:        Yes   Patient/family informed of bed offers received.  Patient chooses bed at [facility]     Physician recommends and patient chooses bed at      Patient to be transferred to [facility] on 04/05/2018.  Patient to be transferred to facility by hospital bed     Patient family notified on 03/17/2018 of transfer.  Name of family member notified:  [facility]     PHYSICIAN       Additional Comment:     _______________________________________________ Shade Flood, LCSW 03/13/2018, 11:33 AM

## 2018-03-27 NOTE — Care Management Important Message (Signed)
Important Message  Patient Details  Name: Catherine [name] MRN: [id number] Date of Birth: 1927-08-26   Medicare Important Message Given:  Yes    Sherald Barge, RN 03/28/2018, 2:42 PM

## 2018-03-27 NOTE — Clinical Social Work Note (Signed)
LCSW following. Pt has been assigned her PASRR number and can transition to [facility] if MD feels she is stable for transfer. Updated MD, RN, Marianna Fuss at [facility], and pt's POA. POA remains in agreement with the plan. RN will call report and assist with transfer. There are no other CSW needs at this time.

## 2018-03-27 NOTE — Progress Notes (Signed)
Patient discharged home today per MD orders. Patient vital signs WDL. IV removed and site WDL. Discharge Instructions including follow up appointments, medications, and education reviewed with nurse Renae. Patient verbalizes understanding. Patient is transported to [facility] via bed.

## 2018-03-27 NOTE — Progress Notes (Signed)
Chart reviewed and patient assessment completed. Patient somnolent. Does not wake to voice or follow commands. Does not appear to be in pain or discomfort. Regular, shallow respirations. Caregiver at bedside. Discussed plan for possible discharge to [facility] today with hospice services. PMT contact information given and encouraged caregiver to call with questions or concerns.   NO CHARGE  Ihor Dow, [place], FNP-C Palliative Medicine Team  Phone: [phone] Fax: [phone]

## 2018-03-27 NOTE — Progress Notes (Signed)
Location:    [facility] Room Number: 142/W Place of Service:  SNF [phone]) Provider:  Granville Lewis PA-C  Etter Sjogren, DO  Patient Care Team: Etter Sjogren, DO as PCP - General [facility] Medicine)  Extended Emergency Contact Information Primary Emergency Contact: [place], [place] [address] Johnnette Litter of [place] Phone: [phone] Mobile Phone: [phone] Relation: Other  Code Status:  DNR Goals of care: Advanced Directive information Advanced Directives 03/16/2018  Does Patient Have a Medical Advance Directive? Yes  Type of Advance Directive Out of facility DNR (pink MOST or yellow form)  Does patient want to make changes to medical advance directive? No - Patient declined  Copy of [place] in Chart? -  Would patient like information on creating a medical advance directive? -  Pre-existing out of facility DNR order (yellow form or pink MOST form) -     Chief Complaint  Patient presents with  . Hospitalization Follow-up    Hospitalization F/U Visit  Status post hospitalization for sepsis now under comfort care measures  HPI:  Pt is a 84 y.o. female seen today for hospital follow-up- status post hospitalization for sepsis.   Patient has a history of type 2 diabetes as well as atrial tachycardia Parkinson's with dementia-as well as hypothyroidism although not on any medication.  She presented from her assisted living center with altered mental status and found to have sepsis possibly from a urinary source.  Apparently previously she became progressively weaker and confused and initially went to an ER and was diagnosed with UTI.  She came back to the nursing home became progressively more confused and nonresponsive febrile and tachycardic and was sent to the ER apparently [facility]   Her creatinine was elevated at 1.44 fever was 104.7 lactate was mildly elevated white blood cell count was 14.4  with left shift- chest x-ray did not show any acute changes she was treated with cephapirin Flagyl and vancomycin.  CT scan did show cystitis and inflammatory changes.  Her antibiotics were narrowed to Unasyn.--  Blood culture was negative.  CT follow-up of the abdomen and pelvis showed improvement in fluid collection suggesting no acute abscess or worsening changes in the sacral decubitus pressure injury that she had.  Emphasis was decided for full comfort care per discussion with her healthcare power of attorney-and thus her antibiotic was discontinued with no further IV fluids and hospitalization focus on symptom management and comfort  She currently is not taking anything by mouth and she is essentially here for end-of-life care and symptomatic management with recommendation for hospice referral  Of note her renal function did improve with fluid resuscitation with recommendation for no further blood work and IV fluids have been discontinued.  Apparently hypothyroidism is listed on her problem list but she has not been on any medication TSH was normal in the hospital.  In regards to Parkinson's disease with dementia this thought to be dementia with Lewy bodies-again she has been started on comfort care.  She also has deep tissue injury affecting her sacrum and heels bilaterally recommendation is to continue preventive measures antibiotics have been discontinued no superimposed infection was thought present.  Currently she is lying in bed she does not appear to be in any pain she is not really speaking- she does have a Haldol order as needed every 4 hours at this point does not appear to be needed as well as  Ativan twice a day as needed she does receive Roxanol 5 mg every 2 hours as needed for pain also continues on Zofran as needed for any nausea.  She also has a Tylenol suppository if needed.     Past Medical History:  Diagnosis Date  . Anemia   . Arthritis   . Cancer ([place])     basal cell-forehead  . Decubitus ulcer    sacral  . Dementia ([place])   . Dementia in Parkinson's disease ([place]) 12/25/2015  . Diabetes mellitus without complication ([place])   . Dyslipidemia   . Hyperlipidemia   . Hypertension   . Migraine without aura, without mention of intractable migraine without mention of status migrainosus   . NSTEMI (non-ST elevated myocardial infarction) ([place])   . Paralysis agitans ([place]) 09/20/2012  . Parkinson's disease ([place])   . Sciatica of left side   . SVT (supraventricular tachycardia) ([place])   . Tremor    Past Surgical History:  Procedure Laterality Date  . APPENDECTOMY    . Arthroscopic surgery     Left knee  . basal cell carcinoma resection     Forehead  . CATARACT EXTRACTION W/PHACO Right 11/19/2013   Procedure: CATARACT EXTRACTION PHACO AND INTRAOCULAR LENS PLACEMENT (IOC);  Surgeon: Tonny Branch, MD;  Location: [facility];  Service: Ophthalmology;  Laterality: Right;  CDE:  12.95  . CATARACT EXTRACTION W/PHACO Left 12/17/2013   Procedure: CATARACT EXTRACTION PHACO AND INTRAOCULAR LENS PLACEMENT (IOC);  Surgeon: Tonny Branch, MD;  Location: [facility];  Service: Ophthalmology;  Laterality: Left;  CDE 12.10  . HIP PINNING,CANNULATED Left 05/23/2015   Procedure: INTERNAL FIXATION LEFT HIP;  Surgeon: Carole Civil, MD;  Location: [facility];  Service: Orthopedics;  Laterality: Left;  . MOUTH SURGERY    . TONSILLECTOMY    . trigger finger surgery     Bilateral thumb    Allergies  Allergen Reactions  . Sulfa Antibiotics Rash  . Azilect [Rasagiline] Rash  . Fenofibrate Rash  . Other     baza cream   . Sinemet [Carbidopa-Levodopa] Rash  . Tape Rash and Other (See Comments)    Redness  . Tomato Rash    Pt can eat raw tomatoes.Marland Kitchenate tomato sandwich a few days ago    Allergies as of 03/19/2018      Reactions   Sulfa Antibiotics Rash   Azilect [rasagiline] Rash   Fenofibrate Rash   Other    baza cream   Sinemet [carbidopa-levodopa] Rash   Tape Rash, Other  (See Comments)   Redness   Tomato Rash   Pt can eat raw tomatoes.Marland Kitchenate tomato sandwich a few days ago      Medication List        Accurate as of 04/06/2018  4:02 PM. Always use your most recent med list.          acetaminophen 650 MG suppository Commonly known as:  TYLENOL Place 1 suppository (650 mg total) rectally every 6 (six) hours as needed for mild pain (or Fever >/= 101).   bisacodyl 10 MG suppository Commonly known as:  DULCOLAX Place 1 suppository (10 mg total) rectally daily as needed for moderate constipation.   glycopyrrolate 0.2 MG/ML injection Commonly known as:  ROBINUL Inject 1 mL (0.2 mg total) into the skin every 4 (four) hours as needed (excessive secretions).   haloperidol 2 MG/ML solution Commonly known as:  HALDOL Place 0.3 mLs (0.6 mg total) under the tongue every 4 (four) hours as needed for agitation (  or delirium).   LORazepam 0.5 MG tablet Commonly known as:  ATIVAN Take 0.5 mg by mouth 2 (two) times daily.   morphine CONCENTRATE 10 MG/0.5ML Soln concentrated solution Place 0.25 mLs (5 mg total) under the tongue every 2 (two) hours as needed for severe pain or shortness of breath.   ondansetron 8 MG disintegrating tablet Commonly known as:  ZOFRAN-ODT Take 1 tablet (8 mg total) by mouth every 8 (eight) hours as needed for nausea or vomiting.       Review of Systems   Unobtainable second patient not speaking  Immunization History  Administered Date(s) Administered  . PPD Test 12/22/2014   Pertinent  Health Maintenance Due  Topic Date Due  . INFLUENZA VACCINE  04/26/2018 (Originally 12/08/2017)  . FOOT EXAM  04/26/2018 (Originally 01/08/1938)  . HEMOGLOBIN A1C  04/26/2018 (Originally 08/03/2017)  . OPHTHALMOLOGY EXAM  04/26/2018 (Originally 01/08/1938)  . URINE MICROALBUMIN  04/26/2018 (Originally 01/08/1938)  . DEXA SCAN  04/26/2018 (Originally 01/08/1995)  . PNA vac Low Risk Adult (1 of 2 - PCV13) 04/26/2018 (Originally 01/08/1995)   Fall  Risk  07/15/2016  Falls in the past year? No   Functional Status Survey:    Vitals:   03/21/2018 1602  BP: (!) 170/93  Pulse: 79  Resp: 20  Temp: 97.6 F (36.4 C)  TempSrc: Oral  SpO2: 97%  There is no height or weight on file to calculate BMI. Physical Exam  Later manual blood pressure was taken and was 160/62.  In general this is a frail elderly female she is not really responsive she does follow movement with her eyes somewhat.  Her skin is warm and dry she does have wounds on her heels bilaterally left greater than right as well as a fairly large sacral wound there is no odor or drainage apparent at this point.  Eyes pupils do appear reactive sclera and conjunctive are clear she does react to stimuli.  Oropharynx mucous membranes are dry.  Chest is clear to auscultation she is not following verbal commands cannot appreciate any overt congestion or labored breathing however.  Heart is largely regular rate and rhythm with an occasional irregular beat she does not have significant lower extremity edema.  Abdomen is soft nontender with slightly hypoactive bowel sounds.  Musculoskeletal difficult to do full exam since she is not following verbal commands has general frailty does not move her extremities on command holds her arms and somewhat of a contracted position she does have some contractures of her fingers bilaterally.  Neurologic as noted above she is not really responding to any verbal commands she does open her eyes is not speaking.  Psych as noted above.      Labs reviewed: Recent Labs    03/20/18 1234 03/21/18 0337 03/22/18 0400  NA 144 146* 142  K 3.8 3.6 3.2*  CL 109 117* 111  CO2 21* 23 23  GLUCOSE 243* 143* 109*  BUN 26* 27* 17  CREATININE 1.44* 0.97 0.59  CALCIUM 9.2 8.1* 8.3*   Recent Labs    01/23/18 1220 03/13/18 1831 03/20/18 1234  AST 16 17 23   ALT 5 7 19   ALKPHOS 61 67 74  BILITOT 0.9 1.1 1.2  PROT 6.6 7.4 6.9  ALBUMIN 4.1 4.4 3.7     Recent Labs    11/24/17 1352 01/23/18 1220  03/20/18 1234 03/21/18 0337 03/22/18 0400  WBC 7.2 9.5   < > 14.4* 11.8* 11.1*  NEUTROABS 4.7 7.2  --  12.8*  --   --  HGB 14.7 13.2   < > 14.7 12.1 13.2  HCT 43.6 39.2   < > 43.1 37.0 39.5  MCV 98.2 98.7   < > 96.2 97.9 97.3  PLT 142* 185   < > 217 144* 153   < > = values in this interval not displayed.   Lab Results  Component Value Date   TSH 0.981 03/20/2018   Lab Results  Component Value Date   HGBA1C 5.8 (H) 02/03/2017   Lab Results  Component Value Date   CHOL 161 12/22/2014   HDL 36 (L) 12/22/2014   LDLCALC 110 (H) 12/22/2014   TRIG 73 12/22/2014   CHOLHDL 4.5 12/22/2014    Significant Diagnostic Results in last 30 days:  Ct Abdomen Pelvis Wo Contrast  Result Date: 03/20/2018 CLINICAL DATA:  84 year old female with fever and UTI. Prior appendectomy. Initial encounter. EXAM: CT ABDOMEN AND PELVIS WITHOUT CONTRAST TECHNIQUE: Multidetector CT imaging of the abdomen and pelvis was performed following the standard protocol without IV contrast. COMPARISON:  No comparison CT. FINDINGS: Lower chest: Small granulomas lung bases/right middle lobe. Scarring/subsegmental atelectasis. Heart size within normal limits. Coronary artery calcification. Calcified aortic valve. Dense breast parenchyma. Hepatobiliary: Taking into account limitation by non contrast imaging, no worrisome hepatic lesion. Evaluation of gallbladder limited by streak artifact from patient's arm. No calcified gallstones noted. Pancreas: Taking into account limitation by non contrast imaging, no worrisome pancreatic lesion. Spleen: Taking into account limitation by non contrast imaging, no splenic lesion or enlargement. Adrenals/Urinary Tract: No obstructing stone or hydronephrosis. Calcifications left kidney may represent nonobstructing renal calculi and/or vascular calcifications. Taking into account limitation by non contrast imaging, no worrisome renal lesion. Mild  hyperplasia adrenal glands bilaterally. No primary urinary bladder abnormality noted. Stomach/Bowel: Prominent stool rectosigmoid region patient at risk for stercoral colitis. Anterior to the stool filled prominent size rectosigmoid region is a gas and fluid collection which is impressing upon the posterior aspect of the bladder. Inferior extent at the expected level of the vagina and therefore this may be gas and fluid within the vagina. Direct visualization may prove helpful. Abscess felt to be a secondary less likely consideration. Vascular/Lymphatic: Atherosclerotic changes aorta with slight ectasia without aneurysm. Scattered normal size lymph nodes. Reproductive: As above.  No worrisome adnexal mass. Other: Mild haziness sacral region may represent sacral decubitus. Musculoskeletal: Degenerative changes L5-S1. IMPRESSION: 1. Anterior to the stool filled prominent size rectosigmoid region is a gas and fluid collection which is impressing upon the posterior aspect of the bladder. Inferior extent at the expected level of the vagina and therefore this may be gas and fluid within the vagina. Direct visualization may prove helpful. Abscess felt to be a secondary less likely consideration (this possibility can be re-evaluated on follow-up imaging if vaginal exam is not possible). 2. Mild sacral decubitus. 3. No obstructing stone or hydronephrosis. Calcifications left kidney may represent nonobstructing renal calculi or vascular calcifications. These results were called by telephone at the time of interpretation on 03/20/2018 at 3:55 pm to Dr. Nat Christen , who verbally acknowledged these results. Electronically Signed   By: Genia Del M.D.   On: 03/20/2018 15:56   Ct Abdomen Pelvis W Contrast  Result Date: 03/21/2018 CLINICAL DATA:  84 year old female with abdominal pain. Recent UTI. Prior appendectomy. Subsequent encounter. EXAM: CT ABDOMEN AND PELVIS WITH CONTRAST TECHNIQUE: Multidetector CT imaging of the  abdomen and pelvis was performed using the standard protocol following bolus administration of intravenous contrast. CONTRAST:  141mL ISOVUE-300 IOPAMIDOL (  ISOVUE-300) INJECTION 61% COMPARISON:  03/20/2018 CT. FINDINGS: Lower chest: Small granulomas lung bases. Heart size within normal limits. Hepatobiliary: No worrisome hepatic lesion. No calcified gallstones or pericholecystic inflammation. Pancreas: No pancreatic mass or inflammation. Spleen: No splenic mass or enlargement. Adrenals/Urinary Tract: Nonobstructing left renal calculi measuring up to 9 mm. Parapelvic cysts. No renal or adrenal mass. Contracted urinary bladder with circumferential wall thickening. Mild mucosal enhancement may be normal although cannot exclude changes of mild cystitis. No mass identified. Stomach/Bowel: Mild circumferential wall thickening descending colon and sigmoid colon. Although this may be partially explained by under distension, possibility of colitis is raised. This represents a change from yesterday's exam. Moderate stool rectosigmoid region. Portions of the stomach, small bowel and colon are under distended limiting evaluation. Vascular/Lymphatic: Atherosclerotic changes aorta and aortic branch vessels. No abdominal aortic aneurysm or large vessel occlusion. Scattered normal size lymph nodes. Reproductive: Decrease in amount of gas and fluid within the cervical canal. The fact that this has decreased in size suggests this is not an abscess. Other: Increase in size of sacral decubitus with inflammatory extension to the sacrum. Musculoskeletal: Prominent degenerative changes L5-S1. Post left hip replacement. IMPRESSION: 1. Decrease in amount of gas and fluid within the cervical canal. The fact that this has decreased in size suggests this is not an abscess. 2. Increase in size of sacral decubitus (now moderate in size) with inflammatory extension to the sacrum. 3. Mild circumferential wall thickening descending colon and  sigmoid colon. Although this may be partially explained by under distension, possibility of colitis is raised. This represents a change from yesterday's exam. 4. Moderate stool rectosigmoid region. 5. Nonobstructing left renal calculi measuring up to 9 mm. 6. Contracted urinary bladder with circumferential wall thickening. Mild mucosal enhancement may be normal although cannot exclude changes of mild cystitis. 7.  Aortic Atherosclerosis (ICD10-I70.0). Electronically Signed   By: Genia Del M.D.   On: 03/21/2018 15:57   Dg Chest Port 1 View  Result Date: 03/20/2018 CLINICAL DATA:  Fever and urinary tract infection diagnosed yesterday. EXAM: PORTABLE CHEST 1 VIEW COMPARISON:  PA and lateral chest x-ray of March 13, 2018 FINDINGS: The lungs are borderline hypoinflated. The interstitial markings are coarse though stable allowing for the hypoinflation. There is a stable approximately 4 mm diameter calcified nodule in the right lower lung. The heart and pulmonary vascularity are normal. The mediastinum is normal in width. There is calcification in the wall of the aortic arch. The bony thorax exhibits no acute abnormality. IMPRESSION: Chronic bronchitic changes, stable. No alveolar pneumonia nor CHF. The appearance of the interstitial markings is accentuated by mild hypoinflation. Electronically Signed   By: David  [place] M.D.   On: 03/20/2018 13:26    Assessment/Plan  #1- history of sepsis with end-of-life care- again health care power of attorney does not wish any aggressive measures wishes symptomatic management comfort care- she is on minimal medications now antibiotics have been discontinued.  She does continue on Roxanol every 2 hours 5 mg as needed-she also has an order for Haldol and Ativan for anxiety or agitation this does not appear to be needed at this time.  She also has Tylenol suppository for fever.  And Zofran for any nausea.  At this point appears to be comfortable.  2.  In regards  to her other issues I do note her potassium was slightly low but again no further labs are desired- her other medications have been discontinued including Sinemet and amantadine with history of Parkinson's-  as well as Seroquel with apparent previous history of psychosis with Parkinson's dementia-  #3 hypertension-of initially when she came here systolic was 616 is come down to around 160 now-at this point will keep an eye on it but again no aggressive measures are desired.  Currently she appears comfortable again prognosis is thought to be less than 6 weeks- continue to monitor.  [id number]

## 2018-03-27 NOTE — Progress Notes (Signed)
Patient seen and examined. No events overnight and with stable VS. Patient is comfortable and in no acute distress. Discharge summary reviewed and appropriate. Ok to discharge to [facility] for further symptoms management and end of life care. Please refer to discharge summary from 03/24/18 for further details.  Barton Dubois MD [phone]

## 2018-03-28 ENCOUNTER — Encounter: Payer: Self-pay | Admitting: Internal Medicine

## 2018-03-28 ENCOUNTER — Non-Acute Institutional Stay (SKILLED_NURSING_FACILITY): Payer: Medicare Other | Admitting: Internal Medicine

## 2018-03-28 DIAGNOSIS — F0280 Dementia in other diseases classified elsewhere without behavioral disturbance: Secondary | ICD-10-CM

## 2018-03-28 DIAGNOSIS — G20 Parkinson's disease: Secondary | ICD-10-CM | POA: Diagnosis not present

## 2018-03-28 DIAGNOSIS — L89620 Pressure ulcer of left heel, unstageable: Secondary | ICD-10-CM

## 2018-03-28 DIAGNOSIS — L8945 Pressure ulcer of contiguous site of back, buttock and hip, unstageable: Secondary | ICD-10-CM

## 2018-03-28 DIAGNOSIS — L89610 Pressure ulcer of right heel, unstageable: Secondary | ICD-10-CM | POA: Diagnosis not present

## 2018-03-28 NOTE — Progress Notes (Signed)
Provider: Veleta Miners MD  Location:    [place] Room Number: 142/W Place of Service:  SNF (31)  PCP: Etter Sjogren, DO Patient Care Team: Etter Sjogren, DO as PCP - General [facility])  Extended Emergency Contact Information Primary Emergency Contact: [place], [place] [address] [address] of [place] Phone: [phone] Mobile Phone: [phone] Relation: Other  Code Status: DNR Goals of Care: Advanced Directive information Advanced Directives 03/28/2018  Does Patient Have a Medical Advance Directive? Yes  Type of Advance Directive Out of facility DNR (pink MOST or yellow form)  Does patient want to make changes to medical advance directive? No - Patient declined  Copy of [place] in Chart? -  Would patient like information on creating a medical advance directive? -  Pre-existing out of facility DNR order (yellow form or pink MOST form) -      Chief Complaint  Patient presents with  . New Admit To SNF    New Admission Visit    HPI: Patient is a 84 y.o. female seen today for admission to SNF for End of life Care.  Patient has h/o Parkinson disease , Dementia, Hypertension and Depression who resides in [place] and was dependent for her ADLS. She was send to the hospital from her facility due to being Lethargic. She was found to be septic with Fever, Tachycardia and Leucocytosis. She was treated for UTi though I don't see any Urine Cultures. CT scan of Abdomen showed ? Colitis and worsening Sacral Pressure Wound. Since her Mental status did not improve her POA decided to make her Comfort care. Patient now is in SNF for End of life Care She was unable to give me any history. She would not open her eyes or follow any Commands/ She was not responding to her name.  She does not look like she is any Pain.  Past Medical History:  Diagnosis Date  . Anemia   . Arthritis   . Cancer ([place])    basal cell-forehead  . Decubitus ulcer    sacral  . Dementia ([place])   . Dementia in Parkinson's disease ([place]) 12/25/2015  . Diabetes mellitus without complication ([place])   . Dyslipidemia   . Hyperlipidemia   . Hypertension   . Migraine without aura, without mention of intractable migraine without mention of status migrainosus   . NSTEMI (non-ST elevated myocardial infarction) ([place])   . Paralysis agitans ([place]) 09/20/2012  . Parkinson's disease ([place])   . Sciatica of left side   . SVT (supraventricular tachycardia) ([place])   . Tremor    Past Surgical History:  Procedure Laterality Date  . APPENDECTOMY    . Arthroscopic surgery     Left knee  . basal cell carcinoma resection     Forehead  . CATARACT EXTRACTION W/PHACO Right 11/19/2013   Procedure: CATARACT EXTRACTION PHACO AND INTRAOCULAR LENS PLACEMENT (IOC);  Surgeon: Tonny Branch, MD;  Location: [facility];  Service: Ophthalmology;  Laterality: Right;  CDE:  12.95  . CATARACT EXTRACTION W/PHACO Left 12/17/2013   Procedure: CATARACT EXTRACTION PHACO AND INTRAOCULAR LENS PLACEMENT (IOC);  Surgeon: Tonny Branch, MD;  Location: [facility];  Service: Ophthalmology;  Laterality: Left;  CDE 12.10  . HIP PINNING,CANNULATED Left 05/23/2015   Procedure: INTERNAL FIXATION LEFT HIP;  Surgeon: Carole Civil, MD;  Location: [facility];  Service: Orthopedics;  Laterality: Left;  . MOUTH SURGERY    .  TONSILLECTOMY    . trigger finger surgery     Bilateral thumb    reports that she has never smoked. She has never used smokeless tobacco. She reports that she does not drink alcohol or use drugs. Social History   Socioeconomic History  . Marital status: Widowed    Spouse name: Not on file  . Number of children: 0  . Years of education: Not on file  . Highest education level: Not on file  Occupational History  . Occupation: Retired    Comment: [employer]  Social Needs  . Financial resource strain: Not on file  . Food insecurity:    Worry: Not on file      Inability: Not on file  . Transportation needs:    Medical: Not on file    Non-medical: Not on file  Tobacco Use  . Smoking status: Never Smoker  . Smokeless tobacco: Never Used  Substance and Sexual Activity  . Alcohol use: No    Alcohol/week: 0.0 standard drinks  . Drug use: No  . Sexual activity: Not on file  Lifestyle  . Physical activity:    Days per week: Not on file    Minutes per session: Not on file  . Stress: Not on file  Relationships  . Social connections:    Talks on phone: Not on file    Gets together: Not on file    Attends religious service: Not on file    Active member of club or organization: Not on file    Attends meetings of clubs or organizations: Not on file    Relationship status: Not on file  . Intimate partner violence:    Fear of current or ex partner: Not on file    Emotionally abused: Not on file    Physically abused: Not on file    Forced sexual activity: Not on file  Other Topics Concern  . Not on file  Social History Narrative   Resides at [place] Status Survey:    Family History  Problem Relation Age of Onset  . Rheum arthritis Mother   . Heart attack Father        not certain of COD  . Hypertension Paternal Grandfather   . Stroke Paternal Grandfather   . Cirrhosis Paternal Grandmother     Health Maintenance  Topic Date Due  . INFLUENZA VACCINE  04/26/2018 (Originally 12/08/2017)  . FOOT EXAM  04/26/2018 (Originally 01/08/1938)  . HEMOGLOBIN A1C  04/26/2018 (Originally 08/03/2017)  . OPHTHALMOLOGY EXAM  04/26/2018 (Originally 01/08/1938)  . URINE MICROALBUMIN  04/26/2018 (Originally 01/08/1938)  . DEXA SCAN  04/26/2018 (Originally 01/08/1995)  . TETANUS/TDAP  04/26/2018 (Originally 01/07/1947)  . PNA vac Low Risk Adult (1 of 2 - PCV13) 04/26/2018 (Originally 01/08/1995)    Allergies  Allergen Reactions  . Sulfa Antibiotics Rash  . Azilect [Rasagiline] Rash  . Fenofibrate Rash  . Other     baza  cream   . Sinemet [Carbidopa-Levodopa] Rash  . Tape Rash and Other (See Comments)    Redness  . Tomato Rash    Pt can eat raw tomatoes.Marland Kitchenate tomato sandwich a few days ago    Allergies as of 03/28/2018      Reactions   Sulfa Antibiotics Rash   Azilect [rasagiline] Rash   Fenofibrate Rash   Other    baza cream   Sinemet [carbidopa-levodopa] Rash   Tape Rash, Other (See Comments)   Redness   Tomato Rash  Pt can eat raw tomatoes.Marland Kitchenate tomato sandwich a few days ago      Medication List        Accurate as of 03/28/18  9:41 AM. Always use your most recent med list.          acetaminophen 650 MG suppository Commonly known as:  TYLENOL Place 1 suppository (650 mg total) rectally every 6 (six) hours as needed for mild pain (or Fever >/= 101).   bisacodyl 10 MG suppository Commonly known as:  DULCOLAX Place 1 suppository (10 mg total) rectally daily as needed for moderate constipation.   glycopyrrolate 0.2 MG/ML injection Commonly known as:  ROBINUL Inject 1 mL (0.2 mg total) into the skin every 4 (four) hours as needed (excessive secretions).   haloperidol 2 MG/ML solution Commonly known as:  HALDOL Place 0.3 mLs (0.6 mg total) under the tongue every 4 (four) hours as needed for agitation (or delirium).   LORazepam 0.5 MG tablet Commonly known as:  ATIVAN Take 0.5 mg by mouth 2 (two) times daily.   morphine CONCENTRATE 10 MG/0.5ML Soln concentrated solution Place 0.25 mLs (5 mg total) under the tongue every 2 (two) hours as needed for severe pain or shortness of breath.   ondansetron 8 MG disintegrating tablet Commonly known as:  ZOFRAN-ODT Take 1 tablet (8 mg total) by mouth every 8 (eight) hours as needed for nausea or vomiting.       Review of Systems  Unable to perform ROS: Patient unresponsive    Vitals:   03/28/18 0940  BP: (!) 172/80  Pulse: 82  Resp: 20  Temp: 98 F (36.7 C)  TempSrc: Oral   There is no height or weight on file to calculate  BMI. Physical Exam  Constitutional: She appears well-developed.  HENT:  Head: Normocephalic.  Eyes: Pupils are equal, round, and reactive to light.  Cardiovascular: Normal rate and regular rhythm.  Pulmonary/Chest: Effort normal and breath sounds normal. No stridor. No respiratory distress.  Abdominal: Soft. Bowel sounds are normal. She exhibits no distension. There is tenderness.  Musculoskeletal: She exhibits no edema.  Neurological:  Patient not alert or responding  Skin:  Patient has pressure wounds on Both LE and Sacral area. Unstageable.Witth Eschar and discharge with Odor.  Psychiatric:  Could Not assess    Labs reviewed: Basic Metabolic Panel: Recent Labs    03/20/18 1234 03/21/18 0337 03/22/18 0400  NA 144 146* 142  K 3.8 3.6 3.2*  CL 109 117* 111  CO2 21* 23 23  GLUCOSE 243* 143* 109*  BUN 26* 27* 17  CREATININE 1.44* 0.97 0.59  CALCIUM 9.2 8.1* 8.3*   Liver Function Tests: Recent Labs    01/23/18 1220 03/13/18 1831 03/20/18 1234  AST 16 17 23   ALT 5 7 19   ALKPHOS 61 67 74  BILITOT 0.9 1.1 1.2  PROT 6.6 7.4 6.9  ALBUMIN 4.1 4.4 3.7   No results for input(s): LIPASE, AMYLASE in the last 8760 hours. Recent Labs    01/23/18 1220  AMMONIA 14   CBC: Recent Labs    11/24/17 1352 01/23/18 1220  03/20/18 1234 03/21/18 0337 03/22/18 0400  WBC 7.2 9.5   < > 14.4* 11.8* 11.1*  NEUTROABS 4.7 7.2  --  12.8*  --   --   HGB 14.7 13.2   < > 14.7 12.1 13.2  HCT 43.6 39.2   < > 43.1 37.0 39.5  MCV 98.2 98.7   < > 96.2 97.9 97.3  PLT 142*  185   < > 217 144* 153   < > = values in this interval not displayed.   Cardiac Enzymes: Recent Labs    05/09/17 1241 11/24/17 1352 03/13/18 1828  TROPONINI <0.03 <0.03 <0.03   BNP: Invalid input(s): POCBNP Lab Results  Component Value Date   HGBA1C 5.8 (H) 02/03/2017   Lab Results  Component Value Date   TSH 0.981 03/20/2018   Lab Results  Component Value Date   VITAMINB12 233 12/22/2014   No  results found for: FOLATE No results found for: IRON, TIBC, FERRITIN  Imaging and Procedures obtained prior to SNF admission: Ct Abdomen Pelvis Wo Contrast  Result Date: 03/20/2018 CLINICAL DATA:  84 year old female with fever and UTI. Prior appendectomy. Initial encounter. EXAM: CT ABDOMEN AND PELVIS WITHOUT CONTRAST TECHNIQUE: Multidetector CT imaging of the abdomen and pelvis was performed following the standard protocol without IV contrast. COMPARISON:  No comparison CT. FINDINGS: Lower chest: Small granulomas lung bases/right middle lobe. Scarring/subsegmental atelectasis. Heart size within normal limits. Coronary artery calcification. Calcified aortic valve. Dense breast parenchyma. Hepatobiliary: Taking into account limitation by non contrast imaging, no worrisome hepatic lesion. Evaluation of gallbladder limited by streak artifact from patient's arm. No calcified gallstones noted. Pancreas: Taking into account limitation by non contrast imaging, no worrisome pancreatic lesion. Spleen: Taking into account limitation by non contrast imaging, no splenic lesion or enlargement. Adrenals/Urinary Tract: No obstructing stone or hydronephrosis. Calcifications left kidney may represent nonobstructing renal calculi and/or vascular calcifications. Taking into account limitation by non contrast imaging, no worrisome renal lesion. Mild hyperplasia adrenal glands bilaterally. No primary urinary bladder abnormality noted. Stomach/Bowel: Prominent stool rectosigmoid region patient at risk for stercoral colitis. Anterior to the stool filled prominent size rectosigmoid region is a gas and fluid collection which is impressing upon the posterior aspect of the bladder. Inferior extent at the expected level of the vagina and therefore this may be gas and fluid within the vagina. Direct visualization may prove helpful. Abscess felt to be a secondary less likely consideration. Vascular/Lymphatic: Atherosclerotic changes aorta  with slight ectasia without aneurysm. Scattered normal size lymph nodes. Reproductive: As above.  No worrisome adnexal mass. Other: Mild haziness sacral region may represent sacral decubitus. Musculoskeletal: Degenerative changes L5-S1. IMPRESSION: 1. Anterior to the stool filled prominent size rectosigmoid region is a gas and fluid collection which is impressing upon the posterior aspect of the bladder. Inferior extent at the expected level of the vagina and therefore this may be gas and fluid within the vagina. Direct visualization may prove helpful. Abscess felt to be a secondary less likely consideration (this possibility can be re-evaluated on follow-up imaging if vaginal exam is not possible). 2. Mild sacral decubitus. 3. No obstructing stone or hydronephrosis. Calcifications left kidney may represent nonobstructing renal calculi or vascular calcifications. These results were called by telephone at the time of interpretation on 03/20/2018 at 3:55 pm to Dr. Nat Christen , who verbally acknowledged these results. Electronically Signed   By: Genia Del M.D.   On: 03/20/2018 15:56   Ct Abdomen Pelvis W Contrast  Result Date: 03/21/2018 CLINICAL DATA:  84 year old female with abdominal pain. Recent UTI. Prior appendectomy. Subsequent encounter. EXAM: CT ABDOMEN AND PELVIS WITH CONTRAST TECHNIQUE: Multidetector CT imaging of the abdomen and pelvis was performed using the standard protocol following bolus administration of intravenous contrast. CONTRAST:  120mL ISOVUE-300 IOPAMIDOL (ISOVUE-300) INJECTION 61% COMPARISON:  03/20/2018 CT. FINDINGS: Lower chest: Small granulomas lung bases. Heart size within normal limits. Hepatobiliary: No  worrisome hepatic lesion. No calcified gallstones or pericholecystic inflammation. Pancreas: No pancreatic mass or inflammation. Spleen: No splenic mass or enlargement. Adrenals/Urinary Tract: Nonobstructing left renal calculi measuring up to 9 mm. Parapelvic cysts. No renal or  adrenal mass. Contracted urinary bladder with circumferential wall thickening. Mild mucosal enhancement may be normal although cannot exclude changes of mild cystitis. No mass identified. Stomach/Bowel: Mild circumferential wall thickening descending colon and sigmoid colon. Although this may be partially explained by under distension, possibility of colitis is raised. This represents a change from yesterday's exam. Moderate stool rectosigmoid region. Portions of the stomach, small bowel and colon are under distended limiting evaluation. Vascular/Lymphatic: Atherosclerotic changes aorta and aortic branch vessels. No abdominal aortic aneurysm or large vessel occlusion. Scattered normal size lymph nodes. Reproductive: Decrease in amount of gas and fluid within the cervical canal. The fact that this has decreased in size suggests this is not an abscess. Other: Increase in size of sacral decubitus with inflammatory extension to the sacrum. Musculoskeletal: Prominent degenerative changes L5-S1. Post left hip replacement. IMPRESSION: 1. Decrease in amount of gas and fluid within the cervical canal. The fact that this has decreased in size suggests this is not an abscess. 2. Increase in size of sacral decubitus (now moderate in size) with inflammatory extension to the sacrum. 3. Mild circumferential wall thickening descending colon and sigmoid colon. Although this may be partially explained by under distension, possibility of colitis is raised. This represents a change from yesterday's exam. 4. Moderate stool rectosigmoid region. 5. Nonobstructing left renal calculi measuring up to 9 mm. 6. Contracted urinary bladder with circumferential wall thickening. Mild mucosal enhancement may be normal although cannot exclude changes of mild cystitis. 7.  Aortic Atherosclerosis (ICD10-I70.0). Electronically Signed   By: Genia Del M.D.   On: 03/21/2018 15:57   Dg Chest Port 1 View  Result Date: 03/20/2018 CLINICAL DATA:   Fever and urinary tract infection diagnosed yesterday. EXAM: PORTABLE CHEST 1 VIEW COMPARISON:  PA and lateral chest x-ray of March 13, 2018 FINDINGS: The lungs are borderline hypoinflated. The interstitial markings are coarse though stable allowing for the hypoinflation. There is a stable approximately 4 mm diameter calcified nodule in the right lower lung. The heart and pulmonary vascularity are normal. The mediastinum is normal in width. There is calcification in the wall of the aortic arch. The bony thorax exhibits no acute abnormality. IMPRESSION: Chronic bronchitic changes, stable. No alveolar pneumonia nor CHF. The appearance of the interstitial markings is accentuated by mild hypoinflation. Electronically Signed   By: David  Martinique M.D.   On: 03/20/2018 13:26    Assessment/Plan  Patient with End Stage Parkinson, Dementia , Sepsis and now Unsteageble Pressure wounds Will continue on Ativan and Roxanol as needed for comfort Care Continue Care for her Ulcers D/W the Wound care nurse    Family/ staff Communication:   Labs/tests ordered: Total time spent in this patient care encounter was 45_ minutes; greater than 50% of the visit reviewing records , Labs and coordinating care for problems addressed at this encounter.

## 2018-03-30 ENCOUNTER — Other Ambulatory Visit: Payer: Self-pay

## 2018-03-30 MED ORDER — LORAZEPAM 0.5 MG PO TABS: 0.5000 mg | ORAL_TABLET | Freq: Two times a day (BID) | ORAL | 0 refills | Status: AC

## 2018-03-30 NOTE — Telephone Encounter (Signed)
RX Fax for [facility]@ [phone]  

## 2018-04-04 ENCOUNTER — Encounter: Payer: Self-pay | Admitting: Internal Medicine

## 2018-04-04 ENCOUNTER — Non-Acute Institutional Stay (SKILLED_NURSING_FACILITY): Payer: Medicare Other | Admitting: Internal Medicine

## 2018-04-04 DIAGNOSIS — L8945 Pressure ulcer of contiguous site of back, buttock and hip, unstageable: Secondary | ICD-10-CM | POA: Diagnosis not present

## 2018-04-04 DIAGNOSIS — G20 Parkinson's disease: Secondary | ICD-10-CM

## 2018-04-04 NOTE — Progress Notes (Signed)
Location:    [facility] Room Number: 142/W Place of Service:  SNF [phone]) Provider:  Veleta Miners MD  Etter Sjogren, DO  Patient Care Team: Etter Sjogren, DO as PCP - General [facility])  Extended Emergency Contact Information Primary Emergency Contact: [place], [place] [address] Johnnette Litter of [place] Phone: [phone] Mobile Phone: [phone] Relation: Other  Code Status:  DNR Goals of care: Advanced Directive information Advanced Directives 04/04/2018  Does Patient Have a Medical Advance Directive? Yes  Type of Advance Directive Out of facility DNR (pink MOST or yellow form)  Does patient want to make changes to medical advance directive? No - Patient declined  Copy of [place] in Chart? -  Would patient like information on creating a medical advance directive? -  Pre-existing out of facility DNR order (yellow form or pink MOST form) -     Chief Complaint  Patient presents with  . Acute Visit    End of life visit    HPI:  Pt is a 83 y.o. female seen today for an acute visit for End of Life Care  Patient has h/o Parkinson disease , Dementia, Hypertension and Depression who resides in [place] and was dependent for her ADLS. She was send to the hospital from her facility due to being Lethargic. She was found to be septic with Fever, Tachycardia and Leucocytosis. She was treated for UTI CT scan of Abdomen showed ? Colitis and worsening Sacral Pressure Wound. Since her Mental status did not improve her POA decided to make her Comfort care. Patient now is in SNF for End of life Care . She did spike fever few days ago but has been otherwise stable .  She is eating well. And seems Comfortable. She does not talk. Did open her eyes.   Past Medical History:  Diagnosis Date  . Anemia   . Arthritis   . Cancer ([place])    basal cell-forehead  . Decubitus ulcer    sacral  . Dementia ([place])     . Dementia in Parkinson's disease ([place]) 12/25/2015  . Diabetes mellitus without complication ([place])   . Dyslipidemia   . Hyperlipidemia   . Hypertension   . Migraine without aura, without mention of intractable migraine without mention of status migrainosus   . NSTEMI (non-ST elevated myocardial infarction) ([place])   . Paralysis agitans ([place]) 09/20/2012  . Parkinson's disease ([place])   . Sciatica of left side   . SVT (supraventricular tachycardia) ([place])   . Tremor    Past Surgical History:  Procedure Laterality Date  . APPENDECTOMY    . Arthroscopic surgery     Left knee  . basal cell carcinoma resection     Forehead  . CATARACT EXTRACTION W/PHACO Right 11/19/2013   Procedure: CATARACT EXTRACTION PHACO AND INTRAOCULAR LENS PLACEMENT (IOC);  Surgeon: Tonny Branch, MD;  Location: [facility];  Service: Ophthalmology;  Laterality: Right;  CDE:  12.95  . CATARACT EXTRACTION W/PHACO Left 12/17/2013   Procedure: CATARACT EXTRACTION PHACO AND INTRAOCULAR LENS PLACEMENT (IOC);  Surgeon: Tonny Branch, MD;  Location: [facility];  Service: Ophthalmology;  Laterality: Left;  CDE 12.10  . HIP PINNING,CANNULATED Left 05/23/2015   Procedure: INTERNAL FIXATION LEFT HIP;  Surgeon: Carole Civil, MD;  Location: [facility];  Service: Orthopedics;  Laterality: Left;  . MOUTH SURGERY    . TONSILLECTOMY    . trigger finger surgery  Bilateral thumb    Allergies  Allergen Reactions  . Sulfa Antibiotics Rash  . Azilect [Rasagiline] Rash  . Fenofibrate Rash  . Other     baza cream   . Sinemet [Carbidopa-Levodopa] Rash  . Tape Rash and Other (See Comments)    Redness  . Tomato Rash    Pt can eat raw tomatoes.Marland Kitchenate tomato sandwich a few days ago    Outpatient Encounter Medications as of 04/04/2018  Medication Sig  . acetaminophen (TYLENOL) 650 MG suppository Place 1 suppository (650 mg total) rectally every 6 (six) hours as needed for mild pain (or Fever >/= 101).  . bisacodyl (DULCOLAX) 10 MG suppository Place  1 suppository (10 mg total) rectally daily as needed for moderate constipation.  . collagenase (SANTYL) ointment Apply 1 application topically daily. Apply to wound on right buttocks and left buttocks as ordered per tx orders.  Marland Kitchen glycopyrrolate (ROBINUL) 0.2 MG/ML injection Inject 1 mL (0.2 mg total) into the skin every 4 (four) hours as needed (excessive secretions).  . LORazepam (ATIVAN) 0.5 MG tablet Take 1 tablet (0.5 mg total) by mouth 2 (two) times daily.  . Morphine Sulfate (MORPHINE CONCENTRATE) 10 MG/0.5ML SOLN concentrated solution Place 0.25 mLs (5 mg total) under the tongue every 2 (two) hours as needed for severe pain or shortness of breath.  . ondansetron (ZOFRAN ODT) 8 MG disintegrating tablet Take 1 tablet (8 mg total) by mouth every 8 (eight) hours as needed for nausea or vomiting.  . [DISCONTINUED] haloperidol (HALDOL) 2 MG/ML solution Place 0.3 mLs (0.6 mg total) under the tongue every 4 (four) hours as needed for agitation (or delirium).   No facility-administered encounter medications on file as of 04/04/2018.      Review of Systems  Unable to perform ROS: Other    Immunization History  Administered Date(s) Administered  . PPD Test 12/22/2014   Pertinent  Health Maintenance Due  Topic Date Due  . INFLUENZA VACCINE  04/26/2018 (Originally 12/08/2017)  . FOOT EXAM  04/26/2018 (Originally 01/08/1938)  . HEMOGLOBIN A1C  04/26/2018 (Originally 08/03/2017)  . OPHTHALMOLOGY EXAM  04/26/2018 (Originally 01/08/1938)  . URINE MICROALBUMIN  04/26/2018 (Originally 01/08/1938)  . DEXA SCAN  04/26/2018 (Originally 01/08/1995)  . PNA vac Low Risk Adult (1 of 2 - PCV13) 04/26/2018 (Originally 01/08/1995)   Fall Risk  07/15/2016  Falls in the past year? No   Functional Status Survey:    Vitals:   04/04/18 1049  BP: (!) 142/78  Pulse: 71  Resp: 18  Temp: 98.9 F (37.2 C)  TempSrc: Oral  SpO2: 93%   There is no height or weight on file to calculate BMI. Physical Exam   Constitutional: She appears well-developed.  HENT:  Head: Normocephalic.  Eyes: Pupils are equal, round, and reactive to light.  Cardiovascular: Normal rate and regular rhythm.  Pulmonary/Chest: Effort normal and breath sounds normal. No stridor. No respiratory distress.  Abdominal: Soft. Bowel sounds are normal. She exhibits no distension. There is no tenderness.  Musculoskeletal: She exhibits no edema.  Neurological:  Patient not alert or responding  Skin:  Patient has pressure wounds on Both LE and Sacral area. Unstageable.Witth Eschar and discharge with Odor.  Psychiatric:  Could Not assess    Labs reviewed: Recent Labs    03/20/18 1234 03/21/18 0337 03/22/18 0400  NA 144 146* 142  K 3.8 3.6 3.2*  CL 109 117* 111  CO2 21* 23 23  GLUCOSE 243* 143* 109*  BUN 26* 27* 17  CREATININE 1.44* 0.97 0.59  CALCIUM 9.2 8.1* 8.3*   Recent Labs    01/23/18 1220 03/13/18 1831 03/20/18 1234  AST 16 17 23   ALT 5 7 19   ALKPHOS 61 67 74  BILITOT 0.9 1.1 1.2  PROT 6.6 7.4 6.9  ALBUMIN 4.1 4.4 3.7   Recent Labs    11/24/17 1352 01/23/18 1220  03/20/18 1234 03/21/18 0337 03/22/18 0400  WBC 7.2 9.5   < > 14.4* 11.8* 11.1*  NEUTROABS 4.7 7.2  --  12.8*  --   --   HGB 14.7 13.2   < > 14.7 12.1 13.2  HCT 43.6 39.2   < > 43.1 37.0 39.5  MCV 98.2 98.7   < > 96.2 97.9 97.3  PLT 142* 185   < > 217 144* 153   < > = values in this interval not displayed.   Lab Results  Component Value Date   TSH 0.981 03/20/2018   Lab Results  Component Value Date   HGBA1C 5.8 (H) 02/03/2017   Lab Results  Component Value Date   CHOL 161 12/22/2014   HDL 36 (L) 12/22/2014   LDLCALC 110 (H) 12/22/2014   TRIG 73 12/22/2014   CHOLHDL 4.5 12/22/2014    Significant Diagnostic Results in last 30 days:  Ct Abdomen Pelvis Wo Contrast  Result Date: 03/20/2018 CLINICAL DATA:  84 year old female with fever and UTI. Prior appendectomy. Initial encounter. EXAM: CT ABDOMEN AND PELVIS WITHOUT  CONTRAST TECHNIQUE: Multidetector CT imaging of the abdomen and pelvis was performed following the standard protocol without IV contrast. COMPARISON:  No comparison CT. FINDINGS: Lower chest: Small granulomas lung bases/right middle lobe. Scarring/subsegmental atelectasis. Heart size within normal limits. Coronary artery calcification. Calcified aortic valve. Dense breast parenchyma. Hepatobiliary: Taking into account limitation by non contrast imaging, no worrisome hepatic lesion. Evaluation of gallbladder limited by streak artifact from patient's arm. No calcified gallstones noted. Pancreas: Taking into account limitation by non contrast imaging, no worrisome pancreatic lesion. Spleen: Taking into account limitation by non contrast imaging, no splenic lesion or enlargement. Adrenals/Urinary Tract: No obstructing stone or hydronephrosis. Calcifications left kidney may represent nonobstructing renal calculi and/or vascular calcifications. Taking into account limitation by non contrast imaging, no worrisome renal lesion. Mild hyperplasia adrenal glands bilaterally. No primary urinary bladder abnormality noted. Stomach/Bowel: Prominent stool rectosigmoid region patient at risk for stercoral colitis. Anterior to the stool filled prominent size rectosigmoid region is a gas and fluid collection which is impressing upon the posterior aspect of the bladder. Inferior extent at the expected level of the vagina and therefore this may be gas and fluid within the vagina. Direct visualization may prove helpful. Abscess felt to be a secondary less likely consideration. Vascular/Lymphatic: Atherosclerotic changes aorta with slight ectasia without aneurysm. Scattered normal size lymph nodes. Reproductive: As above.  No worrisome adnexal mass. Other: Mild haziness sacral region may represent sacral decubitus. Musculoskeletal: Degenerative changes L5-S1. IMPRESSION: 1. Anterior to the stool filled prominent size rectosigmoid region  is a gas and fluid collection which is impressing upon the posterior aspect of the bladder. Inferior extent at the expected level of the vagina and therefore this may be gas and fluid within the vagina. Direct visualization may prove helpful. Abscess felt to be a secondary less likely consideration (this possibility can be re-evaluated on follow-up imaging if vaginal exam is not possible). 2. Mild sacral decubitus. 3. No obstructing stone or hydronephrosis. Calcifications left kidney may represent nonobstructing renal calculi or vascular calcifications. These results  were called by telephone at the time of interpretation on 03/20/2018 at 3:55 pm to Dr. Nat Christen , who verbally acknowledged these results. Electronically Signed   By: Genia Del M.D.   On: 03/20/2018 15:56   Dg Chest 2 View  Result Date: 03/13/2018 CLINICAL DATA:  Altered mental status today. EXAM: CHEST - 2 VIEW COMPARISON:  01/23/2018 and prior exams FINDINGS: The cardiomediastinal silhouette is unremarkable. There is no evidence of focal airspace disease, pulmonary edema, suspicious pulmonary nodule/mass, pleural effusion, or pneumothorax. No acute bony abnormalities are identified. IMPRESSION: No active cardiopulmonary disease. Electronically Signed   By: Margarette Canada M.D.   On: 03/13/2018 19:22   Ct Head Wo Contrast  Result Date: 03/13/2018 CLINICAL DATA:  Altered mental status EXAM: CT HEAD WITHOUT CONTRAST TECHNIQUE: Contiguous axial images were obtained from the base of the skull through the vertex without intravenous contrast. COMPARISON:  CT brain 01/23/2018 FINDINGS: Brain: No acute territorial infarction, hemorrhage or intracranial mass. Moderate atrophy. Moderate small vessel ischemic changes of the white matter. Stable ventricle size. Vascular: No hyperdense vessels. Carotid vascular and vertebral artery calcification Skull: Normal. Negative for fracture or focal lesion. Sinuses/Orbits: No acute finding. Other: None IMPRESSION:  1. No CT evidence for acute intracranial abnormality. 2. Atrophy with small vessel ischemic changes of the white matter. Electronically Signed   By: Donavan Foil M.D.   On: 03/13/2018 19:52   Ct Abdomen Pelvis W Contrast  Result Date: 03/21/2018 CLINICAL DATA:  84 year old female with abdominal pain. Recent UTI. Prior appendectomy. Subsequent encounter. EXAM: CT ABDOMEN AND PELVIS WITH CONTRAST TECHNIQUE: Multidetector CT imaging of the abdomen and pelvis was performed using the standard protocol following bolus administration of intravenous contrast. CONTRAST:  1104mL ISOVUE-300 IOPAMIDOL (ISOVUE-300) INJECTION 61% COMPARISON:  03/20/2018 CT. FINDINGS: Lower chest: Small granulomas lung bases. Heart size within normal limits. Hepatobiliary: No worrisome hepatic lesion. No calcified gallstones or pericholecystic inflammation. Pancreas: No pancreatic mass or inflammation. Spleen: No splenic mass or enlargement. Adrenals/Urinary Tract: Nonobstructing left renal calculi measuring up to 9 mm. Parapelvic cysts. No renal or adrenal mass. Contracted urinary bladder with circumferential wall thickening. Mild mucosal enhancement may be normal although cannot exclude changes of mild cystitis. No mass identified. Stomach/Bowel: Mild circumferential wall thickening descending colon and sigmoid colon. Although this may be partially explained by under distension, possibility of colitis is raised. This represents a change from yesterday's exam. Moderate stool rectosigmoid region. Portions of the stomach, small bowel and colon are under distended limiting evaluation. Vascular/Lymphatic: Atherosclerotic changes aorta and aortic branch vessels. No abdominal aortic aneurysm or large vessel occlusion. Scattered normal size lymph nodes. Reproductive: Decrease in amount of gas and fluid within the cervical canal. The fact that this has decreased in size suggests this is not an abscess. Other: Increase in size of sacral decubitus  with inflammatory extension to the sacrum. Musculoskeletal: Prominent degenerative changes L5-S1. Post left hip replacement. IMPRESSION: 1. Decrease in amount of gas and fluid within the cervical canal. The fact that this has decreased in size suggests this is not an abscess. 2. Increase in size of sacral decubitus (now moderate in size) with inflammatory extension to the sacrum. 3. Mild circumferential wall thickening descending colon and sigmoid colon. Although this may be partially explained by under distension, possibility of colitis is raised. This represents a change from yesterday's exam. 4. Moderate stool rectosigmoid region. 5. Nonobstructing left renal calculi measuring up to 9 mm. 6. Contracted urinary bladder with circumferential wall thickening. Mild mucosal  enhancement may be normal although cannot exclude changes of mild cystitis. 7.  Aortic Atherosclerosis (ICD10-I70.0). Electronically Signed   By: Genia Del M.D.   On: 03/21/2018 15:57   Dg Chest Port 1 View  Result Date: 03/20/2018 CLINICAL DATA:  Fever and urinary tract infection diagnosed yesterday. EXAM: PORTABLE CHEST 1 VIEW COMPARISON:  PA and lateral chest x-ray of March 13, 2018 FINDINGS: The lungs are borderline hypoinflated. The interstitial markings are coarse though stable allowing for the hypoinflation. There is a stable approximately 4 mm diameter calcified nodule in the right lower lung. The heart and pulmonary vascularity are normal. The mediastinum is normal in width. There is calcification in the wall of the aortic arch. The bony thorax exhibits no acute abnormality. IMPRESSION: Chronic bronchitic changes, stable. No alveolar pneumonia nor CHF. The appearance of the interstitial markings is accentuated by mild hypoinflation. Electronically Signed   By: David  [place] M.D.   On: 03/20/2018 13:26    Assessment/Plan  Patient with End Stage Parkinson, Dementia , Sepsis and now Unsteageble Pressure wounds Continue Ativan  and Roxanol for comfort care Santyl for Wounds Will try Magic cup for Protein intake.   Family/ staff Communication:   Labs/tests ordered:   Total time spent in this patient care encounter was 25_ minutes; greater than 50% of the visit spent counseling patient, reviewing records , Labs and coordinating care for problems addressed at this encounter.

## 2018-04-09 DEATH — deceased

## 2018-07-27 ENCOUNTER — Ambulatory Visit: Payer: Medicare Other | Admitting: Nurse Practitioner
